# Patient Record
Sex: Male | Born: 1950 | Race: Black or African American | Hispanic: No | Marital: Married | Smoking: Former smoker
Health system: Southern US, Community
[De-identification: ages and names within clinical notes are randomized; demographics above are authoritative.]

## PROBLEM LIST (undated history)

## (undated) DIAGNOSIS — G3184 Mild cognitive impairment, so stated: Secondary | ICD-10-CM

## (undated) DIAGNOSIS — M545 Low back pain, unspecified: Secondary | ICD-10-CM

## (undated) DIAGNOSIS — I517 Cardiomegaly: Secondary | ICD-10-CM

## (undated) DIAGNOSIS — M5020 Other cervical disc displacement, unspecified cervical region: Secondary | ICD-10-CM

## (undated) DIAGNOSIS — K635 Polyp of colon: Secondary | ICD-10-CM

## (undated) DIAGNOSIS — I10 Essential (primary) hypertension: Secondary | ICD-10-CM

## (undated) DIAGNOSIS — R7303 Prediabetes: Secondary | ICD-10-CM

## (undated) DIAGNOSIS — I499 Cardiac arrhythmia, unspecified: Secondary | ICD-10-CM

## (undated) DIAGNOSIS — M48061 Spinal stenosis, lumbar region without neurogenic claudication: Secondary | ICD-10-CM

## (undated) DIAGNOSIS — N200 Calculus of kidney: Secondary | ICD-10-CM

## (undated) DIAGNOSIS — M47812 Spondylosis without myelopathy or radiculopathy, cervical region: Secondary | ICD-10-CM

## (undated) DIAGNOSIS — Z972 Presence of dental prosthetic device (complete) (partial): Secondary | ICD-10-CM

## (undated) DIAGNOSIS — G4730 Sleep apnea, unspecified: Secondary | ICD-10-CM

## (undated) DIAGNOSIS — W57XXXA Bitten or stung by nonvenomous insect and other nonvenomous arthropods, initial encounter: Secondary | ICD-10-CM

## (undated) DIAGNOSIS — E119 Type 2 diabetes mellitus without complications: Secondary | ICD-10-CM

## (undated) DIAGNOSIS — G4733 Obstructive sleep apnea (adult) (pediatric): Secondary | ICD-10-CM

## (undated) DIAGNOSIS — K259 Gastric ulcer, unspecified as acute or chronic, without hemorrhage or perforation: Secondary | ICD-10-CM

## (undated) DIAGNOSIS — C61 Malignant neoplasm of prostate: Secondary | ICD-10-CM

## (undated) DIAGNOSIS — N400 Enlarged prostate without lower urinary tract symptoms: Secondary | ICD-10-CM

## (undated) DIAGNOSIS — I639 Cerebral infarction, unspecified: Secondary | ICD-10-CM

## (undated) DIAGNOSIS — G8929 Other chronic pain: Secondary | ICD-10-CM

## (undated) DIAGNOSIS — I2510 Atherosclerotic heart disease of native coronary artery without angina pectoris: Secondary | ICD-10-CM

## (undated) DIAGNOSIS — B029 Zoster without complications: Secondary | ICD-10-CM

## (undated) DIAGNOSIS — J302 Other seasonal allergic rhinitis: Secondary | ICD-10-CM

## (undated) DIAGNOSIS — E7800 Pure hypercholesterolemia, unspecified: Secondary | ICD-10-CM

## (undated) DIAGNOSIS — E559 Vitamin D deficiency, unspecified: Secondary | ICD-10-CM

## (undated) HISTORY — DX: Pure hypercholesterolemia, unspecified: E78.00

## (undated) HISTORY — DX: Other seasonal allergic rhinitis: J30.2

## (undated) HISTORY — DX: Low back pain, unspecified: M54.50

## (undated) HISTORY — DX: Spinal stenosis, lumbar region without neurogenic claudication: M48.061

## (undated) HISTORY — DX: Other cervical disc displacement, unspecified cervical region: M50.20

## (undated) HISTORY — DX: Calculus of kidney: N20.0

## (undated) HISTORY — DX: Vitamin D deficiency, unspecified: E55.9

## (undated) HISTORY — PX: HERNIA REPAIR: SHX51

## (undated) HISTORY — DX: Cardiac arrhythmia, unspecified: I49.9

## (undated) HISTORY — DX: Mild cognitive impairment of uncertain or unknown etiology: G31.84

## (undated) HISTORY — PX: ANKLE SURGERY: SHX546

## (undated) HISTORY — DX: Gastric ulcer, unspecified as acute or chronic, without hemorrhage or perforation: K25.9

## (undated) HISTORY — DX: Obstructive sleep apnea (adult) (pediatric): G47.33

## (undated) HISTORY — DX: Cerebral infarction, unspecified: I63.9

## (undated) HISTORY — DX: Type 2 diabetes mellitus without complications: E11.9

## (undated) HISTORY — DX: Spondylosis without myelopathy or radiculopathy, cervical region: M47.812

## (undated) HISTORY — DX: Zoster without complications: B02.9

## (undated) HISTORY — DX: Polyp of colon: K63.5

## (undated) HISTORY — DX: Other chronic pain: G89.29

---

## 1998-05-05 ENCOUNTER — Ambulatory Visit (HOSPITAL_COMMUNITY): Admission: RE | Admit: 1998-05-05 | Discharge: 1998-05-05 | Payer: Self-pay | Admitting: Neurosurgery

## 1998-05-19 ENCOUNTER — Encounter: Admission: RE | Admit: 1998-05-19 | Discharge: 1998-08-17 | Payer: Self-pay | Admitting: Neurosurgery

## 2003-05-24 ENCOUNTER — Encounter (HOSPITAL_BASED_OUTPATIENT_CLINIC_OR_DEPARTMENT_OTHER): Payer: Self-pay | Admitting: General Surgery

## 2003-05-28 ENCOUNTER — Ambulatory Visit (HOSPITAL_COMMUNITY): Admission: RE | Admit: 2003-05-28 | Discharge: 2003-05-28 | Payer: Self-pay | Admitting: General Surgery

## 2003-12-24 ENCOUNTER — Encounter: Admission: RE | Admit: 2003-12-24 | Discharge: 2003-12-24 | Payer: Self-pay | Admitting: Internal Medicine

## 2005-06-22 ENCOUNTER — Encounter: Admission: RE | Admit: 2005-06-22 | Discharge: 2005-06-22 | Payer: Self-pay | Admitting: Internal Medicine

## 2005-09-21 ENCOUNTER — Observation Stay (HOSPITAL_COMMUNITY): Admission: EM | Admit: 2005-09-21 | Discharge: 2005-09-22 | Payer: Self-pay | Admitting: Emergency Medicine

## 2005-09-21 ENCOUNTER — Encounter (INDEPENDENT_AMBULATORY_CARE_PROVIDER_SITE_OTHER): Payer: Self-pay | Admitting: Cardiology

## 2005-10-09 ENCOUNTER — Ambulatory Visit (HOSPITAL_COMMUNITY): Admission: RE | Admit: 2005-10-09 | Discharge: 2005-10-09 | Payer: Self-pay | Admitting: Cardiology

## 2006-01-04 ENCOUNTER — Encounter: Admission: RE | Admit: 2006-01-04 | Discharge: 2006-01-04 | Payer: Self-pay | Admitting: Cardiology

## 2006-10-18 ENCOUNTER — Ambulatory Visit (HOSPITAL_COMMUNITY): Admission: RE | Admit: 2006-10-18 | Discharge: 2006-10-18 | Payer: Self-pay | Admitting: Neurosurgery

## 2006-12-13 ENCOUNTER — Ambulatory Visit (HOSPITAL_COMMUNITY): Admission: RE | Admit: 2006-12-13 | Discharge: 2006-12-13 | Payer: Self-pay | Admitting: Internal Medicine

## 2008-03-26 ENCOUNTER — Encounter: Admission: RE | Admit: 2008-03-26 | Discharge: 2008-03-26 | Payer: Self-pay | Admitting: Internal Medicine

## 2011-01-07 ENCOUNTER — Encounter: Payer: Self-pay | Admitting: Internal Medicine

## 2011-05-04 NOTE — Cardiovascular Report (Signed)
John Finley, John Finley NO.:  [id number]   MEDICAL RECORD NO.:  [id number]          PATIENT TYPE:  INP   LOCATION:  2912                         FACILITY:  [facility]   PHYSICIAN:  Cristy Hilts. Jacinto Halim, MD       DATE OF BIRTH:  1951/03/11   DATE OF PROCEDURE:  09/21/2005  DATE OF DISCHARGE:                              CARDIAC CATHETERIZATION   PROCEDURE PERFORMED:  1.  Left ventriculography.  2.  Selective right and left coronary arteriography.  3.  Abdominal aortogram and selective renal arteriography.  4.  Right femoral angiography and closure of right femoral artery access      using StarClose.   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  His risk factors included his  age and hypertension.  Because of abnormal EKG and also ongoing chest  discomfort, he was brought to the cardiac catheterization lab for definitive  diagnosis of coronary disease.  He also has hyperlipidemia.   HEMODYNAMIC DATA:  The left ventricular pressure was 95/0 with end-diastolic  pressure of 7 mmHg.  The aortic pressure was 95/61 with a mean of 73 mmHg.  There was no pressure gradient across the aortic valve.   ANGIOGRAPHIC DATA:  Left ventricle.  Left ventricular systolic function was  super normal with ejection fraction of 70%.  There was no significant mitral  regurgitation.   Right coronary artery.  Right coronary artery was a large caliber vessel and  a dominant vessel.  It has gotten mild to moderate diffuse luminal  irregularity, especially in the proximal segment.  The proximal segment has  40% narrowing, proximal to mid segment has a 40-50% narrowing at the origin  of the conus and RV branches.  The distal RCA has mild luminal irregularity  and gives rise into a  large PDA and a large PLA.   Left main.  Left main is a large caliber  vessel.  It is normal.   Left anterior descending artery.  Left anterior descending artery is a large  caliber vessel.  It has got mild luminal irregularity.  It gives rise into a  moderate to large sized diagonal-1 and several small diagonals.  It ends at  the apex.   Ramus intermedius.  The ramus intermedius is a very large caliber vessel and  is normal.   Circumflex coronary artery.  Circumflex coronary artery is a small to  moderate caliber vessel.  There is a mid 50-60% stenosis.  The mid segment  has moderate luminal irregularity.   Abdominal aortogram.  Abdominal aortogram revealed the presence of 2 renal  arteries, one on either side, that were not well visualized because of  minimal amount of contrast used.  Selective renal angiography revealed  widely patent renal arteries, one on either side.   IMPRESSION:  1.  No significant coronary artery disease by cardiac catheterization.      Intermediate lesions in the circumflex and also in the right coronary  artery.  The mid circumflex has 50% stenosis, but is a small caliber      vessel 50 to at most 60%, but is a small caliber vessel.  The right      coronary artery in the proximal to mid segment has moderate diffuse      luminal irregularity and has a diffuse 50, at most 60%, stenosis.  There      is also mild luminal irregularity in its distal right coronary artery.  2.  Dynamic left ventricle with ejection fraction of 70%.  No significant      mitral regurgitation.  3.  Widely patent renal arteries.   RECOMMENDATION:  Continue primary prevention, as indicated, with aggressive  control of his lipids with an LDL goal of less than or equal to 70 and also  the HDL goal of equal to or greater than 40-45 is indicated.  He will be  observed over night and his myocardial infarction will be ruled out.  He  will need continued risk factor modification with aggressive control of his  hypertension, his weight, exercise  profile.   TECHNIQUE OF THE PROCEDURE:  Under the usual sterile precautions, using a 6  French right femoral artery access, a 6 [place] multipurpose B2 catheter was  advanced into the ascending aorta with a 0.025 inch J wire.  The catheter  was gently advanced into the left ventricle, left ventricular pressures were  monitored.  The catheter was flushed with saline, pulled back into the  ascending aorta and pressure gradient across the aortic valve was monitored.  The right coronary artery was selectively engaged and angiography was  performed.  Then the catheter was pulled back into the abdominal aorta and  abdominal aortogram and selective right and left renal arteriography was  performed.  Then the catheter was pulled out of the body in the usual  fashion.  A 6 [place] JL4 diagnostic catheter was utilized to engage the left  main coronary artery and angiography was performed.  Then the catheter was  pulled out of the body in the usual fashion.  A right femoral angiography  was performed through the arterial access sheath and the access was closed  with StarClose, with excellent hemostasis.  The patient tolerated the  procedure well.  No immediate complications were noted.      Cristy Hilts. Jacinto Halim, MD  Electronically Signed     JRG/MEDQ  D:  09/21/2005  T:  09/21/2005  Job:  [id number]

## 2011-05-04 NOTE — Discharge Summary (Signed)
John Finley, John Finley              ACCOUNT NO.:  [id number]   MEDICAL RECORD NO.:  [id number]          PATIENT TYPE:  INP   LOCATION:  2030                         FACILITY:  [facility]   PHYSICIAN:  Dani Gobble, MD       DATE OF BIRTH:  March 08, 1951   DATE OF ADMISSION:  09/21/2005  DATE OF DISCHARGE:  09/22/2005                                 DISCHARGE SUMMARY   DISCHARGE DIAGNOSES:  1.  Chest pain.  Negative myocardial infarction.  Non-obstructive coronary      artery disease.  2.  Hypertension, uncontrolled initially then controlled at discharge.  3.  Hyperlipidemia.  4.  Hyperglycemia with mildly elevated glycose hemoglobin.  5.  Obstructive sleep apnea.  6.  2:1 block, asymptomatic.  7.  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   CONDITION ON DISCHARGE:  Stable.   PROCEDURES:  On September 21, 2005, combined left heart cath by Dr. Yates Decamp,  see dictated report.   DISCHARGE MEDICATIONS:  1.  Vytorin 10/40 one daily.  2.  Norvasc 10 mg daily.  3.  Diovan 320 mg with hydrochlorothiazide 12.5 mg one daily.  4.  Protonix 40 mg daily.  5.  Enteric coated aspirin 81 mg daily.  6.  Plavix 75 mg daily.   DISCHARGE INSTRUCTIONS:  1.  Low fat, low salt, diabetic diet.  2.  Wash cath site with soap and water, call with any bleeding, swelling, or      drainage.  3.  See Dr. Elsie Lincoln within the next 7 to 10 days, the office should call you      for an appointment.  4.  Follow up with Dr. Chestine Spore as needed.   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  Duration was approximately one hour.  It radiated to his left arm.  He had nausea, but no emesis.  Pain described  as a 3 out of one to 10, and perfuse diaphoresis.  The pain resolved either  just before or after his nitroglycerin.   He had a hollow sinking feeling as  well.  He came to the emergency room.  He had difficulty responding or  talking loudly to answer questions, but his EKG revealed ST elevation in V1  through V3 with possible subtle inferior lateral changes.  The patient  states he had a normal EKG for many, many years, but comparing to a baseline  EKG, Dr. Domingo Sep who was the on-call physician, felt there were significant  changes.  They repeated the EKG in the ER and there was some improvement,  but not to baseline.  The chest pain in the emergency room had improved, but  his risk factors for cardiac disease include hypertension, hyperglycemia,  and elevated lipids.  The patient was taken emergently to the cath lab by  Dr. Jacinto Halim to evaluate his coronary status.   PAST MEDICAL HISTORY:  1.  Cervical and lumbar disk disease.  2.  History of gastric ulcer and a polypectomy in the past.  3.  Right inguinal hernia repair.   ALLERGIES:  1.  PENICILLIN.  2.  SULFA.   OUTPATIENT MEDICATIONS:  1.  Norvasc 10 mg.  2.  Diovan 320 mg.   SOCIAL HISTORY:  Married with two children.  He is a cardiologist.  Remote  tobacco use, no alcohol, no illicit drugs.   FAMILY HISTORY:  Positive for diabetes.   REVIEW OF SYSTEMS:  See H&P.   DISCHARGE PHYSICAL EXAMINATION:  Blood pressure 144/92, pulse 82,  respirations stable.   LABORATORY DATA:  Admitting labs:  Hemoglobin 14.5, hematocrit 43.2, WBC  12.4, MCV 89.9, platelets 253.  Followup hemoglobin 13.2, hematocrit 38.5,  WBC 13.6.   Prothrombin time 13.8, INR of 1, PTT 26.   Chemistries:  Sodium 140, potassium 3.4, potassium was replaced and followup  was 4.2, chloride 106, CO2 of 24, glucose 144, BUN 17, creatinine 1.3,  calcium 8.6, magnesium 2.4.   Cardiac enzymes ranged 325, 4.6 MB's, and 0.05 troponin.  Followup CK 269,  MB 4.3, troponin 0.07.  Negative MI.  Mild bump in troponin could be related  to his hypertension versus mild spasm, as well as  index was all negative.   Please note also prior to discharge, his white count was 9.4, hemoglobin  13.9, hematocrit 41.5, and platelets 248.  Glucose remained elevated at 147,  144, and 145 for a.m. labs.  Creatinine was stable at 1.2.  On initial  admission it was 1.6 on ISTAT, but it improved the next day.   Magnesium level was 2.4.  Total cholesterol 146, triglycerides 119, HDL 30,  LDL 92.  TSH was 2.439.   The patient underwent carotid Doppler's, no hemodynamically significant ICA  stenosis, vertebral artery flow was antegrade, left 40 to 60% ICA stenosis.   A 2-D echocardiogram was done.  Overall LV systolic function was vigorous,  left ventricular EF was 65 to 75%, no left ventricular regional wall motion  abnormalities, aortic valve thickness was mildly increased, the mean  transaortic valve gradient was 7 mmHg.  Left atrial size was upper limits of  normal.   CT scan of the chest:  No acute chest findings were demonstrated, no  evidence of aortic dissection, prominent thymus with apparent small thymic  mass, this may reflect a thymoma, lymphoproliferative process would be  another possibility, and there is a prominent subcarinal node.  There is  evidence of prior granulomas disease with calcified lymph nodes in the pre-  vascular space and left hilum.  Chest x-ray left lower lung atelectasis.   Cardiac catheterization revealed non-obstructive coronary disease, mid  circumflex with a 50% stenosis, but it is a small caliber vessel, 50 to 60  the right coronary artery in the proximal to mid segment has moderate  diffuse luminal irregularity, and a diffuse 50 to maybe 60% stenosis.  There  is also mild luminal irregularity in its distal RCA.  Dynamic LV with an EF  of 70%, no significant MR, widely patent renal arteries.   HOSPITAL COURSE:  The patient was seen in the emergency room by Dr. Domingo Sep who was on-call physician.  Due to abnormal EKG and chest pain, the patient   was taken to the cath lab by Dr. Yates Decamp where he was found to have non-  obstructive coronary disease.  Blood pressure was significantly elevated on  admission.  Given IV medications with control  of his pressure.  By the next  day, he was improving, but due to mildly elevated troponin, he was kept  overnight with plans to discharge on September 22, 2005.   At 11 p.m. on September 21, 2005, the patient had some 2:1 block which he  believes he has had before.  Therefore, his beta blocker was discontinued  that he had been placed on.   Prior to discharge, Dr. Allyson Sabal discussed his 2:1 block with the patient, the  need for monitoring his glucose level and his lipids, and his blood pressure  with the stopping of his beta blocker.  We have added hydrochlorothiazide to  his medical regimen.  He will follow up in the office with Dr. Elsie Lincoln in 7  to 10 days for further evaluation of his pressure and to address the thymus  mass.      Darcella Gasman. Annie Paras, N.P.    ______________________________  Dani Gobble, MD    LRI/MEDQ  D:  09/23/2005  T:  09/23/2005  Job:  [id number]   cc:   Madaline Savage, M.D.  Fax: [phone]   Margaretmary Bayley, M.D.  Fax: [phone]

## 2011-05-04 NOTE — Op Note (Signed)
NAME:  John Finley, John Finley                        ACCOUNT NO.:  [id number]   MEDICAL RECORD NO.:  [id number]                   PATIENT TYPE:  AMB   LOCATION:  DAY                                  FACILITY:  [facility]   PHYSICIAN:  Leonie Man, M.D.                DATE OF BIRTH:  February 15, 1951   DATE OF PROCEDURE:  05/28/2003  DATE OF DISCHARGE:                                 OPERATIVE REPORT   PREOPERATIVE DIAGNOSIS:  Right inguinal hernia.   POSTOPERATIVE DIAGNOSIS:  Right direct inguinal hernia.   PROCEDURE:  Right inguinal herniorrhaphy with mesh and repair.   SURGEON:  Leonie Man, M.D.   ASSISTANT:  Nurse.   ANESTHESIA:  General.   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  He comes to the operating room after discovering a  right groin bulge, which on evaluation is a right inguinal hernia.  The  risks and potential benefits of surgery have been fully discussed.  All  questions are answered and consent obtained.   DESCRIPTION OF PROCEDURE:  Following the induction of satisfactory general  anesthesia with the patient was positioned supinely, the lower abdomen is  prepped and draped to be included in a sterile operative field.  The region  of the lower abdominal crease is infiltrated with 1% lidocaine with  epinephrine.  A transverse incision is made in the lower abdominal crease,  with dissection carried down to the external oblique aponeurosis.  This was  opened up through the external inguinal ring, with protection of the  ilioinguinal nerve, which is retracted medially and cephalad.  The spermatic  cord is elevated from the floor and held with a Penrose drain.  The  spermatic cord is dissected on its anteromedial aspect, up to the internal  ring without discovery of an indirect hernial sac.  The direct space showed  a moderate-sized direct  inguinal hernia, extending through the entire  portion of Hesselbach's triangle.  This was repaired and with a mesh patch  of polypropylene, sewn in at the pubic tubercle with a 2-0 Novofil and  carried up along the conjoined tendon to the internal ring.  Again, from the  pubic tubercle up along the shelving edge of Poupart's ligament up to the  internal ring.  The mesh is fit so as to allow normal admission of the cord,  the tabs of the mesh were trimmed and the tabs sutured down behind the cord  and the internal thick muscles.  The resulting renewed internal ring was  snug, but not tight.  The floor of the inguinal canal appeared to be well  repaired.  Sponge, instrument and sharp counts were verified, and the wounds  closed in layers as follows.   The spermatic cord returned to its normal anatomic position, and the  external oblique aponeurosis closed over the cord with a running 2-0 Vicryl  suture; thus, reapproximating the external ring.  Subcutaneous tissues  showed no evidence of bleeding, and Scarpa's fascia was closed with a  running suture of 3-0 Vicryl.  The skin was then closed  with running 4-0 Monocryl suture, and then reinforced with Steri-Strips.  A  sterile compressive dressing was applied, the anesthetic reversed and the  patient removed from the operating room to the recovery room in stable  condition.  He tolerated the procedure well.                                               Leonie Man, M.D.    PB/MEDQ  D:  05/28/2003  T:  05/28/2003  Job:  [id number]

## 2011-06-26 ENCOUNTER — Other Ambulatory Visit: Payer: Self-pay | Admitting: Orthopedic Surgery

## 2011-06-26 ENCOUNTER — Other Ambulatory Visit: Payer: Self-pay

## 2011-06-26 ENCOUNTER — Ambulatory Visit
Admission: RE | Admit: 2011-06-26 | Discharge: 2011-06-26 | Disposition: A | Discharge status: Home / Self Care | Payer: BC Managed Care – PPO | Source: Ambulatory Visit | Attending: Orthopedic Surgery | Admitting: Orthopedic Surgery

## 2011-06-26 DIAGNOSIS — M79673 Pain in unspecified foot: Secondary | ICD-10-CM

## 2011-06-26 DIAGNOSIS — T1490XA Injury, unspecified, initial encounter: Secondary | ICD-10-CM

## 2011-07-02 ENCOUNTER — Other Ambulatory Visit (HOSPITAL_COMMUNITY): Payer: BC Managed Care – PPO

## 2011-07-05 ENCOUNTER — Encounter (HOSPITAL_COMMUNITY)
Admission: RE | Admit: 2011-07-05 | Discharge: 2011-07-05 | Disposition: A | Discharge status: Home / Self Care | Payer: BC Managed Care – PPO | Source: Ambulatory Visit | Attending: Orthopedic Surgery | Admitting: Orthopedic Surgery

## 2011-07-05 ENCOUNTER — Ambulatory Visit (HOSPITAL_COMMUNITY)
Admission: RE | Admit: 2011-07-05 | Discharge: 2011-07-05 | Disposition: A | Discharge status: Home / Self Care | Payer: BC Managed Care – PPO | Source: Ambulatory Visit | Attending: Orthopedic Surgery | Admitting: Orthopedic Surgery

## 2011-07-05 ENCOUNTER — Other Ambulatory Visit (HOSPITAL_COMMUNITY): Payer: Self-pay | Admitting: Orthopedic Surgery

## 2011-07-05 DIAGNOSIS — S92002A Unspecified fracture of left calcaneus, initial encounter for closed fracture: Secondary | ICD-10-CM

## 2011-07-05 DIAGNOSIS — Z01818 Encounter for other preprocedural examination: Secondary | ICD-10-CM | POA: Insufficient documentation

## 2011-07-05 DIAGNOSIS — S92009A Unspecified fracture of unspecified calcaneus, initial encounter for closed fracture: Secondary | ICD-10-CM | POA: Insufficient documentation

## 2011-07-05 DIAGNOSIS — Z01812 Encounter for preprocedural laboratory examination: Secondary | ICD-10-CM | POA: Insufficient documentation

## 2011-07-05 DIAGNOSIS — X58XXXA Exposure to other specified factors, initial encounter: Secondary | ICD-10-CM | POA: Insufficient documentation

## 2011-07-05 DIAGNOSIS — Z01810 Encounter for preprocedural cardiovascular examination: Secondary | ICD-10-CM | POA: Insufficient documentation

## 2011-07-05 LAB — URINALYSIS, ROUTINE W REFLEX MICROSCOPIC
Bilirubin Urine: NEGATIVE
Glucose, UA: NEGATIVE mg/dL
Hgb urine dipstick: NEGATIVE
Ketones, ur: 15 mg/dL — AB
Leukocytes, UA: NEGATIVE
Nitrite: NEGATIVE
Protein, ur: NEGATIVE mg/dL
Specific Gravity, Urine: 1.033 — ABNORMAL HIGH (ref 1.005–1.030)
Urobilinogen, UA: 1 mg/dL (ref 0.0–1.0)
pH: 5.5 (ref 5.0–8.0)

## 2011-07-05 LAB — PROTIME-INR
INR: 1.03 (ref 0.00–1.49)
Prothrombin Time: 13.7 seconds (ref 11.6–15.2)

## 2011-07-05 LAB — COMPREHENSIVE METABOLIC PANEL
ALT: 32 U/L (ref 0–53)
AST: 24 U/L (ref 0–37)
Albumin: 4.1 g/dL (ref 3.5–5.2)
Alkaline Phosphatase: 90 U/L (ref 39–117)
BUN: 17 mg/dL (ref 6–23)
CO2: 28 mEq/L (ref 19–32)
Calcium: 9.8 mg/dL (ref 8.4–10.5)
Chloride: 102 mEq/L (ref 96–112)
Creatinine, Ser: 1.33 mg/dL (ref 0.50–1.35)
GFR calc Af Amer: >60 mL/min (ref >60)
GFR calc non Af Amer: 55 mL/min — ABNORMAL LOW (ref >60)
Glucose, Bld: 87 mg/dL (ref 70–99)
Potassium: 4.2 mEq/L (ref 3.5–5.1)
Sodium: 140 mEq/L (ref 135–145)
Total Bilirubin: 0.5 mg/dL (ref 0.3–1.2)
Total Protein: 7.7 g/dL (ref 6.0–8.3)

## 2011-07-05 LAB — DIFFERENTIAL
Basophils Absolute: 0 10*3/uL (ref 0.0–0.1)
Basophils Relative: 0 % (ref 0–1)
Eosinophils Absolute: 0.2 10*3/uL (ref 0.0–0.7)
Eosinophils Relative: 2 % (ref 0–5)
Lymphocytes Relative: 28 % (ref 12–46)
Lymphs Abs: 2.3 10*3/uL (ref 0.7–4.0)
Monocytes Absolute: 0.6 10*3/uL (ref 0.1–1.0)
Monocytes Relative: 7 % (ref 3–12)
Neutro Abs: 5.2 10*3/uL (ref 1.7–7.7)
Neutrophils Relative %: 62 % (ref 43–77)

## 2011-07-05 LAB — TYPE AND SCREEN
ABO/RH(D): O POS
Antibody Screen: NEGATIVE

## 2011-07-05 LAB — CBC
HCT: 41.5 % (ref 39.0–52.0)
Hemoglobin: 14.1 g/dL (ref 13.0–17.0)
MCH: 29.3 pg (ref 26.0–34.0)
MCHC: 34 g/dL (ref 30.0–36.0)
MCV: 86.1 fL (ref 78.0–100.0)
Platelets: 344 10*3/uL (ref 150–400)
RBC: 4.82 MIL/uL (ref 4.22–5.81)
RDW: 14.3 % (ref 11.5–15.5)
WBC: 8.3 10*3/uL (ref 4.0–10.5)

## 2011-07-05 LAB — SURGICAL PCR SCREEN
MRSA, PCR: NEGATIVE
Staphylococcus aureus: NEGATIVE

## 2011-07-05 LAB — ABO/RH: ABO/RH(D): O POS

## 2011-07-06 LAB — URINE CULTURE

## 2011-07-10 ENCOUNTER — Inpatient Hospital Stay (HOSPITAL_COMMUNITY): Payer: BC Managed Care – PPO

## 2011-07-10 ENCOUNTER — Inpatient Hospital Stay (HOSPITAL_COMMUNITY)
Admission: RE | Admit: 2011-07-10 | Discharge: 2011-07-12 | DRG: 225 | Disposition: A | Discharge status: Home / Self Care | Payer: BC Managed Care – PPO | Source: Ambulatory Visit | Attending: Orthopedic Surgery | Admitting: Orthopedic Surgery

## 2011-07-10 DIAGNOSIS — S92009A Unspecified fracture of unspecified calcaneus, initial encounter for closed fracture: Principal | ICD-10-CM | POA: Diagnosis present

## 2011-07-10 DIAGNOSIS — I2510 Atherosclerotic heart disease of native coronary artery without angina pectoris: Secondary | ICD-10-CM | POA: Diagnosis present

## 2011-07-10 DIAGNOSIS — F410 Panic disorder [episodic paroxysmal anxiety] without agoraphobia: Secondary | ICD-10-CM | POA: Diagnosis present

## 2011-07-10 DIAGNOSIS — E785 Hyperlipidemia, unspecified: Secondary | ICD-10-CM | POA: Diagnosis present

## 2011-07-10 DIAGNOSIS — Y998 Other external cause status: Secondary | ICD-10-CM

## 2011-07-10 DIAGNOSIS — G4733 Obstructive sleep apnea (adult) (pediatric): Secondary | ICD-10-CM | POA: Diagnosis present

## 2011-07-10 DIAGNOSIS — E669 Obesity, unspecified: Secondary | ICD-10-CM | POA: Diagnosis present

## 2011-07-10 DIAGNOSIS — I119 Hypertensive heart disease without heart failure: Secondary | ICD-10-CM | POA: Diagnosis present

## 2011-07-11 ENCOUNTER — Inpatient Hospital Stay (HOSPITAL_COMMUNITY): Payer: BC Managed Care – PPO

## 2011-07-11 LAB — CBC
HCT: 38.2 % — ABNORMAL LOW (ref 39.0–52.0)
Hemoglobin: 12.8 g/dL — ABNORMAL LOW (ref 13.0–17.0)
MCH: 28.8 pg (ref 26.0–34.0)
MCV: 86 fL (ref 78.0–100.0)
Platelets: 315 10*3/uL (ref 150–400)
RBC: 4.44 MIL/uL (ref 4.22–5.81)
WBC: 11.6 10*3/uL — ABNORMAL HIGH (ref 4.0–10.5)

## 2011-07-11 LAB — BASIC METABOLIC PANEL
BUN: 10 mg/dL (ref 6–23)
CO2: 28 mEq/L (ref 19–32)
Calcium: 9.3 mg/dL (ref 8.4–10.5)
Chloride: 99 mEq/L (ref 96–112)
Creatinine, Ser: 1 mg/dL (ref 0.50–1.35)
Glucose, Bld: 101 mg/dL — ABNORMAL HIGH (ref 70–99)

## 2011-07-11 LAB — CARDIAC PANEL(CRET KIN+CKTOT+MB+TROPI)
Relative Index: 1.1 (ref 0.0–2.5)
Total CK: 437 U/L — ABNORMAL HIGH (ref 7–232)

## 2011-07-11 MED ORDER — IOHEXOL 350 MG/ML SOLN
180.0000 mL | Freq: Once | INTRAVENOUS | Status: AC | PRN
  Administered 2011-07-11: 180 mL via INTRAVENOUS

## 2011-08-01 NOTE — Consult Note (Signed)
John Finley, John Finley NO.:  [id number]  MEDICAL RECORD NO.:  [id number]  LOCATION:  5005                         FACILITY:  [facility]  PHYSICIAN:  Pamella Pert, MD DATE OF BIRTH:  03/31/1951  DATE OF CONSULTATION:  07/11/2011 DATE OF DISCHARGE:                                CONSULTATION   REASON FOR CONSULTATION:  Shortness of breath.  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  He has longstanding history of hypertension, hyperlipidemia, obesity and obstructive sleep apnea.  He had injured his left foot while jumping off of the boat on June 20, 2011.  Since then he has been having significant difficulty in his left leg and eventually underwent open reduction and internal fixation of his left foot yesterday by Dr. Myrene Galas.  He had been doing well since his fall, has noticed worsening shortness of breath which he described as very mild.  In fact he has been propping up himself trying to sleep at night at home.  He denied any chest pain, denied any palpitations, denied any syncope or hemoptysis.  Last night he noticed that he was mildly more short of breath than usual and also was found to be tachycardic with a heart rate around 120s to 130s per minute.  Given this he was concerned and because of worsening shortness of breath this morning, he had called me to evaluate him further.  REVIEW OF SYSTEMS:  He denies any bowel or bladder disturbances, although he does have gastric polyps.  No history of GI bleed.  He does have GERD.  He is hypertensive.  He is not a diabetic.  There is no significant bladder disturbances.  There is no neurological weaknesses. There is no recent weight changes.  He does have sleep apnea and uses CPAP at home but not on a regular basis.  Other systems are negative.  His past medical history is significant for hypertension, hyperlipidemia, obesity, sleep apnea and known coronary artery  disease by cardiac catheterization in 2006 showing 50-60% stenosis in the proximal and mid RCA and mid circumflex coronary artery.  FAMILY HISTORY:  There is no history of premature coronary artery disease in family.  SOCIAL HISTORY:  He is married, lives with his wife.  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  He does not have any exercise program, has relatively sedentary lifestyle.  His home medications include amlodipine 10 mg p.o. daily, Benicar 40 mg p.o. daily, Protonix 40 mg p.o. daily.  ALLERGIES:  No known drug allergies.  On physical exam, he is well-built, obese.  He appears to be in no acute distress.  His vital signs includes a temperature of 98.3, pulse is 90 beats per minute, regular respiration 16, blood pressure 159/82 mmHg. Cardiac exam, S1-S2 is normal.  There is S4 gallop heard.  There is a 2/6 systolic ejection murmur heard in the apex and right sternal border. Chest examination revealed bilateral equal breath sounds without any rhonchi.  Abdomen is obese.  No organomegaly.  Bowel sounds heard in all 4 quadrants.  There is no tenderness.  Extremities full range of movements without any edema.  Left foot  is in the cast.  Peripheral arterial exam, there is faint bilateral carotid artery bruit.  There is faint right femoral bruit with 3+ pulses.  Popliteals were 2+ bilaterally.  Dorsalis pedis and posterior tibial was 2+ on the right, unable to feel on the left due to cast.  EKG demonstrates sinus rhythm, left ventricular hypertrophy with repolarization abnormality with nonspecific T-wave changes.  His CBC and BMP are within normal limits.  Hemoglobin included a hemoglobin of 12.8, hematocrit of 38.2, white count was 11.6, platelet count was 315,000.  His serum creatinine is normal.  IMPRESSION: 1. Shortness of breath, probably secondary to uncontrolled     hypertension with hypertensive heart disease.  He has S4 gallop on     physical  examination. 2. Pulmonary embolism should be ruled out in a patient who has had     injury to his left foot on June 20, 2011.  A DVT with pulmonary     embolism hence cannot be excluded.  He has been scheduled for CT     angiography of the chest this afternoon. 3. Hypertension. 4. Hyperlipidemia, not on therapy. 5. Coronary artery disease with 50-60% stenosis in the mid circumflex     coronary artery and also proximal and mid right coronary artery in     2006. 6. Obesity with obstructive sleep apnea, on CPAP.  RECOMMENDATIONS:  I agreed with obtaining CT angiography of the chest to exclude pulmonary embolism.  I will also start the patient on aspirin for coronary artery disease prophylaxis.  We will also add a low-dose of a beta-blocker given his elevated heart rate and uncontrolled blood pressure to his present medical regimen for cardiovascular protection. He has been intolerant to statins but is willing to try Livalo which I will start at 2 mg once a day.  I will obtain an echocardiogram to evaluate his LV systolic function and also to evaluate his murmur.  I will continue to make an update.     Pamella Pert, MD     JRG/MEDQ  D:  07/11/2011  T:  07/12/2011  Job:  [id number]  Electronically Signed by Yates Decamp MD on 08/01/2011 06:47:11 PM
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
## 2011-09-04 ENCOUNTER — Ambulatory Visit
Payer: BC Managed Care – PPO | Attending: Orthopedic Surgery | Admitting: Rehabilitative and Restorative Service Providers"

## 2011-09-04 DIAGNOSIS — R262 Difficulty in walking, not elsewhere classified: Secondary | ICD-10-CM | POA: Insufficient documentation

## 2011-09-04 DIAGNOSIS — M6281 Muscle weakness (generalized): Secondary | ICD-10-CM | POA: Insufficient documentation

## 2011-09-04 DIAGNOSIS — IMO0001 Reserved for inherently not codable concepts without codable children: Secondary | ICD-10-CM | POA: Insufficient documentation

## 2011-09-04 DIAGNOSIS — M25579 Pain in unspecified ankle and joints of unspecified foot: Secondary | ICD-10-CM | POA: Insufficient documentation

## 2011-09-11 ENCOUNTER — Ambulatory Visit: Payer: BC Managed Care – PPO | Admitting: Physical Therapy

## 2011-09-13 ENCOUNTER — Ambulatory Visit: Payer: BC Managed Care – PPO | Admitting: Rehabilitation

## 2011-09-17 ENCOUNTER — Ambulatory Visit: Payer: BC Managed Care – PPO | Admitting: Rehabilitation

## 2011-09-19 ENCOUNTER — Encounter: Payer: Self-pay | Admitting: Rehabilitative and Restorative Service Providers"

## 2011-09-25 ENCOUNTER — Ambulatory Visit: Payer: BC Managed Care – PPO | Attending: Orthopedic Surgery | Admitting: Rehabilitation

## 2011-09-25 DIAGNOSIS — R262 Difficulty in walking, not elsewhere classified: Secondary | ICD-10-CM | POA: Insufficient documentation

## 2011-09-25 DIAGNOSIS — M25579 Pain in unspecified ankle and joints of unspecified foot: Secondary | ICD-10-CM | POA: Insufficient documentation

## 2011-09-25 DIAGNOSIS — IMO0001 Reserved for inherently not codable concepts without codable children: Secondary | ICD-10-CM | POA: Insufficient documentation

## 2011-09-25 DIAGNOSIS — M6281 Muscle weakness (generalized): Secondary | ICD-10-CM | POA: Insufficient documentation

## 2011-10-03 ENCOUNTER — Ambulatory Visit: Payer: BC Managed Care – PPO | Admitting: Rehabilitation

## 2011-10-09 ENCOUNTER — Ambulatory Visit: Payer: BC Managed Care – PPO | Admitting: Rehabilitative and Restorative Service Providers"

## 2013-10-12 ENCOUNTER — Other Ambulatory Visit (HOSPITAL_COMMUNITY): Payer: Self-pay | Admitting: Internal Medicine

## 2013-10-12 ENCOUNTER — Other Ambulatory Visit: Payer: Self-pay | Admitting: Internal Medicine

## 2013-10-12 DIAGNOSIS — R109 Unspecified abdominal pain: Secondary | ICD-10-CM

## 2013-10-12 DIAGNOSIS — I10 Essential (primary) hypertension: Secondary | ICD-10-CM

## 2013-10-12 DIAGNOSIS — I739 Peripheral vascular disease, unspecified: Secondary | ICD-10-CM

## 2013-10-15 ENCOUNTER — Ambulatory Visit (HOSPITAL_COMMUNITY)
Admission: RE | Admit: 2013-10-15 | Discharge: 2013-10-15 | Disposition: A | Discharge status: Home / Self Care | Payer: BC Managed Care – PPO | Source: Ambulatory Visit | Attending: Internal Medicine | Admitting: Internal Medicine

## 2013-10-15 DIAGNOSIS — I739 Peripheral vascular disease, unspecified: Secondary | ICD-10-CM

## 2013-10-15 DIAGNOSIS — R0989 Other specified symptoms and signs involving the circulatory and respiratory systems: Secondary | ICD-10-CM | POA: Insufficient documentation

## 2013-10-15 DIAGNOSIS — I10 Essential (primary) hypertension: Secondary | ICD-10-CM

## 2013-10-15 DIAGNOSIS — M79609 Pain in unspecified limb: Secondary | ICD-10-CM | POA: Insufficient documentation

## 2013-10-16 ENCOUNTER — Ambulatory Visit (HOSPITAL_COMMUNITY)
Admission: RE | Admit: 2013-10-16 | Discharge: 2013-10-16 | Disposition: A | Discharge status: Home / Self Care | Payer: BC Managed Care – PPO | Source: Ambulatory Visit | Attending: Internal Medicine | Admitting: Internal Medicine

## 2013-10-16 ENCOUNTER — Ambulatory Visit (HOSPITAL_COMMUNITY): Payer: BC Managed Care – PPO

## 2013-10-16 DIAGNOSIS — R109 Unspecified abdominal pain: Secondary | ICD-10-CM

## 2014-06-13 ENCOUNTER — Other Ambulatory Visit: Payer: Self-pay | Admitting: Cardiology

## 2014-06-24 ENCOUNTER — Ambulatory Visit
Admission: RE | Admit: 2014-06-24 | Discharge: 2014-06-24 | Disposition: A | Discharge status: Home / Self Care | Payer: BC Managed Care – PPO | Source: Ambulatory Visit | Attending: Gastroenterology | Admitting: Gastroenterology

## 2014-06-24 ENCOUNTER — Other Ambulatory Visit: Payer: Self-pay | Admitting: Gastroenterology

## 2014-06-24 DIAGNOSIS — R1011 Right upper quadrant pain: Secondary | ICD-10-CM

## 2014-06-24 DIAGNOSIS — R1013 Epigastric pain: Secondary | ICD-10-CM

## 2016-03-09 ENCOUNTER — Ambulatory Visit
Admission: RE | Admit: 2016-03-09 | Discharge: 2016-03-09 | Disposition: A | Discharge status: Home / Self Care | Payer: Self-pay | Source: Ambulatory Visit | Attending: Gastroenterology | Admitting: Gastroenterology

## 2016-03-09 ENCOUNTER — Other Ambulatory Visit: Payer: Self-pay | Admitting: Gastroenterology

## 2016-03-09 DIAGNOSIS — K5641 Fecal impaction: Secondary | ICD-10-CM

## 2016-10-05 ENCOUNTER — Encounter (HOSPITAL_BASED_OUTPATIENT_CLINIC_OR_DEPARTMENT_OTHER): Payer: Self-pay | Admitting: *Deleted

## 2016-10-05 ENCOUNTER — Emergency Department (HOSPITAL_BASED_OUTPATIENT_CLINIC_OR_DEPARTMENT_OTHER)
Admission: EM | Admit: 2016-10-05 | Discharge: 2016-10-06 | Disposition: A | Discharge status: Home / Self Care | Payer: 59 | Attending: Emergency Medicine | Admitting: Emergency Medicine

## 2016-10-05 DIAGNOSIS — H9203 Otalgia, bilateral: Secondary | ICD-10-CM | POA: Diagnosis present

## 2016-10-05 DIAGNOSIS — I10 Essential (primary) hypertension: Secondary | ICD-10-CM | POA: Diagnosis not present

## 2016-10-05 DIAGNOSIS — Z79899 Other long term (current) drug therapy: Secondary | ICD-10-CM | POA: Insufficient documentation

## 2016-10-05 DIAGNOSIS — H6123 Impacted cerumen, bilateral: Secondary | ICD-10-CM | POA: Diagnosis not present

## 2016-10-05 HISTORY — DX: Essential (primary) hypertension: I10

## 2016-10-05 NOTE — ED Notes (Signed)
Diff hearing out of left ear after using bulb syringe to clean ear

## 2016-10-05 NOTE — ED Triage Notes (Signed)
Left ear fullness. He can't hear out of it since using a bulb syringe to remove wax earlier tonight.

## 2016-10-05 NOTE — ED Provider Notes (Addendum)
[place] [facility] Provider Note   CSN: [id number] Arrival date & time: 10/05/16  2215   By signing my name below, I, Macon Large, attest that this documentation has been prepared under the direction and in the presence of Dorie Rank, MD. Electronically Signed: Macon Large, ED Scribe. 10/05/16. 12:00 AM.   History   Chief Complaint Chief Complaint  Patient presents with  . Otalgia   HPI  HPI Comments: John Finley is a 65 y.o. male who presents to the Emergency Department complaining of sudden onset left ear fullness earlier tonight. He reports Pt states he is unable to hear out of left ear due to earwax. He notes using a bulb syringe to clean left ear out. No additional complaints at this time.   Past Medical History:  Diagnosis Date  . Hypertension     There are no active problems to display for this patient.   Past Surgical History:  Procedure Laterality Date  . ANKLE SURGERY         Home Medications    Prior to Admission medications   Medication Sig Start Date End Date Taking? Authorizing Provider  amLODipine-valsartan (EXFORGE) 10-320 MG tablet Take 1 tablet by mouth daily.   Yes Historical Provider, MD    Family History No family history on file.  Social History Social History  Substance Use Topics  . Smoking status: Never Smoker  . Smokeless tobacco: Never Used  . Alcohol use No     Allergies   Penicillins and Sulfa antibiotics   Review of Systems Review of Systems  Constitutional: Negative for fever.  HENT: Positive for hearing loss. Negative for ear pain.   Respiratory: Negative for shortness of breath.      Physical Exam Updated Vital Signs BP 173/98   Pulse 76   Temp 97.9 F (36.6 C) (Oral)   Resp 20   Ht 5\' 6"  (1.676 m)   Wt 99.8 kg   SpO2 98%   BMI 35.51 kg/m   Physical Exam  Constitutional: He appears well-developed and well-nourished. No distress.  HENT:  Head: Normocephalic and atraumatic.  Right  Ear: External ear normal.  Left Ear: External ear normal.  Bilateral cerumen impaction  Eyes: Conjunctivae are normal. Right eye exhibits no discharge. Left eye exhibits no discharge. No scleral icterus.  Neck: Neck supple. No tracheal deviation present.  Cardiovascular: Normal rate.   Pulmonary/Chest: Effort normal. No stridor. No respiratory distress.  Abdominal: He exhibits no distension.  Musculoskeletal: He exhibits no edema.  Neurological: He is alert. Cranial nerve deficit: no gross deficits.  Skin: Skin is warm and dry. No rash noted.  Psychiatric: He has a normal mood and affect.  Nursing note and vitals reviewed.    ED Treatments / Results    Procedures Procedures (including critical care time)    Initial Impression / Assessment and Plan / ED Course  I have reviewed the triage vital signs and the nursing notes.  Pertinent labs & imaging results that were available during my care of the patient were reviewed by me and considered in my medical decision making (see chart for details).  Clinical Course  Ear irrigated by RN Coble.  Sx improved after treatment.  Pt still has wax in bilateral canals but he is hearing better and would prefer to go home.  Final Clinical Impressions(s) / ED Diagnoses   Final diagnoses:  Bilateral impacted cerumen    New Prescriptions New Prescriptions   No medications on file  I personally performed the services described in this documentation, which was scribed in my presence.  The recorded information has been reviewed and is accurate.    Dorie Rank, MD 10/06/16 0001    Dorie Rank, MD 10/06/16 [phone]

## 2016-10-06 NOTE — ED Notes (Signed)
Pt states can hear and feels a lot better

## 2016-12-17 DIAGNOSIS — I639 Cerebral infarction, unspecified: Secondary | ICD-10-CM

## 2016-12-17 HISTORY — DX: Cerebral infarction, unspecified: I63.9

## 2017-02-16 ENCOUNTER — Emergency Department (HOSPITAL_COMMUNITY): Payer: 59

## 2017-02-16 ENCOUNTER — Other Ambulatory Visit (HOSPITAL_COMMUNITY): Payer: Self-pay | Admitting: Radiology

## 2017-02-16 ENCOUNTER — Observation Stay (HOSPITAL_COMMUNITY): Payer: 59

## 2017-02-16 ENCOUNTER — Encounter (HOSPITAL_COMMUNITY): Payer: Self-pay | Admitting: *Deleted

## 2017-02-16 ENCOUNTER — Inpatient Hospital Stay (HOSPITAL_COMMUNITY)
Admission: EM | Admit: 2017-02-16 | Discharge: 2017-02-19 | DRG: 066 | Disposition: A | Discharge status: Home / Self Care | Payer: 59 | Attending: Internal Medicine | Admitting: Internal Medicine

## 2017-02-16 DIAGNOSIS — R748 Abnormal levels of other serum enzymes: Secondary | ICD-10-CM | POA: Diagnosis present

## 2017-02-16 DIAGNOSIS — Z9889 Other specified postprocedural states: Secondary | ICD-10-CM

## 2017-02-16 DIAGNOSIS — Z882 Allergy status to sulfonamides status: Secondary | ICD-10-CM

## 2017-02-16 DIAGNOSIS — Z87891 Personal history of nicotine dependence: Secondary | ICD-10-CM

## 2017-02-16 DIAGNOSIS — M5030 Other cervical disc degeneration, unspecified cervical region: Secondary | ICD-10-CM | POA: Diagnosis present

## 2017-02-16 DIAGNOSIS — Z79899 Other long term (current) drug therapy: Secondary | ICD-10-CM

## 2017-02-16 DIAGNOSIS — R7989 Other specified abnormal findings of blood chemistry: Secondary | ICD-10-CM

## 2017-02-16 DIAGNOSIS — I160 Hypertensive urgency: Secondary | ICD-10-CM | POA: Diagnosis not present

## 2017-02-16 DIAGNOSIS — I639 Cerebral infarction, unspecified: Secondary | ICD-10-CM | POA: Diagnosis not present

## 2017-02-16 DIAGNOSIS — I63412 Cerebral infarction due to embolism of left middle cerebral artery: Principal | ICD-10-CM | POA: Diagnosis present

## 2017-02-16 DIAGNOSIS — R7303 Prediabetes: Secondary | ICD-10-CM | POA: Diagnosis present

## 2017-02-16 DIAGNOSIS — Z9989 Dependence on other enabling machines and devices: Secondary | ICD-10-CM | POA: Diagnosis not present

## 2017-02-16 DIAGNOSIS — R4702 Dysphasia: Secondary | ICD-10-CM | POA: Diagnosis present

## 2017-02-16 DIAGNOSIS — G4733 Obstructive sleep apnea (adult) (pediatric): Secondary | ICD-10-CM

## 2017-02-16 DIAGNOSIS — Z8249 Family history of ischemic heart disease and other diseases of the circulatory system: Secondary | ICD-10-CM

## 2017-02-16 DIAGNOSIS — M5136 Other intervertebral disc degeneration, lumbar region: Secondary | ICD-10-CM | POA: Diagnosis present

## 2017-02-16 DIAGNOSIS — R0989 Other specified symptoms and signs involving the circulatory and respiratory systems: Secondary | ICD-10-CM | POA: Diagnosis present

## 2017-02-16 DIAGNOSIS — I10 Essential (primary) hypertension: Secondary | ICD-10-CM | POA: Diagnosis not present

## 2017-02-16 DIAGNOSIS — Z880 Allergy status to penicillin: Secondary | ICD-10-CM

## 2017-02-16 DIAGNOSIS — R4701 Aphasia: Secondary | ICD-10-CM | POA: Diagnosis present

## 2017-02-16 DIAGNOSIS — Z833 Family history of diabetes mellitus: Secondary | ICD-10-CM

## 2017-02-16 DIAGNOSIS — R778 Other specified abnormalities of plasma proteins: Secondary | ICD-10-CM

## 2017-02-16 DIAGNOSIS — Z823 Family history of stroke: Secondary | ICD-10-CM

## 2017-02-16 DIAGNOSIS — Z7982 Long term (current) use of aspirin: Secondary | ICD-10-CM

## 2017-02-16 DIAGNOSIS — E785 Hyperlipidemia, unspecified: Secondary | ICD-10-CM | POA: Diagnosis present

## 2017-02-16 DIAGNOSIS — Z7902 Long term (current) use of antithrombotics/antiplatelets: Secondary | ICD-10-CM

## 2017-02-16 HISTORY — DX: Sleep apnea, unspecified: G47.30

## 2017-02-16 HISTORY — DX: Prediabetes: R73.03

## 2017-02-16 LAB — PROTIME-INR
INR: 1.02
Prothrombin Time: 13.5 seconds (ref 11.4–15.2)

## 2017-02-16 LAB — COMPREHENSIVE METABOLIC PANEL
ALT: 29 U/L (ref 17–63)
AST: 41 U/L (ref 15–41)
Albumin: 4.6 g/dL (ref 3.5–5.0)
Alkaline Phosphatase: 63 U/L (ref 38–126)
Anion gap: 10 (ref 5–15)
BILIRUBIN TOTAL: 1.2 mg/dL (ref 0.3–1.2)
BUN: 11 mg/dL (ref 6–20)
CHLORIDE: 104 mmol/L (ref 101–111)
CO2: 25 mmol/L (ref 22–32)
Calcium: 9.7 mg/dL (ref 8.9–10.3)
Creatinine, Ser: 1.2 mg/dL (ref 0.61–1.24)
GFR calc Af Amer: >60 mL/min (ref >60)
Glucose, Bld: 96 mg/dL (ref 65–99)
Potassium: 3.7 mmol/L (ref 3.5–5.1)
Sodium: 139 mmol/L (ref 135–145)
TOTAL PROTEIN: 8 g/dL (ref 6.5–8.1)

## 2017-02-16 LAB — DIFFERENTIAL
BASOS ABS: 0 10*3/uL (ref 0.0–0.1)
Basophils Relative: 0 %
Eosinophils Absolute: 0.2 10*3/uL (ref 0.0–0.7)
Eosinophils Relative: 2 %
Lymphocytes Relative: 36 %
Lymphs Abs: 3.4 10*3/uL (ref 0.7–4.0)
MONOS PCT: 6 %
Monocytes Absolute: 0.5 10*3/uL (ref 0.1–1.0)
NEUTROS ABS: 5.3 10*3/uL (ref 1.7–7.7)
Neutrophils Relative %: 56 %

## 2017-02-16 LAB — CBC
HEMATOCRIT: 42.2 % (ref 39.0–52.0)
HEMOGLOBIN: 14.3 g/dL (ref 13.0–17.0)
MCH: 29.1 pg (ref 26.0–34.0)
MCHC: 33.9 g/dL (ref 30.0–36.0)
MCV: 85.9 fL (ref 78.0–100.0)
Platelets: 257 10*3/uL (ref 150–400)
RBC: 4.91 MIL/uL (ref 4.22–5.81)
RDW: 14 % (ref 11.5–15.5)
WBC: 9.4 10*3/uL (ref 4.0–10.5)

## 2017-02-16 LAB — I-STAT CHEM 8, ED
BUN: 13 mg/dL (ref 6–20)
CREATININE: 1.2 mg/dL (ref 0.61–1.24)
Calcium, Ion: 1.12 mmol/L — ABNORMAL LOW (ref 1.15–1.40)
Chloride: 104 mmol/L (ref 101–111)
GLUCOSE: 93 mg/dL (ref 65–99)
HCT: 44 % (ref 39.0–52.0)
HEMOGLOBIN: 15 g/dL (ref 13.0–17.0)
POTASSIUM: 3.7 mmol/L (ref 3.5–5.1)
Sodium: 141 mmol/L (ref 135–145)
TCO2: 26 mmol/L (ref 0–100)

## 2017-02-16 LAB — I-STAT TROPONIN, ED: TROPONIN I, POC: 0.04 ng/mL (ref 0.00–0.08)

## 2017-02-16 LAB — APTT: APTT: 32 s (ref 24–36)

## 2017-02-16 MED ORDER — LORAZEPAM 2 MG/ML IJ SOLN
1.0000 mg | Freq: Once | INTRAMUSCULAR | Status: AC
  Administered 2017-02-16: 1 mg via INTRAVENOUS
  Filled 2017-02-16: qty 1

## 2017-02-16 MED ORDER — ACETAMINOPHEN 650 MG RE SUPP: 650.0000 mg | RECTAL | Status: DC | PRN

## 2017-02-16 MED ORDER — ACETAMINOPHEN 160 MG/5ML PO SOLN: 650.0000 mg | ORAL | Status: DC | PRN

## 2017-02-16 MED ORDER — SODIUM CHLORIDE 0.9 % IV SOLN: INTRAVENOUS | Status: AC

## 2017-02-16 MED ORDER — GADOBENATE DIMEGLUMINE 529 MG/ML IV SOLN
20.0000 mL | Freq: Once | INTRAVENOUS | Status: AC | PRN
  Administered 2017-02-16: 20 mL via INTRAVENOUS

## 2017-02-16 MED ORDER — LABETALOL HCL 5 MG/ML IV SOLN: 5.0000 mg | INTRAVENOUS | Status: DC | PRN

## 2017-02-16 MED ORDER — ASPIRIN 325 MG PO TABS
325.0000 mg | ORAL_TABLET | Freq: Every day | ORAL | Status: DC
  Administered 2017-02-18 – 2017-02-19 (×3): 325 mg via ORAL
  Filled 2017-02-16 (×3): qty 1

## 2017-02-16 MED ORDER — LORAZEPAM 1 MG PO TABS: 1.0000 mg | ORAL_TABLET | Freq: Four times a day (QID) | ORAL | Status: DC | PRN

## 2017-02-16 MED ORDER — ACETAMINOPHEN 325 MG PO TABS: 650.0000 mg | ORAL_TABLET | ORAL | Status: DC | PRN

## 2017-02-16 MED ORDER — STROKE: EARLY STAGES OF RECOVERY BOOK
Freq: Once | Status: DC
  Filled 2017-02-16: qty 1

## 2017-02-16 MED ORDER — ENOXAPARIN SODIUM 40 MG/0.4ML ~~LOC~~ SOLN
40.0000 mg | Freq: Every day | SUBCUTANEOUS | Status: DC
  Filled 2017-02-16: qty 0.4

## 2017-02-16 MED ORDER — HYDROCODONE-ACETAMINOPHEN 5-325 MG PO TABS: 1.0000 | ORAL_TABLET | ORAL | Status: DC | PRN

## 2017-02-16 MED ORDER — ASPIRIN 300 MG RE SUPP
300.0000 mg | Freq: Every day | RECTAL | Status: DC
  Administered 2017-02-17: 300 mg via RECTAL

## 2017-02-16 MED ORDER — SENNOSIDES-DOCUSATE SODIUM 8.6-50 MG PO TABS: 1.0000 | ORAL_TABLET | Freq: Every evening | ORAL | Status: DC | PRN

## 2017-02-16 NOTE — Consult Note (Signed)
Referring Physician: Dr. Alvino Chapel    Chief Complaint: New onset dysphasia, dysarthria and right facial droop  HPI: John Finley is an 66 y.o. male who presented to the ED for assessment of new onset facial droop with drooling from the right side of his mouth, in addition to unsteady gait, mild dysarthria and halting speech. His symptoms were first noticed yesterday after awakening. He was speaking with his grandson who is of a young age - the child asked something to the effect of "why are you spitting on me when you talk?". The patient then noted the drooling from the right side of his mouth. His wife also noted mild dysarthria, right facial droop and halting speech with difficulty coming up with words. He took a baby ASA before coming to the ED today to be evaluated. He does not regularly take an antiplatelet medication. He is on Exforge for his HTN.   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 He has a history of HTN treated with antihypertensives, OSA on CPAP, cardiac dysrhythmia first noted in his teens, CAD and prediabetes. He has no prior history of stroke or MI.   He denies focal weakness, incoordination, sensory numbness, vision loss, headache or confusion. He has had prior episode of numbness along the lateral aspect of his right thigh that is felt to be secondary to chronic lumbar disc bulge. He is a little unsteady on his feet at baseline due to a prior left foot injury.   Past Medical History:  Diagnosis Date  . Hypertension   . Prediabetes     Past Surgical History:  Procedure Laterality Date  . ANKLE SURGERY      History reviewed. No pertinent family history. Social History:  reports that he has never smoked. He has never used smokeless tobacco. He reports that he does not drink alcohol or use drugs.  Allergies:  Allergies  Allergen Reactions  . Penicillins Itching and Rash  . Sulfa Antibiotics Hives, Itching and Rash    Medications:   amLODipine-valsartan (EXFORGE) 10-320 MG tablet Take 1 tablet by mouth daily. Vianne Bulls, MD Not Ordered  aspirin EC 81 MG tablet Take 81 mg by mouth once. Vianne Bulls, MD Not Ordered    ROS: No chest pain. Other ROS as per HPI.   Physical Examination: Blood pressure (!) 158/45, pulse 79, temperature 98.8 F (37.1 C), resp. rate 13, height 5' 6"  (1.676 m), weight 106.6 kg (235 lb), SpO2 99 %.  HEENT: [place]/AT Lungs: Respirations unlabored Ext: No edema  Neurologic Examination: Ment: Awake, alert and fully oriented. Pleasant and cooperative. Normal insight. Evaluation of speech reveals mild/subtle dysarthria, one phonemic paraphasia, intact repetition and naming. Subtle deficit with complex, directional 3-step commands. Halting quality to speech also noted.  CN: No visual field cut. PERRL. EOMI without nystagmus. Facial temp sensation intact. Mild right facial droop, lower quadrant. Hearing intact to conversation. No hypophonia or hoarseness. Palate elevates normally. Shoulder shrug with subtle lag on right. Tongue protrudes midline.  Motor: 5/5 in all 4 extremities proximally and distally. No pronator drift. Pincer grasp full bilaterally.  Sensory: Intact to FT and temperature in all 4 extremities. No extinction.  Reflexes: 2+ brachioradialis and biceps bilaterally. 1+ right patella. 0 left patella. 1+ achilles bilaterally. Toes downgoing.  Cerebellar: No ataxia with FNF bilaterally.  Gait: Deferred.   Results for orders placed or performed during the hospital encounter of 02/16/17 (from the past 48 hour(s))  Protime-INR  Status: None   Collection Time: 02/16/17  5:43 PM  Result Value Ref Range   Prothrombin Time 13.5 11.4 - 15.2 seconds   INR 1.02   APTT     Status: None   Collection Time: 02/16/17  5:43 PM  Result Value Ref Range   aPTT 32 24 - 36 seconds  CBC     Status: None   Collection Time: 02/16/17  5:43 PM  Result Value Ref Range   WBC 9.4 4.0 - 10.5 K/uL   RBC  4.91 4.22 - 5.81 MIL/uL   Hemoglobin 14.3 13.0 - 17.0 g/dL   HCT 42.2 39.0 - 52.0 %   MCV 85.9 78.0 - 100.0 fL   MCH 29.1 26.0 - 34.0 pg   MCHC 33.9 30.0 - 36.0 g/dL   RDW 14.0 11.5 - 15.5 %   Platelets 257 150 - 400 K/uL  Differential     Status: None   Collection Time: 02/16/17  5:43 PM  Result Value Ref Range   Neutrophils Relative % 56 %   Neutro Abs 5.3 1.7 - 7.7 K/uL   Lymphocytes Relative 36 %   Lymphs Abs 3.4 0.7 - 4.0 K/uL   Monocytes Relative 6 %   Monocytes Absolute 0.5 0.1 - 1.0 K/uL   Eosinophils Relative 2 %   Eosinophils Absolute 0.2 0.0 - 0.7 K/uL   Basophils Relative 0 %   Basophils Absolute 0.0 0.0 - 0.1 K/uL  Comprehensive metabolic panel     Status: None   Collection Time: 02/16/17  5:43 PM  Result Value Ref Range   Sodium 139 135 - 145 mmol/L   Potassium 3.7 3.5 - 5.1 mmol/L   Chloride 104 101 - 111 mmol/L   CO2 25 22 - 32 mmol/L   Glucose, Bld 96 65 - 99 mg/dL   BUN 11 6 - 20 mg/dL   Creatinine, Ser 1.20 0.61 - 1.24 mg/dL   Calcium 9.7 8.9 - 10.3 mg/dL   Total Protein 8.0 6.5 - 8.1 g/dL   Albumin 4.6 3.5 - 5.0 g/dL   AST 41 15 - 41 U/L   ALT 29 17 - 63 U/L   Alkaline Phosphatase 63 38 - 126 U/L   Total Bilirubin 1.2 0.3 - 1.2 mg/dL   GFR calc non Af Amer >60 >60 mL/min   GFR calc Af Amer >60 >60 mL/min    Comment: (NOTE) The eGFR has been calculated using the CKD EPI equation. This calculation has not been validated in all clinical situations. eGFR's persistently <60 mL/min signify possible Chronic Kidney Disease.    Anion gap 10 5 - 15  I-stat troponin, ED     Status: None   Collection Time: 02/16/17  6:00 PM  Result Value Ref Range   Troponin i, poc 0.04 0.00 - 0.08 ng/mL   Comment 3            Comment: Due to the release kinetics of cTnI, a negative result within the first hours of the onset of symptoms does not rule out myocardial infarction with certainty. If myocardial infarction is still suspected, repeat the test at appropriate  intervals.   I-Stat Chem 8, ED     Status: Abnormal   Collection Time: 02/16/17  6:02 PM  Result Value Ref Range   Sodium 141 135 - 145 mmol/L   Potassium 3.7 3.5 - 5.1 mmol/L   Chloride 104 101 - 111 mmol/L   BUN 13 6 - 20 mg/dL   Creatinine,  Ser 1.20 0.61 - 1.24 mg/dL   Glucose, Bld 93 65 - 99 mg/dL   Calcium, Ion 1.12 (L) 1.15 - 1.40 mmol/L   TCO2 26 0 - 100 mmol/L   Hemoglobin 15.0 13.0 - 17.0 g/dL   HCT 44.0 39.0 - 52.0 %   Ct Head Wo Contrast  Result Date: 02/16/2017 CLINICAL DATA:  Right facial droop.  Difficulty speaking. EXAM: CT HEAD WITHOUT CONTRAST TECHNIQUE: Contiguous axial images were obtained from the base of the skull through the vertex without intravenous contrast. COMPARISON:  06/22/2005 head CT. FINDINGS: Brain: No evidence of parenchymal hemorrhage or extra-axial fluid collection. No mass lesion, mass effect, or midline shift. No CT evidence of acute infarction. Nonspecific mild subcortical and periventricular white matter hypodensity, most in keeping with chronic small vessel ischemic change. Cerebral volume is age appropriate. No ventriculomegaly. Vascular: No hyperdense vessel or unexpected calcification. Intracranial atherosclerosis. Skull: No evidence of calvarial fracture. Sinuses/Orbits: Mild mucoperiosteal thickening in the bilateral ethmoidal air cells and left maxillary sinus. No fluid levels in the visualized paranasal sinuses. Other:  The mastoid air cells are unopacified. IMPRESSION: 1.  No evidence of acute intracranial abnormality. 2. Mild chronic small vessel ischemia. 3. Mild paranasal sinusitis, probably chronic. Electronically Signed   By: Ilona Sorrel M.D.   On: 02/16/2017 18:49    Assessment: 66 y.o. male presenting with symptoms and exam findings most consistent with a small left MCA stroke.  1. CT without hemorrhage or other acute intracranial abnormality. Mild chronic small vessel ischemic changes are noted.  2. Stroke Risk Factors - CAD, OSA,  prediabetes, HTN, age 56. EKG without atrial fibrillation.   Plan: 1. HgbA1c, fasting lipid panel 2. MRI, MRA  of the brain without contrast 3. PT consult, OT consult, Speech consult 4. Echocardiogram 5. MRA of neck 6. ASA 81 mg po qd 7. Risk factor modification 8. Telemetry monitoring 9. Frequent neuro checks 10. Permissive HTN x 24 hours 11. Atorvastatin 80 mg po qd. Obtain baseline CK level. 12. Light aerobic exercise program as an outpatient if cleared from a cardiac standpoint.  @Electronically  signed: Dr. Kerney Elbe  02/16/2017, 10:50 PM

## 2017-02-16 NOTE — ED Triage Notes (Signed)
Pt reports waking up yesterday with facial droop and drooling from right side of mouth. Today is having unsteady gait and difficulty speaking. Hypertensive at triage.

## 2017-02-16 NOTE — ED Provider Notes (Signed)
[facility] DEPT Provider Note   CSN: [id number] Arrival date & time: 02/16/17  1728     History   Chief Complaint Chief Complaint  Patient presents with  . Stroke Symptoms    HPI John Finley is a 66 y.o. male.  HPI Patient resents with what he suspects is a stroke. States that yesterday he began to have some facial droop and drooling. Is on the right side of his face. He is a Field seismologist. Also may have had some difficulty speaking with it. Reportedly had slurred speech and some difficulty getting the words out. No headache. Denies numbness or weakness. States he's a little unsteady at baseline from previous left foot injury. Family member with him states that he is more unsteady than baseline and was having difficulty walking. No history of stroke. No chest pain. He has had chronic bulging disks in his back and states a few months ago he did have some numbness on the right side of his right leg that resolved on its own.   Past Medical History:  Diagnosis Date  . Hypertension   . Prediabetes     There are no active problems to display for this patient.   Past Surgical History:  Procedure Laterality Date  . ANKLE SURGERY         Home Medications    Prior to Admission medications   Medication Sig Start Date End Date Taking? Authorizing Provider  amLODipine-valsartan (EXFORGE) 10-320 MG tablet Take 1 tablet by mouth daily.   Yes Historical Provider, MD  aspirin EC 81 MG tablet Take 81 mg by mouth once.   Yes Historical Provider, MD    Family History History reviewed. No pertinent family history.  Social History Social History  Substance Use Topics  . Smoking status: Never Smoker  . Smokeless tobacco: Never Used  . Alcohol use No     Allergies   Penicillins and Sulfa antibiotics   Review of Systems Review of Systems  Constitutional: Negative for appetite change.  HENT: Positive for drooling. Negative for congestion.   Eyes: Negative for  photophobia.  Respiratory: Negative for shortness of breath.   Cardiovascular: Negative for chest pain.  Gastrointestinal: Negative for abdominal pain.  Genitourinary: Negative for difficulty urinating.  Musculoskeletal: Positive for back pain.  Neurological: Positive for facial asymmetry and speech difficulty. Negative for light-headedness and headaches.  Hematological: Negative for adenopathy.  Psychiatric/Behavioral: Negative for confusion.     Physical Exam Updated Vital Signs BP 162/94   Pulse 78   Temp 99 F (37.2 C) (Oral)   Resp 19   Ht 5\' 6"  (1.676 m)   Wt 235 lb (106.6 kg)   SpO2 97%   BMI 37.93 kg/m   Physical Exam  Constitutional: He appears well-developed.  HENT:  Head: Atraumatic.  Right-sided lower facial droop. Forehead appears to be spared.  Eyes: EOM are normal. Pupils are equal, round, and reactive to light.  Neck: Neck supple.  Cardiovascular: Normal rate.   Abdominal: Soft.  Musculoskeletal: Normal range of motion. He exhibits no edema.  Neurological: He is alert.  Right lower facial droop. Forehead appears spared. Eye movements intact. Visual fields intact grossly by confrontation. Tongue midline. Good dressing bilaterally. Finger-nose intact bilaterally. Heel shin normal. Good straight leg raise bilaterally. No Romberg. Normal gait with limited examination.  Skin: Skin is warm. Capillary refill takes less than 2 seconds.     ED Treatments / Results  Labs (all labs ordered are listed, but only abnormal  results are displayed) Labs Reviewed  I-STAT CHEM 8, ED - Abnormal; Notable for the following:       Result Value   Calcium, Ion 1.12 (*)    All other components within normal limits  PROTIME-INR  APTT  CBC  DIFFERENTIAL  COMPREHENSIVE METABOLIC PANEL  I-STAT TROPOININ, ED    EKG  EKG Interpretation  Date/Time:  Saturday February 16 2017 17:36:49 EST Ventricular Rate:  90 PR Interval:  204 QRS Duration: 94 QT Interval:  366 QTC  Calculation: 447 R Axis:   65 Text Interpretation:  Sinus rhythm with Premature atrial complexes Possible Left atrial enlargement Nonspecific T wave abnormality Abnormal ECG Confirmed by Alvino Chapel  MD, Jazlin Tapscott [phone]) on 02/16/2017 6:45:24 PM       Radiology Ct Head Wo Contrast  Result Date: 02/16/2017 CLINICAL DATA:  Right facial droop.  Difficulty speaking. EXAM: CT HEAD WITHOUT CONTRAST TECHNIQUE: Contiguous axial images were obtained from the base of the skull through the vertex without intravenous contrast. COMPARISON:  06/22/2005 head CT. FINDINGS: Brain: No evidence of parenchymal hemorrhage or extra-axial fluid collection. No mass lesion, mass effect, or midline shift. No CT evidence of acute infarction. Nonspecific mild subcortical and periventricular white matter hypodensity, most in keeping with chronic small vessel ischemic change. Cerebral volume is age appropriate. No ventriculomegaly. Vascular: No hyperdense vessel or unexpected calcification. Intracranial atherosclerosis. Skull: No evidence of calvarial fracture. Sinuses/Orbits: Mild mucoperiosteal thickening in the bilateral ethmoidal air cells and left maxillary sinus. No fluid levels in the visualized paranasal sinuses. Other:  The mastoid air cells are unopacified. IMPRESSION: 1.  No evidence of acute intracranial abnormality. 2. Mild chronic small vessel ischemia. 3. Mild paranasal sinusitis, probably chronic. Electronically Signed   By: Ilona Sorrel M.D.   On: 02/16/2017 18:49    Procedures Procedures (including critical care time)  Medications Ordered in ED Medications - No data to display   Initial Impression / Assessment and Plan / ED Course  I have reviewed the triage vital signs and the nursing notes.  Pertinent labs & imaging results that were available during my care of the patient were reviewed by me and considered in my medical decision making (see chart for details).     Patient with likely stroke. Last normal  was sometime yesterday. Seen in the ER by neurology. Recommended MRI MRA. Will admit. Head CT reassuring. Not TPA candidate due to time of onset.  Final Clinical Impressions(s) / ED Diagnoses   Final diagnoses:  Cerebrovascular accident (CVA), unspecified mechanism ([place])    New Prescriptions New Prescriptions   No medications on file     Davonna Belling, MD 02/16/17 2012

## 2017-02-16 NOTE — H&P (Signed)
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 Patient reports that he had been in his usual state of health until yesterday when he was playing with his grandson and his grandson noted him to be drooling from the right side of his mouth. Patient and his family then noted a slight right facial droop. He was also noted to have difficulty speaking with slurring. Patient also reports difficulty "getting the words out," but denies trouble with word-finding. These symptoms were first noted shortly after he woke yesterday. He reports occasional palpitations which she has attributed to PACs, but denies any headache or change in vision or hearing. There has not been any significant focal numbness or weakness other than in the right face. There's been no recent fall or trauma and he has never experienced similar symptoms previously. He reports taking an aspirin this afternoon before coming in for evaluation. There's been no recent fevers or chills, no chest pain, no cough, no dyspnea, no abdominal pain, nausea, vomiting, or diarrhea. Patient denies any significant use of alcohol or illicit substances. He is a former smoker.  ED Course: Upon arrival to the ED, patient is found to be afebrile, saturating well on room air, hypertensive to 190/100, and with normal heart rate and respirations. EKG features a sinus rhythm with PVCs, LAE, and nonspecific T-wave abnormality anteriorly. Noncontrast head CT is negative for acute intracranial abnormality, but notable for mild chronic small vessel  ischemic disease and mild paranasal sinusitis which is likely chronic. Chemistry panel was unremarkable and so a CBC. INR is within the normal limits, as is troponin. Neurology was consulted by the ED physician and advised a medical admission with MRI and further evaluation of strokelike symptoms. Patient has remained mildly hypertensive, but otherwise stable in the emergency department and will be observed on the telemetry unit for ongoing evaluation and management of suspected CVA.  Review of Systems:  All other systems reviewed and apart from HPI, are negative.  Past Medical History:  Diagnosis Date  . Hypertension   . Prediabetes     Past Surgical History:  Procedure Laterality Date  . ANKLE SURGERY       reports that he has never smoked. He has never used smokeless tobacco. He reports that he does not drink alcohol or use drugs.  Allergies  Allergen Reactions  . Penicillins Itching and Rash  . Sulfa Antibiotics Hives, Itching and Rash    History reviewed. No pertinent family history.   Prior to Admission medications   Medication Sig Start Date End Date Taking? Authorizing Provider  amLODipine-valsartan (EXFORGE) 10-320 MG tablet Take 1 tablet by mouth daily.   Yes Historical Provider, MD  aspirin EC 81 MG tablet Take 81 mg by mouth once.   Yes Historical Provider, MD    Physical Exam: Vitals:   02/16/17 1945 02/16/17 2015 02/16/17 2030 02/16/17 2045  BP: 180/100 160/92 156/82 157/100  Pulse: 82 68 69 84  Resp: 14 20 24 17   Temp:      TempSrc:      SpO2: 98% 97% 99% 100%  Weight:  Height:          Constitutional: NAD, calm, comfortable Eyes: PERTLA, lids and conjunctivae normal ENMT: Mucous membranes are moist. Posterior pharynx clear of any exudate or lesions.   Neck: normal, supple, no masses, no thyromegaly Respiratory: clear to auscultation bilaterally, no wheezing, no crackles. Normal respiratory effort.  Cardiovascular: S1 & S2 heard, regular rate  and rhythm. No extremity edema.   Abdomen: No distension, no tenderness, no masses palpated. Bowel sounds normal.  Musculoskeletal: no clubbing / cyanosis. No joint deformity upper and lower extremities.    Skin: no significant rashes, lesions, ulcers. Warm, dry, well-perfused. Neurologic: Slightly asymmetric smile with right side droop. Slight dysarthria. Sensation intact throughout. Grip strength 5/5 b/l  Psychiatric: Normal judgment and insight. Alert and oriented x 3. Normal mood and affect.     Labs on Admission: I have personally reviewed following labs and imaging studies  CBC:  Recent Labs Lab 02/16/17 1743 02/16/17 1802  WBC 9.4  --   NEUTROABS 5.3  --   HGB 14.3 15.0  HCT 42.2 44.0  MCV 85.9  --   PLT 257  --    Basic Metabolic Panel:  Recent Labs Lab 02/16/17 1743 02/16/17 1802  NA 139 141  K 3.7 3.7  CL 104 104  CO2 25  --   GLUCOSE 96 93  BUN 11 13  CREATININE 1.20 1.20  CALCIUM 9.7  --    GFR: Estimated Creatinine Clearance: 71.2 mL/min (by C-G formula based on SCr of 1.2 mg/dL). Liver Function Tests:  Recent Labs Lab 02/16/17 1743  AST 41  ALT 29  ALKPHOS 63  BILITOT 1.2  PROT 8.0  ALBUMIN 4.6   No results for input(s): LIPASE, AMYLASE in the last 168 hours. No results for input(s): AMMONIA in the last 168 hours. Coagulation Profile:  Recent Labs Lab 02/16/17 1743  INR 1.02   Cardiac Enzymes: No results for input(s): CKTOTAL, CKMB, CKMBINDEX, TROPONINI in the last 168 hours. BNP (last 3 results) No results for input(s): PROBNP in the last 8760 hours. HbA1C: No results for input(s): HGBA1C in the last 72 hours. CBG: No results for input(s): GLUCAP in the last 168 hours. Lipid Profile: No results for input(s): CHOL, HDL, LDLCALC, TRIG, CHOLHDL, LDLDIRECT in the last 72 hours. Thyroid Function Tests: No results for input(s): TSH, T4TOTAL, FREET4, T3FREE, THYROIDAB in the last 72 hours. Anemia Panel: No results for input(s):  VITAMINB12, FOLATE, FERRITIN, TIBC, IRON, RETICCTPCT in the last 72 hours. Urine analysis:    Component Value Date/Time   COLORURINE YELLOW 07/05/2011 1329   APPEARANCEUR CLEAR 07/05/2011 1329   LABSPEC 1.033 (H) 07/05/2011 1329   PHURINE 5.5 07/05/2011 1329   GLUCOSEU NEGATIVE 07/05/2011 1329   HGBUR NEGATIVE 07/05/2011 1329   BILIRUBINUR NEGATIVE 07/05/2011 1329   KETONESUR 15 (A) 07/05/2011 1329   PROTEINUR NEGATIVE 07/05/2011 1329   UROBILINOGEN 1.0 07/05/2011 1329   NITRITE NEGATIVE 07/05/2011 1329   LEUKOCYTESUR NEGATIVE 07/05/2011 1329   Sepsis Labs: @LABRCNTIP (procalcitonin:4,lacticidven:4) )No results found for this or any previous visit (from the past 240 hour(s)).   Radiological Exams on Admission: Ct Head Wo Contrast  Result Date: 02/16/2017 CLINICAL DATA:  Right facial droop.  Difficulty speaking. EXAM: CT HEAD WITHOUT CONTRAST TECHNIQUE: Contiguous axial images were obtained from the base of the skull through the vertex without intravenous contrast. COMPARISON:  06/22/2005 head CT. FINDINGS: Brain: No evidence of parenchymal hemorrhage or extra-axial fluid collection. No mass lesion, mass effect, or midline shift. No  CT evidence of acute infarction. Nonspecific mild subcortical and periventricular white matter hypodensity, most in keeping with chronic small vessel ischemic change. Cerebral volume is age appropriate. No ventriculomegaly. Vascular: No hyperdense vessel or unexpected calcification. Intracranial atherosclerosis. Skull: No evidence of calvarial fracture. Sinuses/Orbits: Mild mucoperiosteal thickening in the bilateral ethmoidal air cells and left maxillary sinus. No fluid levels in the visualized paranasal sinuses. Other:  The mastoid air cells are unopacified. IMPRESSION: 1.  No evidence of acute intracranial abnormality. 2. Mild chronic small vessel ischemia. 3. Mild paranasal sinusitis, probably chronic. Electronically Signed   By: Ilona Sorrel M.D.   On: 02/16/2017  18:49    EKG: Independently reviewed. Sinus rhythm, PVC's, LAE, non-specific T-wave abnormality  Assessment/Plan  1. Right facial droop, expressive aphasia  - Pt noted to be drooling on morning of presentation, then noted to have dysarthria and perhaps mild expressive aphasia  - Head CT negative for acute pathology, notable for mild chronic small-vessel ischemic disease  - MRI/MRA reveals acute CVA within left corona radiata, no acute hemorrhage or mass effect, no high-grade stenosis, multifocal narrowing of non-dominant right vertebral artery  - Monitor on telemetry with frequent neuro checks, PT/OT/SLP evals, permissive HTN   - Check carotid dopplers and TTE, fasting lipids and A1c  - Continue prophylactic ASA, start Lipitor 40 mg   2. Hypertension with hypertensive urgency - BP 190/100 range in ED - Managed at home with Norvasc and valsartan  - Permissive HTN for now to 210/110   3. OSA on CPAP  - Tolerating CPAP at home, will continue qHS     DVT prophylaxis: sq Lovenox  Code Status: Full  Family Communication: Wife and daughters updated at bedside Disposition Plan: Observe on telemetry Consults called: Neurology Admission status: Observation    Vianne Bulls, MD [facility] Pager [phone]  If 7PM-7AM, please contact night-coverage [email or web address] Password [facility]  02/16/2017, 9:44 PM

## 2017-02-17 ENCOUNTER — Encounter (HOSPITAL_COMMUNITY): Payer: Self-pay

## 2017-02-17 ENCOUNTER — Inpatient Hospital Stay (HOSPITAL_COMMUNITY): Payer: 59

## 2017-02-17 ENCOUNTER — Observation Stay (HOSPITAL_COMMUNITY): Payer: 59

## 2017-02-17 DIAGNOSIS — R4702 Dysphasia: Secondary | ICD-10-CM | POA: Diagnosis present

## 2017-02-17 DIAGNOSIS — Z9989 Dependence on other enabling machines and devices: Secondary | ICD-10-CM | POA: Diagnosis not present

## 2017-02-17 DIAGNOSIS — I63412 Cerebral infarction due to embolism of left middle cerebral artery: Secondary | ICD-10-CM | POA: Diagnosis present

## 2017-02-17 DIAGNOSIS — R7303 Prediabetes: Secondary | ICD-10-CM | POA: Diagnosis present

## 2017-02-17 DIAGNOSIS — Z823 Family history of stroke: Secondary | ICD-10-CM | POA: Diagnosis not present

## 2017-02-17 DIAGNOSIS — Z79899 Other long term (current) drug therapy: Secondary | ICD-10-CM | POA: Diagnosis not present

## 2017-02-17 DIAGNOSIS — Z882 Allergy status to sulfonamides status: Secondary | ICD-10-CM | POA: Diagnosis not present

## 2017-02-17 DIAGNOSIS — G4733 Obstructive sleep apnea (adult) (pediatric): Secondary | ICD-10-CM

## 2017-02-17 DIAGNOSIS — I639 Cerebral infarction, unspecified: Secondary | ICD-10-CM

## 2017-02-17 DIAGNOSIS — R4701 Aphasia: Secondary | ICD-10-CM | POA: Diagnosis present

## 2017-02-17 DIAGNOSIS — Z87891 Personal history of nicotine dependence: Secondary | ICD-10-CM | POA: Diagnosis not present

## 2017-02-17 DIAGNOSIS — M5030 Other cervical disc degeneration, unspecified cervical region: Secondary | ICD-10-CM | POA: Diagnosis present

## 2017-02-17 DIAGNOSIS — Z8249 Family history of ischemic heart disease and other diseases of the circulatory system: Secondary | ICD-10-CM | POA: Diagnosis not present

## 2017-02-17 DIAGNOSIS — Z7982 Long term (current) use of aspirin: Secondary | ICD-10-CM | POA: Diagnosis not present

## 2017-02-17 DIAGNOSIS — Z7902 Long term (current) use of antithrombotics/antiplatelets: Secondary | ICD-10-CM | POA: Diagnosis not present

## 2017-02-17 DIAGNOSIS — R748 Abnormal levels of other serum enzymes: Secondary | ICD-10-CM | POA: Diagnosis not present

## 2017-02-17 DIAGNOSIS — I340 Nonrheumatic mitral (valve) insufficiency: Secondary | ICD-10-CM | POA: Diagnosis not present

## 2017-02-17 DIAGNOSIS — Z880 Allergy status to penicillin: Secondary | ICD-10-CM | POA: Diagnosis not present

## 2017-02-17 DIAGNOSIS — Z833 Family history of diabetes mellitus: Secondary | ICD-10-CM | POA: Diagnosis not present

## 2017-02-17 DIAGNOSIS — I160 Hypertensive urgency: Secondary | ICD-10-CM | POA: Diagnosis not present

## 2017-02-17 DIAGNOSIS — Z9889 Other specified postprocedural states: Secondary | ICD-10-CM | POA: Diagnosis not present

## 2017-02-17 DIAGNOSIS — E785 Hyperlipidemia, unspecified: Secondary | ICD-10-CM | POA: Diagnosis present

## 2017-02-17 DIAGNOSIS — M5136 Other intervertebral disc degeneration, lumbar region: Secondary | ICD-10-CM | POA: Diagnosis present

## 2017-02-17 DIAGNOSIS — I10 Essential (primary) hypertension: Secondary | ICD-10-CM | POA: Diagnosis not present

## 2017-02-17 LAB — ECHOCARDIOGRAM COMPLETE
HEIGHTINCHES: 67 in
WEIGHTICAEL: 3572.8 [oz_av]

## 2017-02-17 LAB — TROPONIN I
TROPONIN I: 0.07 ng/mL — AB (ref <0.03)
Troponin I: 0.07 ng/mL (ref <0.03)
Troponin I: 0.06 ng/mL (ref <0.03)

## 2017-02-17 LAB — TSH: TSH: 2.789 u[IU]/mL (ref 0.350–4.500)

## 2017-02-17 LAB — LIPID PANEL
Cholesterol: 178 mg/dL (ref 0–200)
HDL: 32 mg/dL — ABNORMAL LOW (ref >40)
LDL Cholesterol: 125 mg/dL — ABNORMAL HIGH (ref 0–99)
Total CHOL/HDL Ratio: 5.6 RATIO
Triglycerides: 104 mg/dL (ref <150)
VLDL: 21 mg/dL (ref 0–40)

## 2017-02-17 LAB — HIV ANTIBODY (ROUTINE TESTING W REFLEX): HIV SCREEN 4TH GENERATION: NONREACTIVE

## 2017-02-17 MED ORDER — HYDRALAZINE HCL 20 MG/ML IJ SOLN: 10.0000 mg | Freq: Four times a day (QID) | INTRAMUSCULAR | Status: DC | PRN

## 2017-02-17 MED ORDER — ATORVASTATIN CALCIUM 80 MG PO TABS
80.0000 mg | ORAL_TABLET | Freq: Every day | ORAL | Status: DC
  Administered 2017-02-17 – 2017-02-18 (×2): 80 mg via ORAL
  Filled 2017-02-17 (×2): qty 1

## 2017-02-17 MED ORDER — ATORVASTATIN CALCIUM 40 MG PO TABS: 40.0000 mg | ORAL_TABLET | Freq: Every day | ORAL | Status: DC

## 2017-02-17 NOTE — Progress Notes (Signed)
VASCULAR LAB    Patient had normal MRA of neck. Please advise if you would still like carotid duplex.   Thank you,    Rohnan Bartleson, RVT 02/17/2017, 6:53 AM

## 2017-02-17 NOTE — Evaluation (Signed)
Occupational Therapy Evaluation/Discharge Patient Details Name: John Finley MRN: [id number] DOB: 10/04/1951 Today's Date: 02/17/2017    History of Present Illness 66 y.o. male who presented to the ED for assessment of new onset facial droop with drooling from the right side of his mouth, in addition to unsteady gait, mild dysarthria and halting speech.  PMH consists of HTN, OSA, cardiac dysrhythmia, CAD, prediabetes, and s/p L ankle ORIF 2012.    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 He currently demonstrates the ability to complete all ADL independently in hospital setting. He reports that his singular concern is his slurred speech and difficulty "getting his words out." He also reports difficulty typing words correctly for text messaging but is able to notice and self-correct. Feel this is a communication deficit rather than cognitive as pt able to complete problem solving and memory tasks as related to ADL independently. No further acute OT needs identified at this time and pt/family report no further questions or concerns. Will sign off.     Follow Up Recommendations  No OT follow up    Equipment Recommendations  None recommended by OT    Recommendations for Other Services       Precautions / Restrictions Precautions Precautions: None Restrictions Weight Bearing Restrictions: No      Mobility Bed Mobility               General bed mobility comments: OOB in chair on OT arrival  Transfers Overall transfer level: Independent Equipment used: None                  Balance Overall balance assessment: Independent                                          ADL Overall ADL's : Independent;At baseline                                       General ADL Comments: Able to complete all ADL including fine motor tasks involved in  grooming independently in hospital setting.     Vision Patient Visual Report: No change from baseline Vision Assessment?: No apparent visual deficits Additional Comments: Able to use vision functionally. Able to read without difficulty.     Perception     Praxis Praxis Praxis tested?: Within functional limits    Pertinent Vitals/Pain Pain Assessment: No/denies pain     Hand Dominance Left   Extremity/Trunk Assessment Upper Extremity Assessment Upper Extremity Assessment: Overall WFL for tasks assessed   Lower Extremity Assessment Lower Extremity Assessment: Overall WFL for tasks assessed   Cervical / Trunk Assessment Cervical / Trunk Assessment: Normal   Communication Communication Communication: No difficulties   Cognition Arousal/Alertness: Awake/alert Behavior During Therapy: WFL for tasks assessed/performed Overall Cognitive Status: Within Functional Limits for tasks assessed                 General Comments: Reports difficulty with texting and has been spelling words incorrectly but able to self-correct with increased time. Feel this is likely due to a communication deficit rather than cognitive. Memory skills in tact and able to problem solve during OT evaluation.   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.        OT Problem  List: Decreased safety awareness      OT Treatment/Interventions:      OT Goals(Current goals can be found in the care plan section) Acute Rehab OT Goals Patient Stated Goal: address his slurred speech OT Goal Formulation: With patient/family Time For Goal Achievement: 03/03/17 Potential to Achieve Goals: Good  OT Frequency:     Barriers to D/C:            Co-evaluation              End of Session Nurse Communication: Mobility status  Activity Tolerance: Patient tolerated treatment well Patient left: in chair;with family/visitor present;with call bell/phone within reach  OT Visit Diagnosis: Cognitive communication deficit (R41.841) Symptoms and signs involving cognitive functions: Cerebral infarction                ADL either performed or assessed with clinical judgement  Time: [id number] OT Time Calculation (min): 13 min Charges:  OT General Charges $OT Visit: 1 Procedure OT Evaluation $OT Eval Low Complexity: 1 Procedure G-Codes:     Norman Herrlich, MS OTR/L  Pager: [place] A Mark Hassey 02/17/2017, 4:53 PM

## 2017-02-17 NOTE — Progress Notes (Addendum)
STROKE TEAM PROGRESS NOTE   HISTORY OF PRESENT ILLNESS (per record) John Finley is an 66 y.o. male who presented to the ED for assessment of new onset facial droop with drooling from the right side of his mouth, in addition to unsteady gait, mild dysarthria and halting speech. His symptoms were first noticed yesterday after awakening. He was speaking with his grandson who is of a young age - the child asked something to the effect of "why are you spitting on me when you talk?". The patient then noted the drooling from the right side of his mouth. His wife also noted mild dysarthria, right facial droop and halting speech with difficulty coming up with words. He took a baby ASA before coming to the ED today to be evaluated. He does not regularly take an antiplatelet medication. He is on Exforge for his HTN.   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 He has a history of HTN treated with antihypertensives, OSA on CPAP, cardiac dysrhythmia first noted in his teens, CAD and prediabetes. He has no prior history of stroke or MI.   He denies focal weakness, incoordination, sensory numbness, vision loss, headache or confusion. He has had prior episode of numbness along the lateral aspect of his right thigh that is felt to be secondary to chronic lumbar disc bulge. He is a little unsteady on his feet at baseline due to a prior left foot injury.    SUBJECTIVE (INTERVAL HISTORY) Daughters at bedside, pt and family were updated.   OBJECTIVE Temp:  [98.1 F (36.7 C)-99 F (37.2 C)] 98.1 F (36.7 C) (03/03 2258) Pulse Rate:  [45-110] 45 (03/04 0418) Cardiac Rhythm: Heart block (03/04 0900) Resp:  [11-24] 18 (03/04 0418) BP: (115-191)/(45-105) 124/76 (03/04 0418) SpO2:  [96 %-100 %] 99 % (03/04 0418) Weight:  [101.3 kg (223 lb 4.8 oz)-106.6 kg (235 lb)] 101.3 kg (223 lb 4.8 oz) (03/03 2258)  CBC:   Recent Labs Lab 02/16/17 1743 02/16/17 1802  WBC 9.4  --   NEUTROABS 5.3  --    HGB 14.3 15.0  HCT 42.2 44.0  MCV 85.9  --   PLT 257  --     Basic Metabolic Panel:   Recent Labs Lab 02/16/17 1743 02/16/17 1802  NA 139 141  K 3.7 3.7  CL 104 104  CO2 25  --   GLUCOSE 96 93  BUN 11 13  CREATININE 1.20 1.20  CALCIUM 9.7  --     Lipid Panel:     Component Value Date/Time   CHOL 178 02/17/2017 0444   TRIG 104 02/17/2017 0444   HDL 32 (L) 02/17/2017 0444   CHOLHDL 5.6 02/17/2017 0444   VLDL 21 02/17/2017 0444   LDLCALC 125 (H) 02/17/2017 0444   HgbA1c: No results found for: HGBA1C Urine Drug Screen: No results found for: LABOPIA, COCAINSCRNUR, LABBENZ, AMPHETMU, THCU, LABBARB    IMAGING  Ct Head Wo Contrast 02/16/2017 1. No evidence of acute intracranial abnormality.  2. Mild chronic small vessel ischemia.  3. Mild paranasal sinusitis, probably chronic.    Mr Angiogram Head and Neck Wo Contrast 02/16/2017 1. Acute ischemia within the left corona radiata extending to the left precentral gyrus. This is in keeping with the reported right-sided facial symptoms.  2. No acute hemorrhage or mass effect.  3. No intracranial or cervical arterial occlusion or high-grade stenosis.  4. Apparent mild atherosclerotic narrowing of both middle cerebral arteries is favored to  be an artifact due to motion.  5. Multifocal narrowing of the non-dominant right vertebral artery V4 segment, prior to its termination in the right PICA.     PHYSICAL EXAM HEENT: [place]/AT Lungs: Respirations unlabored Ext: No edema  Neurologic Examination: Ment: Awake, alert and fully oriented. Pleasant and cooperative. Normal insight. Evaluation of speech slurred, no aphasia , one phonemic paraphasia, intact repetition and naming. Subtle deficit with complex, directional 3-step commands. CN: No visual field cut. PERRL. EOMI without nystagmus. Facial temp sensation intact. Mild right facial droop, lower quadrant. Hearing intact to conversation. No hypophonia or hoarseness. Palate elevates  normally. Shoulder shrug with subtle lag on right. Tongue protrudes midline.  Motor: 5/5 in all 4 extremities proximally and distally. No pronator drift. Pincer grasp full bilaterally.  Sensory: Intact to FT and temperature in all 4 extremities. No extinction.  Reflexes: 2+ brachioradialis and biceps bilaterally. 1+ right patella. 0 left patella. 1+ achilles bilaterally. Toes downgoing.  Cerebellar: No ataxia with FNF bilaterally.  Gait: Deferred.     ASSESSMENT/PLAN Mr. John Finley is a 66 y.o. male with history of prediabetes, hypertension, coronary artery disease, obstructive sleep apnea on C Pap, and a history of dysrhythmias presenting with speech difficulties, facial droop, and unsteady gait. He did not receive IV t-PA due to late presentation.  Stroke:  acute ischemia within the left corona radiata extending to the left precentral gyrus.   MRI -  acute ischemia within the left corona radiata extending to the left precentral gyrus.  MRA - no intracranial or cervical arterial occlusion or high-grade stenosis.   Carotid Doppler - MRA neck  2D Echo - pending  LDL - 125  HgbA1c - pending  VTE prophylaxis - Lovenox -> discontinued Diet Heart Room service appropriate? Yes; Fluid consistency: Thin  aspirin 81 mg daily prior to admission, now on aspirin 325 mg daily  Patient counseled to be compliant with his antithrombotic medications  Ongoing aggressive stroke risk factor management  Therapy recommendations: No f/u PT recommended. Independent in mobility, speech therapy evaluation is pending.   Disposition:  Home after stroke work up is complete   Hypertension  Stable  Permissive hypertension (OK if < 220/120) but gradually normalize in 5-7 days  Long-term BP goal normotensive  Hyperlipidemia  Home meds:  No lipid lowering medications prior to admission.   LDL 125, goal < 70  Now on Lipitor 80 mg daily  Continue statin at discharge  Diabetes  HgbA1c  pending, goal < 7.0  Unc / Controlled  Other Stroke Risk Factors  Advanced age  Obesity, Body mass index is 34.97 kg/m., recommend weight loss, diet and exercise as appropriate   Coronary artery disease  Obstructive sleep apnea, on CPAP at home  Other Active Problems  History of dysrhythmias  Hospital day # 0  Total length of visit 35 minutes.  To contact Stroke Continuity provider, please refer to [email or web address] After hours, contact General Neurology

## 2017-02-17 NOTE — Progress Notes (Addendum)
Patient ID: John Finley, male   DOB: 12-Sep-1951, 66 y.o.   MRN: [id number]                                                                PROGRESS NOTE                                                                                                                                                                                                             Patient Demographics:    John Finley, is a 66 y.o. male, DOB - Apr 17, 1951, [id number]  Admit date - 02/16/2017   Admitting Physician Vianne Bulls, MD  Outpatient Primary MD for the patient is Foye Spurling, MD  LOS - 0  Outpatient Specialists:    Chief Complaint  Patient presents with  . 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 Patient reports that he had been in his usual state of health until yesterday when he was playing with his grandson and his grandson noted him to be drooling from the right side of his mouth. Patient and his family then noted a slight right facial droop. He was also noted to have difficulty speaking with slurring. Patient also reports difficulty "getting the words out," but denies trouble with word-finding. These symptoms were first noted shortly after he woke yesterday. He reports occasional palpitations which she has attributed to PACs, but denies any headache or change in vision or hearing. There has not been any significant focal numbness or weakness other than in the right face. There's been no recent fall or trauma and he has never experienced similar symptoms previously. He reports taking an aspirin this afternoon before coming in for evaluation. There's been no recent fevers or chills, no chest pain, no cough, no dyspnea, no abdominal pain, nausea, vomiting, or  diarrhea. Patient denies any significant use of alcohol or illicit substances. He is a former smoker.  ED Course: Upon arrival to the ED, patient is found to be afebrile, saturating well on room air, hypertensive to 190/100, and with normal heart rate and respirations. EKG  features a sinus rhythm with PVCs, LAE, and nonspecific T-wave abnormality anteriorly. Noncontrast head CT is negative for acute intracranial abnormality, but notable for mild chronic small vessel ischemic disease and mild paranasal sinusitis which is likely chronic. Chemistry panel was unremarkable and so a CBC. INR is within the normal limits, as is troponin. Neurology was consulted by the ED physician and advised a medical admission with MRI and further evaluation of strokelike symptoms. Patient has remained mildly hypertensive, but otherwise stable in the emergency department and will be observed on the telemetry unit for ongoing evaluation and management of suspected CVA.    Subjective:    John Finley today has still some difficulty with his speech, MRI showed CVA.  Pt is being followed by neurology.  Pt denies any weakness, numbness, tingling.       Assessment  & Plan :    Principal Problem:   Suspected cerebrovascular accident (CVA) ([place]) Active Problems:   Hypertension   Hypertensive urgency   Cerebrovascular accident (CVA) ([place])   OSA on CPAP   1.CVA left corona radiata to the left precentral gyrus Cardiac echo, carotid ultrasound pending Appreciate neurology input Speech therapy Cont aspirin, cont lipitor,   CXR Check lipid in am  2. Bradycardia ? Secondary to labetalol Check tsh Trop I q6h x3 D/c labetalol Hydralazine 10mg  iv q6h prn sbp >160 Awaiting echo.   3.   Hypertension Continue to monitor.    4. Hx of tobacco use in remote past CXR per pt request and part of stroke order set   Due to Bradycardia change to inpatient status  Code Status :  FULL CODE  Family Communication  : w  patient  Disposition Plan  :  home  Barriers For Discharge :   Consults  :  neurology  Procedures  :   DVT Prophylaxis  :    SCDs ,  D/c lovenox per pt request  Lab Results  Component Value Date   PLT 257 02/16/2017    Antibiotics  :    Anti-infectives    None        Objective:   Vitals:   02/16/17 2258 02/17/17 0008 02/17/17 0211 02/17/17 0418  BP: (!) 160/93 115/63 (!) 138/91 124/76  Pulse: 77 70 68 (!) 45  Resp: 18 16 16 18   Temp: 98.1 F (36.7 C)     TempSrc: Oral     SpO2: 99% 96% 98% 99%  Weight: 101.3 kg (223 lb 4.8 oz)     Height: 5\' 7"  (1.702 m)       Wt Readings from Last 3 Encounters:  02/16/17 101.3 kg (223 lb 4.8 oz)  10/05/16 99.8 kg (220 lb)     Intake/Output Summary (Last 24 hours) at 02/17/17 0952 Last data filed at 02/17/17 0947  Gross per 24 hour  Intake              240 ml  Output                0 ml  Net              240 ml     Physical Exam  Awake Alert, Oriented X 3, No new F.N deficits, Normal affect [place].AT,PERRAL Supple Neck,No JVD, No cervical lymphadenopathy appriciated.  Symmetrical Chest wall movement, Good air movement bilaterally, CTAB RRR,No Gallops,Rubs or new Murmurs, No Parasternal Heave +ve B.Sounds, Abd Soft, No tenderness, No organomegaly appriciated, No rebound - guarding or rigidity. No Cyanosis, Clubbing  or edema, No new Rash or bruise  Slight difficulty with word finding    Data Review:    CBC  Recent Labs Lab 02/16/17 1743 02/16/17 1802  WBC 9.4  --   HGB 14.3 15.0  HCT 42.2 44.0  PLT 257  --   MCV 85.9  --   MCH 29.1  --   MCHC 33.9  --   RDW 14.0  --   LYMPHSABS 3.4  --   MONOABS 0.5  --   EOSABS 0.2  --   BASOSABS 0.0  --     Chemistries   Recent Labs Lab 02/16/17 1743 02/16/17 1802  NA 139 141  K 3.7 3.7  CL 104 104  CO2 25  --   GLUCOSE 96 93  BUN 11 13  CREATININE 1.20 1.20  CALCIUM 9.7  --   AST 41  --   ALT 29  --   ALKPHOS 63  --   BILITOT 1.2  --     ------------------------------------------------------------------------------------------------------------------  Recent Labs  02/17/17 0444  CHOL 178  HDL 32*  LDLCALC 125*  TRIG 104  CHOLHDL 5.6    No results found for: HGBA1C ------------------------------------------------------------------------------------------------------------------ No results for input(s): TSH, T4TOTAL, T3FREE, THYROIDAB in the last 72 hours.  Invalid input(s): FREET3 ------------------------------------------------------------------------------------------------------------------ No results for input(s): VITAMINB12, FOLATE, FERRITIN, TIBC, IRON, RETICCTPCT in the last 72 hours.  Coagulation profile  Recent Labs Lab 02/16/17 1743  INR 1.02    No results for input(s): DDIMER in the last 72 hours.  Cardiac Enzymes No results for input(s): CKMB, TROPONINI, MYOGLOBIN in the last 168 hours.  Invalid input(s): CK ------------------------------------------------------------------------------------------------------------------ No results found for: BNP  Inpatient Medications  Scheduled Meds: .  stroke: mapping our early stages of recovery book   Does not apply Once  . aspirin  300 mg Rectal Daily   Or  . aspirin  325 mg Oral Daily  . atorvastatin  40 mg Oral q1800  . enoxaparin (LOVENOX) injection  40 mg Subcutaneous Daily   Continuous Infusions: PRN Meds:.acetaminophen **OR** acetaminophen (TYLENOL) oral liquid 160 mg/5 mL **OR** acetaminophen, HYDROcodone-acetaminophen, labetalol, LORazepam, senna-docusate  Micro Results No results found for this or any previous visit (from the past 240 hour(s)).  Radiology Reports Ct Head Wo Contrast  Result Date: 02/16/2017 CLINICAL DATA:  Right facial droop.  Difficulty speaking. EXAM: CT HEAD WITHOUT CONTRAST TECHNIQUE: Contiguous axial images were obtained from the base of the skull through the vertex without intravenous contrast. COMPARISON:   06/22/2005 head CT. FINDINGS: Brain: No evidence of parenchymal hemorrhage or extra-axial fluid collection. No mass lesion, mass effect, or midline shift. No CT evidence of acute infarction. Nonspecific mild subcortical and periventricular white matter hypodensity, most in keeping with chronic small vessel ischemic change. Cerebral volume is age appropriate. No ventriculomegaly. Vascular: No hyperdense vessel or unexpected calcification. Intracranial atherosclerosis. Skull: No evidence of calvarial fracture. Sinuses/Orbits: Mild mucoperiosteal thickening in the bilateral ethmoidal air cells and left maxillary sinus. No fluid levels in the visualized paranasal sinuses. Other:  The mastoid air cells are unopacified. IMPRESSION: 1.  No evidence of acute intracranial abnormality. 2. Mild chronic small vessel ischemia. 3. Mild paranasal sinusitis, probably chronic. Electronically Signed   By: Ilona Sorrel M.D.   On: 02/16/2017 18:49   Mr Angiogram Head Wo Contrast  Result Date: 02/16/2017 CLINICAL DATA:  Facial droop, unsteady gait and difficulty speaking. Hypertension. EXAM: MR HEAD WITHOUT CONTRAST MR CIRCLE OF WILLIS WITHOUT CONTRAST MRA OF THE NECK WITHOUT  AND WITH CONTRAST TECHNIQUE: Multiplanar, multiecho pulse sequences of the brain, circle of willis and surrounding structures were obtained without intravenous contrast. Angiographic images of the neck were obtained using MRA technique without and with intravenous contrast. CONTRAST:  74mL MULTIHANCE GADOBENATE DIMEGLUMINE 529 MG/ML IV SOLN COMPARISON:  Head CT 02/16/2017 FINDINGS: MR HEAD FINDINGS Brain: There is a small area of diffusion restriction within the left corona radiata extending superiorly to the precentral gyrus. There is multifocal hyperintense T2-weighted signal within the periventricular white matter, most often seen in the setting of chronic microvascular ischemia. No mass lesion or midline shift. No hydrocephalus or extra-axial fluid  collection. The midline structures are normal. No age advanced or lobar predominant atrophy. Vascular: Major intracranial arterial and venous sinus flow voids are preserved. No evidence of chronic microhemorrhage or amyloid angiopathy. Skull and upper cervical spine: The visualized skull base, calvarium, upper cervical spine and extracranial soft tissues are normal. Sinuses/Orbits: No fluid levels or advanced mucosal thickening. No mastoid effusion. Normal orbits. MR CIRCLE OF WILLIS FINDINGS Intracranial internal carotid arteries: Normal. Anterior cerebral arteries: Normal. Middle cerebral arteries: There is mild multifocal narrowing of both middle cerebral arteries. However, this is favored to be an artifact caused by patient motion. Otherwise, the MCAs are normal. Posterior communicating arteries: Present on the left. Posterior cerebral arteries: Normal. Basilar artery: Normal. Vertebral arteries: Left dominant. The right vertebral artery terminates in the right posterior-inferior cerebellar artery. Superior cerebellar arteries: Normal. Anterior inferior cerebellar arteries: Normal. Posterior inferior cerebellar arteries: Normal. MRA NECK FINDINGS The visualized aortic arch is normal. Proximal subclavian arteries are normal. Poor visualization of the vertebral artery origins. The vertebral system is left dominant without focal stenosis along either cervical segment. There is multifocal narrowing of the diminutive right vertebral artery V4 segment. There is no common or internal carotid artery stenosis. IMPRESSION: 1. Acute ischemia within the left corona radiata extending to the left precentral gyrus. This is in keeping with the reported right-sided facial symptoms. 2. No acute hemorrhage or mass effect. 3. No intracranial or cervical arterial occlusion or high-grade stenosis. 4. Apparent mild atherosclerotic narrowing of both middle cerebral arteries is favored to be an artifact due to motion. 5. Multifocal  narrowing of the non-dominant right vertebral artery V4 segment, prior to its termination in the right PICA. These results will be called to the ordering clinician or representative by the Radiologist Assistant, and communication documented in the PACS or zVision Dashboard. Electronically Signed   By: Ulyses Jarred M.D.   On: 02/16/2017 23:38   Mr Angiogram Neck W Or Wo Contrast  Result Date: 02/16/2017 CLINICAL DATA:  Facial droop, unsteady gait and difficulty speaking. Hypertension. EXAM: MR HEAD WITHOUT CONTRAST MR CIRCLE OF WILLIS WITHOUT CONTRAST MRA OF THE NECK WITHOUT AND WITH CONTRAST TECHNIQUE: Multiplanar, multiecho pulse sequences of the brain, circle of willis and surrounding structures were obtained without intravenous contrast. Angiographic images of the neck were obtained using MRA technique without and with intravenous contrast. CONTRAST:  62mL MULTIHANCE GADOBENATE DIMEGLUMINE 529 MG/ML IV SOLN COMPARISON:  Head CT 02/16/2017 FINDINGS: MR HEAD FINDINGS Brain: There is a small area of diffusion restriction within the left corona radiata extending superiorly to the precentral gyrus. There is multifocal hyperintense T2-weighted signal within the periventricular white matter, most often seen in the setting of chronic microvascular ischemia. No mass lesion or midline shift. No hydrocephalus or extra-axial fluid collection. The midline structures are normal. No age advanced or lobar predominant atrophy. Vascular: Major intracranial arterial and  venous sinus flow voids are preserved. No evidence of chronic microhemorrhage or amyloid angiopathy. Skull and upper cervical spine: The visualized skull base, calvarium, upper cervical spine and extracranial soft tissues are normal. Sinuses/Orbits: No fluid levels or advanced mucosal thickening. No mastoid effusion. Normal orbits. MR CIRCLE OF WILLIS FINDINGS Intracranial internal carotid arteries: Normal. Anterior cerebral arteries: Normal. Middle cerebral  arteries: There is mild multifocal narrowing of both middle cerebral arteries. However, this is favored to be an artifact caused by patient motion. Otherwise, the MCAs are normal. Posterior communicating arteries: Present on the left. Posterior cerebral arteries: Normal. Basilar artery: Normal. Vertebral arteries: Left dominant. The right vertebral artery terminates in the right posterior-inferior cerebellar artery. Superior cerebellar arteries: Normal. Anterior inferior cerebellar arteries: Normal. Posterior inferior cerebellar arteries: Normal. MRA NECK FINDINGS The visualized aortic arch is normal. Proximal subclavian arteries are normal. Poor visualization of the vertebral artery origins. The vertebral system is left dominant without focal stenosis along either cervical segment. There is multifocal narrowing of the diminutive right vertebral artery V4 segment. There is no common or internal carotid artery stenosis. IMPRESSION: 1. Acute ischemia within the left corona radiata extending to the left precentral gyrus. This is in keeping with the reported right-sided facial symptoms. 2. No acute hemorrhage or mass effect. 3. No intracranial or cervical arterial occlusion or high-grade stenosis. 4. Apparent mild atherosclerotic narrowing of both middle cerebral arteries is favored to be an artifact due to motion. 5. Multifocal narrowing of the non-dominant right vertebral artery V4 segment, prior to its termination in the right PICA. These results will be called to the ordering clinician or representative by the Radiologist Assistant, and communication documented in the PACS or zVision Dashboard. Electronically Signed   By: Ulyses Jarred M.D.   On: 02/16/2017 23:38   Mr Brain Wo Contrast  Result Date: 02/16/2017 CLINICAL DATA:  Facial droop, unsteady gait and difficulty speaking. Hypertension. EXAM: MR HEAD WITHOUT CONTRAST MR CIRCLE OF WILLIS WITHOUT CONTRAST MRA OF THE NECK WITHOUT AND WITH CONTRAST TECHNIQUE:  Multiplanar, multiecho pulse sequences of the brain, circle of willis and surrounding structures were obtained without intravenous contrast. Angiographic images of the neck were obtained using MRA technique without and with intravenous contrast. CONTRAST:  81mL MULTIHANCE GADOBENATE DIMEGLUMINE 529 MG/ML IV SOLN COMPARISON:  Head CT 02/16/2017 FINDINGS: MR HEAD FINDINGS Brain: There is a small area of diffusion restriction within the left corona radiata extending superiorly to the precentral gyrus. There is multifocal hyperintense T2-weighted signal within the periventricular white matter, most often seen in the setting of chronic microvascular ischemia. No mass lesion or midline shift. No hydrocephalus or extra-axial fluid collection. The midline structures are normal. No age advanced or lobar predominant atrophy. Vascular: Major intracranial arterial and venous sinus flow voids are preserved. No evidence of chronic microhemorrhage or amyloid angiopathy. Skull and upper cervical spine: The visualized skull base, calvarium, upper cervical spine and extracranial soft tissues are normal. Sinuses/Orbits: No fluid levels or advanced mucosal thickening. No mastoid effusion. Normal orbits. MR CIRCLE OF WILLIS FINDINGS Intracranial internal carotid arteries: Normal. Anterior cerebral arteries: Normal. Middle cerebral arteries: There is mild multifocal narrowing of both middle cerebral arteries. However, this is favored to be an artifact caused by patient motion. Otherwise, the MCAs are normal. Posterior communicating arteries: Present on the left. Posterior cerebral arteries: Normal. Basilar artery: Normal. Vertebral arteries: Left dominant. The right vertebral artery terminates in the right posterior-inferior cerebellar artery. Superior cerebellar arteries: Normal. Anterior inferior cerebellar arteries: Normal. Posterior  inferior cerebellar arteries: Normal. MRA NECK FINDINGS The visualized aortic arch is normal. Proximal  subclavian arteries are normal. Poor visualization of the vertebral artery origins. The vertebral system is left dominant without focal stenosis along either cervical segment. There is multifocal narrowing of the diminutive right vertebral artery V4 segment. There is no common or internal carotid artery stenosis. IMPRESSION: 1. Acute ischemia within the left corona radiata extending to the left precentral gyrus. This is in keeping with the reported right-sided facial symptoms. 2. No acute hemorrhage or mass effect. 3. No intracranial or cervical arterial occlusion or high-grade stenosis. 4. Apparent mild atherosclerotic narrowing of both middle cerebral arteries is favored to be an artifact due to motion. 5. Multifocal narrowing of the non-dominant right vertebral artery V4 segment, prior to its termination in the right PICA. These results will be called to the ordering clinician or representative by the Radiologist Assistant, and communication documented in the PACS or zVision Dashboard. Electronically Signed   By: Ulyses Jarred M.D.   On: 02/16/2017 23:38    Time Spent in minutes  30   Jani Gravel M.D on 02/17/2017 at 9:52 AM  Between 7am to 7pm - Pager - [phone]  After 7pm go to [email or web address] - password [facility]  [facility] -  Office  [phone]

## 2017-02-17 NOTE — Evaluation (Signed)
Physical Therapy Evaluation Patient Details Name: John Finley MRN: [id number] DOB: August 29, 1951 Today's Date: 02/17/2017   History of Present Illness  66 y.o. male who presented to the ED for assessment of new onset facial droop with drooling from the right side of his mouth, in addition to unsteady gait, mild dysarthria and halting speech.  PMH consists of HTN, OSA, cardiac dysrhythmia, CAD, prediabetes, and s/p L ankle ORIF 2012.   Clinical Impression  PT eval complete. Pt is independent with all functional mobility. No skilled PT intervention indicated. PT signing off.    Follow Up Recommendations No PT follow up    Equipment Recommendations  None recommended by PT    Recommendations for Other Services       Precautions / Restrictions Precautions Precautions: None      Mobility  Bed Mobility Overal bed mobility: Independent                Transfers Overall transfer level: Independent Equipment used: None                Ambulation/Gait Ambulation/Gait assistance: Independent   Assistive device: None Gait Pattern/deviations: WFL(Within Functional Limits)   Gait velocity interpretation: >2.62 ft/sec, indicative of independent community ambulator General Gait Details: steady gait  Stairs Stairs: Yes Stairs assistance: Independent Stair Management: No rails;Alternating pattern;Forwards Number of Stairs: 12    Wheelchair Mobility    Modified Rankin (Stroke Patients Only) Modified Rankin (Stroke Patients Only) Pre-Morbid Rankin Score: No symptoms Modified Rankin: No significant disability     Balance Overall balance assessment: Independent                               Standardized Balance Assessment Standardized Balance Assessment : Dynamic Gait Index   Dynamic Gait Index Level Surface: Normal Change in Gait Speed: Normal Gait with Horizontal Head Turns: Normal Gait with Vertical Head Turns: Normal Gait and Pivot Turn:  Normal Step Over Obstacle: Normal Step Around Obstacles: Normal Steps: Normal Total Score: 24       Pertinent Vitals/Pain Pain Assessment: No/denies pain    Home Living Family/patient expects to be discharged to:: Private residence Living Arrangements: Spouse/significant other Available Help at Discharge: Family;Available 24 hours/day Type of Home: House Home Access: Stairs to enter   [employer] of Steps: 3 Home Layout: One level;Laundry or work area in [employer]: None      Prior Function Level of Independence: Independent         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     Hand Dominance        Extremity/Trunk Assessment   Upper Extremity Assessment Upper Extremity Assessment: Overall WFL for tasks assessed    Lower Extremity Assessment Lower Extremity Assessment: Overall WFL for tasks assessed    Cervical / Trunk Assessment Cervical / Trunk Assessment: Normal  Communication   Communication: No difficulties  Cognition Arousal/Alertness: Awake/alert Behavior During Therapy: WFL for tasks assessed/performed Overall Cognitive Status: Within Functional Limits for tasks assessed                      General Comments      Exercises     Assessment/Plan    PT Assessment Patent does not need any further PT services  PT Problem List         PT Treatment Interventions      PT Goals (Current goals can be found in the  Care Plan section)  Acute Rehab PT Goals Patient Stated Goal: address his slurred speech PT Goal Formulation: All assessment and education complete, DC therapy    Frequency     Barriers to discharge        Co-evaluation               End of Session   Activity Tolerance: Patient tolerated treatment well Patient left: in chair;with call bell/phone within reach Nurse Communication: Mobility status PT Visit Diagnosis: Difficulty in walking, not elsewhere classified (R26.2)    Functional  Assessment Tool Used: AM-PAC 6 Clicks Basic Mobility Functional Limitation: Mobility: Walking and moving around Mobility: Walking and Moving Around Current Status [id number]): 0 percent impaired, limited or restricted Mobility: Walking and Moving Around Goal Status [phone]): 0 percent impaired, limited or restricted Mobility: Walking and Moving Around Discharge Status [phone]): 0 percent impaired, limited or restricted    Time: 1002-1012 PT Time Calculation (min) (ACUTE ONLY): 10 min   Charges:   PT Evaluation $PT Eval Low Complexity: 1 Procedure     PT G Codes:   PT G-Codes **NOT FOR INPATIENT CLASS** Functional Assessment Tool Used: AM-PAC 6 Clicks Basic Mobility Functional Limitation: Mobility: Walking and moving around Mobility: Walking and Moving Around Current Status [id number]): 0 percent impaired, limited or restricted Mobility: Walking and Moving Around Goal Status [id number]): 0 percent impaired, limited or restricted Mobility: Walking and Moving Around Discharge Status [id number]): 0 percent impaired, limited or restricted     Lorriane Shire 02/17/2017, 10:33 AM

## 2017-02-17 NOTE — Progress Notes (Signed)
MRI resulted, revealing the following:  1. Acute ischemia within the left corona radiata extending to the left precentral gyrus. This is in keeping with the reported right-sided facial symptoms. 2. No acute hemorrhage or mass effect. 3. No intracranial or cervical arterial occlusion or high-grade stenosis. 4. Apparent mild atherosclerotic narrowing of both middle cerebral arteries is favored to be an artifact due to motion. 5. Multifocal narrowing of the non-dominant right vertebral artery V4 segment, prior to its termination in the right PICA.  I have reviewed the images. I agree with the above and also note significant intracranial and extracranial atherosclerotic disease.   Will need to be managed with high dose statin and scheduled ASA indefinitely. Also will need to optimize his blood pressures with antihypertensives as an outpatient. Permissive HTN for now. Await echocardiogram.   Electronically signed: Dr. Kerney Elbe

## 2017-02-17 NOTE — Progress Notes (Signed)
  Echocardiogram 2D Echocardiogram has been performed.  Joelene Millin 02/17/2017, 9:43 AM

## 2017-02-18 ENCOUNTER — Encounter (HOSPITAL_COMMUNITY): Payer: Self-pay

## 2017-02-18 DIAGNOSIS — R7989 Other specified abnormal findings of blood chemistry: Secondary | ICD-10-CM

## 2017-02-18 DIAGNOSIS — R778 Other specified abnormalities of plasma proteins: Secondary | ICD-10-CM

## 2017-02-18 LAB — CBC
HCT: 40.6 % (ref 39.0–52.0)
Hemoglobin: 13.8 g/dL (ref 13.0–17.0)
MCH: 29.4 pg (ref 26.0–34.0)
MCHC: 34 g/dL (ref 30.0–36.0)
MCV: 86.4 fL (ref 78.0–100.0)
Platelets: 248 10*3/uL (ref 150–400)
RBC: 4.7 MIL/uL (ref 4.22–5.81)
RDW: 14 % (ref 11.5–15.5)
WBC: 8.4 10*3/uL (ref 4.0–10.5)

## 2017-02-18 LAB — COMPREHENSIVE METABOLIC PANEL
ALT: 24 U/L (ref 17–63)
ANION GAP: 6 (ref 5–15)
AST: 25 U/L (ref 15–41)
Albumin: 3.8 g/dL (ref 3.5–5.0)
Alkaline Phosphatase: 59 U/L (ref 38–126)
BUN: 13 mg/dL (ref 6–20)
CO2: 25 mmol/L (ref 22–32)
Calcium: 9.3 mg/dL (ref 8.9–10.3)
Chloride: 108 mmol/L (ref 101–111)
Creatinine, Ser: 1.21 mg/dL (ref 0.61–1.24)
GFR calc Af Amer: >60 mL/min (ref >60)
GFR calc non Af Amer: >60 mL/min (ref >60)
GLUCOSE: 121 mg/dL — AB (ref 65–99)
POTASSIUM: 4.1 mmol/L (ref 3.5–5.1)
SODIUM: 139 mmol/L (ref 135–145)
TOTAL PROTEIN: 6.7 g/dL (ref 6.5–8.1)
Total Bilirubin: 1.2 mg/dL (ref 0.3–1.2)

## 2017-02-18 LAB — VITAMIN B12: VITAMIN B 12: 449 pg/mL (ref 180–914)

## 2017-02-18 LAB — SEDIMENTATION RATE: SED RATE: 10 mm/h (ref 0–16)

## 2017-02-18 LAB — HEMOGLOBIN A1C
HEMOGLOBIN A1C: 6.8 % — AB (ref 4.8–5.6)
MEAN PLASMA GLUCOSE: 148 mg/dL

## 2017-02-18 MED ORDER — LOSARTAN POTASSIUM 50 MG PO TABS
25.0000 mg | ORAL_TABLET | Freq: Two times a day (BID) | ORAL | Status: DC
  Administered 2017-02-18 – 2017-02-19 (×2): 25 mg via ORAL
  Filled 2017-02-18 (×2): qty 1

## 2017-02-18 MED ORDER — LOSARTAN POTASSIUM 50 MG PO TABS
25.0000 mg | ORAL_TABLET | Freq: Every day | ORAL | Status: DC
  Administered 2017-02-18: 25 mg via ORAL
  Filled 2017-02-18: qty 1

## 2017-02-18 NOTE — Care Management Note (Signed)
Case Management Note  Patient Details  Name: John Finley MRN: [id number] Date of Birth: 1951/11/29  Subjective/Objective:                  Patient was admitted with CVA. Lives at home with spouse. CM will follow for discharge needs pending PT/OT evals and physician orders.   Action/Plan:   Expected Discharge Date:  02/17/17               Expected Discharge Plan:     In-House Referral:     Discharge planning Services     Post Acute Care Choice:    Choice offered to:     DME Arranged:    DME Agency:     HH Arranged:    HH Agency:     Status of Service:     If discussed at [employer], dates discussed:    Additional Comments:  Rolm Baptise, RN 02/18/2017, 11:12 AM

## 2017-02-18 NOTE — Evaluation (Signed)
Speech Language Pathology Evaluation Patient Details Name: John Finley MRN: [id number] DOB: January 24, 1951 Today's Date: 02/18/2017 Time: 1030-1105 SLP Time Calculation (min) (ACUTE ONLY): 35 min  Problem List:  Patient Active Problem List   Diagnosis Date Noted  . Stroke (cerebrum) ([place]) 02/17/2017  . Cerebrovascular accident (CVA) ([place])   . OSA on CPAP   . Suspected cerebrovascular accident (CVA) ([place]) 02/16/2017  . Hypertension 02/16/2017  . Hypertensive urgency 02/16/2017   Past Medical History:  Past Medical History:  Diagnosis Date  . Hypertension   . Prediabetes    Past Surgical History:  Past Surgical History:  Procedure Laterality Date  . ANKLE SURGERY     HPI:  66 year old male admitted 02/16/17 due to right facial weakness and dysarthria. PMH significant for HTN, OSA, prediabetes, degenerative disc disease. MRI reveals Left corona radiata CVA. SLE requested due to dysarthria and expressive aphasia   Assessment / Plan / Recommendation Clinical Impression  Pt presents with minimal dysarthria, but fully intelligible speech. Pt reports occasional difficulty with word finding, however, he indicates this is improving, and does not exhibit difficulty during this evaluation. Pt was given oral motor strengthening exercises to improve labial seal, as he reports right anterior leakage at times. No difficulty swallowing was observed or reported. No further ST intervention is recommended at this time, however, pt was encouraged to notify MD for possible outpatient speech if deficits become worse or interfere with functional and effective communication.     SLP Assessment  SLP Recommendation/Assessment: Patient does not need any further Speech Lanaguage Pathology Services SLP Visit Diagnosis: Aphasia (R47.01);Dysarthria and anarthria (R47.1)       SLP Evaluation Cognition  Overall Cognitive Status: Within Functional Limits for tasks assessed Orientation Level: Oriented X4        Comprehension  Auditory Comprehension Overall Auditory Comprehension: Appears within functional limits for tasks assessed    Expression Expression Primary Mode of Expression: Verbal Verbal Expression Overall Verbal Expression: Appears within functional limits for tasks assessed Other Verbal Expression Comments: Pt reports he must focus for words to come out right. No word-finding deficits noted during this assessment.   Oral / Motor  Oral Motor/Sensory Function Overall Oral Motor/Sensory Function: Within functional limits Motor Speech Overall Motor Speech: Impaired Respiration: Within functional limits Phonation: Normal Resonance: Within functional limits Articulation: Within functional limitis Intelligibility: Intelligible Motor Planning: Witnin functional limits Motor Speech Errors: Not applicable Effective Techniques: Slow rate;Over-articulate   Latron Ribas B. Quentin Ore [facility], CCC-SLP [id number]         Shonna Chock 02/18/2017, 11:13 AM

## 2017-02-18 NOTE — Progress Notes (Addendum)
Patient ID: John Finley, male   DOB: 02-06-1951, 66 y.o.   MRN: [id number]                                                                PROGRESS NOTE                                                                                                                                                                                                             Patient Demographics:    John Finley, is a 66 y.o. male, DOB - 08-04-1951, [id number]  Admit date - 02/16/2017   Admitting Physician Vianne Bulls, MD  Outpatient Primary MD for the patient is Foye Spurling, MD  LOS - 1  Outpatient Specialists:  Chief Complaint  Patient presents with  . Stroke Symptoms       Brief Narrative  66 y.o.maleand local cardiologistwith medical history significant for hypertension, OSA on CPAP, prediabetes, and cervical and lumbar degenerative disc disease who presents to the emergency department with right facial droop and slurred speech. Patient reports that he had been in his usual state of health until yesterday when he was playing with his grandson and his grandson noted him to be drooling from the right side of his mouth. Patient and his family then noted a slight right facial droop. He was also noted to have difficulty speaking with slurring. Patient also reports difficulty "getting the words out," but denies trouble with word-finding. These symptoms were first noted shortly after he woke yesterday. He reports occasional palpitations which she has attributed to PACs, but denies any headache or change in vision or hearing. There has not been any significant focal numbness or weakness other than in the right face. There's been no recent fall or trauma and he has never experienced similar symptoms previously. He reports taking an aspirin this afternoon before coming in for evaluation. There's been no recent fevers or chills, no chest pain, no cough, no dyspnea, no abdominal pain, nausea, vomiting, or  diarrhea. Patient denies any significant use of alcohol or illicit substances. He is a former smoker.  ED Course:Upon arrival to the ED, patient is found to be afebrile, saturating well on room air, hypertensive to 190/100, and with normal heart rate and respirations. EKG features a sinus rhythm with PVCs,  LAE, and nonspecific T-wave abnormality anteriorly. Noncontrast head CT is negative for acute intracranial abnormality, but notable for mild chronic small vessel ischemic disease and mild paranasal sinusitis which is likely chronic. Chemistry panel was unremarkable and so a CBC. INR is within the normal limits, as is troponin. Neurology was consulted by the ED physician and advised a medical admission with MRI and further evaluation of strokelike symptoms. Patient has remained mildly hypertensive, but otherwise stable in the emergency department and will be observed on the telemetry unit for ongoing evaluation and management of suspected CVA.    Subjective:    John Finley today has been feeling better. Facial droop right side still present.  Speaking slightly more fluent,  Pt had bradycardia the other day. ?  Trop I low positive.  Pt is asymptomatic.  , No headache, No chest pain, No abdominal pain - No Nausea, No new weakness tingling or numbness.    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 Bradycardia ? Secondary to labetalol Resolved.   3. Troponin elevation (no chest pain) Mild, Ekg this am NSR at  70, nl axis,  Early R progression T inversion in V4-6 Cardiology has been consulted Cont aspirin, lipitor  4.    Hypertension Continue to monitor.   May need upward titration of cozaar  5. Hx of tobacco use in remote past CXR negative   Addendum,  TEE tomorrow,  By cardiology, appreciate input They will also arrange for loop recorder  Code Status :  FULL CODE  Family Communication  : w patient  Disposition Plan  :  home  Barriers For Discharge :   Consults  :  neurology  Procedures  :   DVT Prophylaxis  :    SCDs ,  D/c lovenox per pt request .     Lab Results  Component Value Date   PLT 248 02/18/2017    Antibiotics  :    Anti-infectives    None        Objective:   Vitals:   02/17/17 2300 02/18/17 0101 02/18/17 0512 02/18/17 0943  BP:  (!) 145/94 (!) 145/89 (!) 156/85  Pulse:  61 79 72  Resp:  20 20 19   Temp:  98 F (36.7 C) 97.9 F (36.6 C) 98.1 F (36.7 C)  TempSrc:  Oral Oral Oral  SpO2: 100% 100% 100% 95%  Weight:      Height:        Wt Readings from Last 3 Encounters:  02/16/17 101.3 kg (223 lb 4.8 oz)  10/05/16 99.8 kg (220 lb)    No intake or output data in the 24 hours ending 02/18/17 1242   Physical Exam  Awake Alert, Oriented X 3, No new F.N deficits, Normal affect [place].AT,PERRAL  Right facial droop Supple Neck,No JVD, No cervical lymphadenopathy appriciated.  Symmetrical Chest wall movement, Good air movement bilaterally, CTAB RRR,No Gallops,Rubs or new Murmurs, No Parasternal Heave +ve B.Sounds, Abd Soft, No tenderness, No organomegaly appriciated, No rebound - guarding or rigidity. No Cyanosis, Clubbing or edema, No new Rash or bruise     Data Review:  CBC  Recent Labs Lab 02/16/17 1743 02/16/17 1802 02/18/17 0344  WBC 9.4  --  8.4  HGB 14.3 15.0 13.8  HCT 42.2 44.0 40.6  PLT 257  --  248  MCV 85.9  --  86.4  MCH 29.1  --  29.4  MCHC 33.9  --  34.0  RDW 14.0  --  14.0  LYMPHSABS 3.4  --   --   MONOABS 0.5  --   --   EOSABS 0.2  --   --   BASOSABS 0.0  --   --     Chemistries   Recent Labs Lab  02/16/17 1743 02/16/17 1802 02/18/17 0344  NA 139 141 139  K 3.7 3.7 4.1  CL 104 104 108  CO2 25  --  25  GLUCOSE 96 93 121*  BUN 11 13 13   CREATININE 1.20 1.20 1.21  CALCIUM 9.7  --  9.3  AST 41  --  25  ALT 29  --  24  ALKPHOS 63  --  59  BILITOT 1.2  --  1.2   ------------------------------------------------------------------------------------------------------------------  Recent Labs  02/17/17 0444  CHOL 178  HDL 32*  LDLCALC 125*  TRIG 104  CHOLHDL 5.6    Lab Results  Component Value Date   HGBA1C 6.8 (H) 02/17/2017   ------------------------------------------------------------------------------------------------------------------  Recent Labs  02/17/17 1033  TSH 2.789   ------------------------------------------------------------------------------------------------------------------  Recent Labs  02/18/17 0344  VITAMINB12 449    Coagulation profile  Recent Labs Lab 02/16/17 1743  INR 1.02    No results for input(s): DDIMER in the last 72 hours.  Cardiac Enzymes  Recent Labs Lab 02/17/17 1033 02/17/17 1551 02/17/17 2138  TROPONINI 0.07* 0.07* 0.06*   ------------------------------------------------------------------------------------------------------------------ No results found for: BNP  Inpatient Medications  Scheduled Meds: .  stroke: mapping our early stages of recovery book   Does not apply Once  . aspirin  300 mg Rectal Daily   Or  . aspirin  325 mg Oral Daily  . atorvastatin  80 mg Oral q1800  . losartan  25 mg Oral Daily   Continuous Infusions: PRN Meds:.acetaminophen **OR** acetaminophen (TYLENOL) oral liquid 160 mg/5 mL **OR** acetaminophen, hydrALAZINE, HYDROcodone-acetaminophen, LORazepam, senna-docusate  Micro Results No results found for this or any previous visit (from the past 240 hour(s)).  Radiology Reports Dg Chest 2 View  Result Date: 02/17/2017 CLINICAL DATA:  Recent stroke.  COPD. EXAM: CHEST  2 VIEW  COMPARISON:  03/09/2016 FINDINGS: Cardiac silhouette is normal in size. Thoracic tortuous crash that thoracic aortic tortuosity is unchanged. The lungs are hyperinflated without evidence of airspace consolidation, edema, pleural effusion, or pneumothorax. No acute osseous abnormality is identified. IMPRESSION: No active cardiopulmonary disease. Electronically Signed   By: Logan Bores M.D.   On: 02/17/2017 14:19   Ct Head Wo Contrast  Result Date: 02/16/2017 CLINICAL DATA:  Right facial droop.  Difficulty speaking. EXAM: CT HEAD WITHOUT CONTRAST TECHNIQUE: Contiguous axial images were obtained from the base of the skull through the vertex without intravenous contrast. COMPARISON:  06/22/2005 head CT. FINDINGS: Brain: No evidence of parenchymal hemorrhage or extra-axial fluid collection. No mass lesion, mass effect, or midline shift. No CT evidence of acute infarction. Nonspecific mild subcortical and periventricular white matter hypodensity, most in keeping with chronic small vessel ischemic change. Cerebral volume is age appropriate. No ventriculomegaly. Vascular: No hyperdense vessel or unexpected calcification. Intracranial atherosclerosis. Skull: No evidence of calvarial fracture. Sinuses/Orbits: Mild mucoperiosteal thickening in the bilateral ethmoidal air cells  and left maxillary sinus. No fluid levels in the visualized paranasal sinuses. Other:  The mastoid air cells are unopacified. IMPRESSION: 1.  No evidence of acute intracranial abnormality. 2. Mild chronic small vessel ischemia. 3. Mild paranasal sinusitis, probably chronic. Electronically Signed   By: Ilona Sorrel M.D.   On: 02/16/2017 18:49   Mr Angiogram Head Wo Contrast  Result Date: 02/16/2017 CLINICAL DATA:  Facial droop, unsteady gait and difficulty speaking. Hypertension. EXAM: MR HEAD WITHOUT CONTRAST MR CIRCLE OF WILLIS WITHOUT CONTRAST MRA OF THE NECK WITHOUT AND WITH CONTRAST TECHNIQUE: Multiplanar, multiecho pulse sequences of the  brain, circle of willis and surrounding structures were obtained without intravenous contrast. Angiographic images of the neck were obtained using MRA technique without and with intravenous contrast. CONTRAST:  45mL MULTIHANCE GADOBENATE DIMEGLUMINE 529 MG/ML IV SOLN COMPARISON:  Head CT 02/16/2017 FINDINGS: MR HEAD FINDINGS Brain: There is a small area of diffusion restriction within the left corona radiata extending superiorly to the precentral gyrus. There is multifocal hyperintense T2-weighted signal within the periventricular white matter, most often seen in the setting of chronic microvascular ischemia. No mass lesion or midline shift. No hydrocephalus or extra-axial fluid collection. The midline structures are normal. No age advanced or lobar predominant atrophy. Vascular: Major intracranial arterial and venous sinus flow voids are preserved. No evidence of chronic microhemorrhage or amyloid angiopathy. Skull and upper cervical spine: The visualized skull base, calvarium, upper cervical spine and extracranial soft tissues are normal. Sinuses/Orbits: No fluid levels or advanced mucosal thickening. No mastoid effusion. Normal orbits. MR CIRCLE OF WILLIS FINDINGS Intracranial internal carotid arteries: Normal. Anterior cerebral arteries: Normal. Middle cerebral arteries: There is mild multifocal narrowing of both middle cerebral arteries. However, this is favored to be an artifact caused by patient motion. Otherwise, the MCAs are normal. Posterior communicating arteries: Present on the left. Posterior cerebral arteries: Normal. Basilar artery: Normal. Vertebral arteries: Left dominant. The right vertebral artery terminates in the right posterior-inferior cerebellar artery. Superior cerebellar arteries: Normal. Anterior inferior cerebellar arteries: Normal. Posterior inferior cerebellar arteries: Normal. MRA NECK FINDINGS The visualized aortic arch is normal. Proximal subclavian arteries are normal. Poor  visualization of the vertebral artery origins. The vertebral system is left dominant without focal stenosis along either cervical segment. There is multifocal narrowing of the diminutive right vertebral artery V4 segment. There is no common or internal carotid artery stenosis. IMPRESSION: 1. Acute ischemia within the left corona radiata extending to the left precentral gyrus. This is in keeping with the reported right-sided facial symptoms. 2. No acute hemorrhage or mass effect. 3. No intracranial or cervical arterial occlusion or high-grade stenosis. 4. Apparent mild atherosclerotic narrowing of both middle cerebral arteries is favored to be an artifact due to motion. 5. Multifocal narrowing of the non-dominant right vertebral artery V4 segment, prior to its termination in the right PICA. These results will be called to the ordering clinician or representative by the Radiologist Assistant, and communication documented in the PACS or zVision Dashboard. Electronically Signed   By: Ulyses Jarred M.D.   On: 02/16/2017 23:38   Mr Angiogram Neck W Or Wo Contrast  Result Date: 02/16/2017 CLINICAL DATA:  Facial droop, unsteady gait and difficulty speaking. Hypertension. EXAM: MR HEAD WITHOUT CONTRAST MR CIRCLE OF WILLIS WITHOUT CONTRAST MRA OF THE NECK WITHOUT AND WITH CONTRAST TECHNIQUE: Multiplanar, multiecho pulse sequences of the brain, circle of willis and surrounding structures were obtained without intravenous contrast. Angiographic images of the neck were obtained using MRA technique without  and with intravenous contrast. CONTRAST:  69mL MULTIHANCE GADOBENATE DIMEGLUMINE 529 MG/ML IV SOLN COMPARISON:  Head CT 02/16/2017 FINDINGS: MR HEAD FINDINGS Brain: There is a small area of diffusion restriction within the left corona radiata extending superiorly to the precentral gyrus. There is multifocal hyperintense T2-weighted signal within the periventricular white matter, most often seen in the setting of chronic  microvascular ischemia. No mass lesion or midline shift. No hydrocephalus or extra-axial fluid collection. The midline structures are normal. No age advanced or lobar predominant atrophy. Vascular: Major intracranial arterial and venous sinus flow voids are preserved. No evidence of chronic microhemorrhage or amyloid angiopathy. Skull and upper cervical spine: The visualized skull base, calvarium, upper cervical spine and extracranial soft tissues are normal. Sinuses/Orbits: No fluid levels or advanced mucosal thickening. No mastoid effusion. Normal orbits. MR CIRCLE OF WILLIS FINDINGS Intracranial internal carotid arteries: Normal. Anterior cerebral arteries: Normal. Middle cerebral arteries: There is mild multifocal narrowing of both middle cerebral arteries. However, this is favored to be an artifact caused by patient motion. Otherwise, the MCAs are normal. Posterior communicating arteries: Present on the left. Posterior cerebral arteries: Normal. Basilar artery: Normal. Vertebral arteries: Left dominant. The right vertebral artery terminates in the right posterior-inferior cerebellar artery. Superior cerebellar arteries: Normal. Anterior inferior cerebellar arteries: Normal. Posterior inferior cerebellar arteries: Normal. MRA NECK FINDINGS The visualized aortic arch is normal. Proximal subclavian arteries are normal. Poor visualization of the vertebral artery origins. The vertebral system is left dominant without focal stenosis along either cervical segment. There is multifocal narrowing of the diminutive right vertebral artery V4 segment. There is no common or internal carotid artery stenosis. IMPRESSION: 1. Acute ischemia within the left corona radiata extending to the left precentral gyrus. This is in keeping with the reported right-sided facial symptoms. 2. No acute hemorrhage or mass effect. 3. No intracranial or cervical arterial occlusion or high-grade stenosis. 4. Apparent mild atherosclerotic narrowing  of both middle cerebral arteries is favored to be an artifact due to motion. 5. Multifocal narrowing of the non-dominant right vertebral artery V4 segment, prior to its termination in the right PICA. These results will be called to the ordering clinician or representative by the Radiologist Assistant, and communication documented in the PACS or zVision Dashboard. Electronically Signed   By: Ulyses Jarred M.D.   On: 02/16/2017 23:38   Mr Brain Wo Contrast  Result Date: 02/16/2017 CLINICAL DATA:  Facial droop, unsteady gait and difficulty speaking. Hypertension. EXAM: MR HEAD WITHOUT CONTRAST MR CIRCLE OF WILLIS WITHOUT CONTRAST MRA OF THE NECK WITHOUT AND WITH CONTRAST TECHNIQUE: Multiplanar, multiecho pulse sequences of the brain, circle of willis and surrounding structures were obtained without intravenous contrast. Angiographic images of the neck were obtained using MRA technique without and with intravenous contrast. CONTRAST:  42mL MULTIHANCE GADOBENATE DIMEGLUMINE 529 MG/ML IV SOLN COMPARISON:  Head CT 02/16/2017 FINDINGS: MR HEAD FINDINGS Brain: There is a small area of diffusion restriction within the left corona radiata extending superiorly to the precentral gyrus. There is multifocal hyperintense T2-weighted signal within the periventricular white matter, most often seen in the setting of chronic microvascular ischemia. No mass lesion or midline shift. No hydrocephalus or extra-axial fluid collection. The midline structures are normal. No age advanced or lobar predominant atrophy. Vascular: Major intracranial arterial and venous sinus flow voids are preserved. No evidence of chronic microhemorrhage or amyloid angiopathy. Skull and upper cervical spine: The visualized skull base, calvarium, upper cervical spine and extracranial soft tissues are normal. Sinuses/Orbits: No fluid  levels or advanced mucosal thickening. No mastoid effusion. Normal orbits. MR CIRCLE OF WILLIS FINDINGS Intracranial internal  carotid arteries: Normal. Anterior cerebral arteries: Normal. Middle cerebral arteries: There is mild multifocal narrowing of both middle cerebral arteries. However, this is favored to be an artifact caused by patient motion. Otherwise, the MCAs are normal. Posterior communicating arteries: Present on the left. Posterior cerebral arteries: Normal. Basilar artery: Normal. Vertebral arteries: Left dominant. The right vertebral artery terminates in the right posterior-inferior cerebellar artery. Superior cerebellar arteries: Normal. Anterior inferior cerebellar arteries: Normal. Posterior inferior cerebellar arteries: Normal. MRA NECK FINDINGS The visualized aortic arch is normal. Proximal subclavian arteries are normal. Poor visualization of the vertebral artery origins. The vertebral system is left dominant without focal stenosis along either cervical segment. There is multifocal narrowing of the diminutive right vertebral artery V4 segment. There is no common or internal carotid artery stenosis. IMPRESSION: 1. Acute ischemia within the left corona radiata extending to the left precentral gyrus. This is in keeping with the reported right-sided facial symptoms. 2. No acute hemorrhage or mass effect. 3. No intracranial or cervical arterial occlusion or high-grade stenosis. 4. Apparent mild atherosclerotic narrowing of both middle cerebral arteries is favored to be an artifact due to motion. 5. Multifocal narrowing of the non-dominant right vertebral artery V4 segment, prior to its termination in the right PICA. These results will be called to the ordering clinician or representative by the Radiologist Assistant, and communication documented in the PACS or zVision Dashboard. Electronically Signed   By: Ulyses Jarred M.D.   On: 02/16/2017 23:38    Time Spent in minutes  30   Jani Gravel M.D on 02/18/2017 at 12:42 PM  Between 7am to 7pm - Pager - [phone]  After 7pm go to [email or web address] - password [facility]  [facility] -  Office  [phone]

## 2017-02-18 NOTE — Consult Note (Signed)
CARDIOLOGY CONSULT NOTE   Patient ID: John Finley MRN: [id number] DOB/AGE: [birth year] 66 y.o.  Admit date: 02/16/2017  Primary Physician   Foye Spurling, MD Primary Cardiologist   New to [facility]  Reason for Consultation  Elevated troponin  Requesting Physician  Dr. Maudie Mercury  HPI: John Finley is a 66 y.o. local  Independent cardiologist with PMH of HTN, HLD, obesity, chronic back pain and OSA on CPAP presented for right facial droop and slurred speech 02/16/17. Admitted for acute ischemia within the left corona radiata extending to the left precentral gyrus. Followed by neurology. Echo showed normal LV function in the range of 55-605, no WM abnormality. No cardiac source of emboli. Troponin 0.07-->0. 07-->0.06. LDL 125. EKG showed sinus rhythm with PACs and TWI in lead V4-V6. Compare to prior EKG TWI in lead V4 and V5 is new seems LVH pattern. Cardiology is asked for further evaluation.  Patient was seen previously by Dr. Einar Gip for chest pain. He wants to followed by [facility] heart care. Cardiac catheterization in 2006 showed mild nonobstructive CAD (50-60% stenosis in the proximal and mid RCA and mid circumflex coronary artery).  Patient states that he has a chronic  T-wave inversion in inferior leads since childhood. Not noted in EKG review. Patient also has a history of intermittent palpitation "skipped beat'. Recently worsened. No syncope or dizziness.  Review of telemetry shows controlled rate with frequent PACs. No arrhythmia noted. Prior tobacco smoker quit in 2006.   Past Medical History:  Diagnosis Date  . Hypertension   . Prediabetes      Past Surgical History:  Procedure Laterality Date  . ANKLE SURGERY      Allergies  Allergen Reactions  . Penicillins Itching and Rash  . Sulfa Antibiotics Hives, Itching and Rash    I have reviewed the patient's current medications .  stroke: mapping our early stages of recovery book   Does not apply Once  . aspirin  300 mg Rectal  Daily   Or  . aspirin  325 mg Oral Daily  . atorvastatin  80 mg Oral q1800  . losartan  25 mg Oral BID    acetaminophen **OR** acetaminophen (TYLENOL) oral liquid 160 mg/5 mL **OR** acetaminophen, hydrALAZINE, HYDROcodone-acetaminophen, LORazepam, senna-docusate  Prior to Admission medications   Medication Sig Start Date End Date Taking? Authorizing Provider  amLODipine-valsartan (EXFORGE) 10-320 MG tablet Take 1 tablet by mouth daily.   Yes Historical Provider, MD  aspirin EC 81 MG tablet Take 81 mg by mouth once.   Yes Historical Provider, MD     Social History   Social History  . Marital status: Married    Spouse name: N/A  . Number of children: N/A  . Years of education: N/A   Occupational History  . Not on file.   Social History Main Topics  . Smoking status: Never Smoker  . Smokeless tobacco: Never Used  . Alcohol use No  . Drug use: No  . Sexual activity: Not on file   Other Topics Concern  . Not on file   Social History Narrative  . No narrative on file    No family status information on file.   No family hx of CAD.   ROS:  Full 14 point review of systems complete and found to be negative unless listed above.  Physical Exam: Blood pressure (!) 156/85, pulse 72, temperature 98.1 F (36.7 C), temperature source Oral, resp. rate 19, height 5\' 7"  (1.702 m), weight 223  lb 4.8 oz (101.3 kg), SpO2 95 %.  General: Well developed, well nourished, male in no acute distress Head: Eyes PERRLA, No xanthomas. Normocephalic and atraumatic, oropharynx without edema or exudate.  Lungs: Resp regular and unlabored, CTA. Heart: RRR no s3, s4, or murmurs..   Neck: No carotid bruits. No lymphadenopathy.  JVD. Abdomen: Bowel sounds present, abdomen soft and non-tender without masses or hernias noted. Msk:  No spine or cva tenderness. No weakness, no joint deformities or effusions. Extremities: No clubbing, cyanosis or edema. DP/PT/Radials 2+ and equal bilaterally. Neuro:  Alert and oriented X 3. No focal deficits noted. Psych:  Good affect, responds appropriately Skin: No rashes or lesions noted.  Labs:   Lab Results  Component Value Date   WBC 8.4 02/18/2017   HGB 13.8 02/18/2017   HCT 40.6 02/18/2017   MCV 86.4 02/18/2017   PLT 248 02/18/2017    Recent Labs  02/16/17 1743  INR 1.02    Recent Labs Lab 02/18/17 0344  NA 139  K 4.1  CL 108  CO2 25  BUN 13  CREATININE 1.21  CALCIUM 9.3  PROT 6.7  BILITOT 1.2  ALKPHOS 59  ALT 24  AST 25  GLUCOSE 121*  ALBUMIN 3.8   No results found for: MG  Recent Labs  02/17/17 1033 02/17/17 1551 02/17/17 2138  TROPONINI 0.07* 0.07* 0.06*    Recent Labs  02/16/17 1800  TROPIPOC 0.04   No results found for: PROBNP Lab Results  Component Value Date   CHOL 178 02/17/2017   HDL 32 (L) 02/17/2017   LDLCALC 125 (H) 02/17/2017   TRIG 104 02/17/2017   No results found for: DDIMER No results found for: LIPASE, AMYLASE TSH  Date/Time Value Ref Range Status  02/17/2017 10:33 AM 2.789 0.350 - 4.500 uIU/mL Final    Comment:    Performed by a 3rd Generation assay with a functional sensitivity of <=0.01 uIU/mL.   Vitamin B-12  Date/Time Value Ref Range Status  02/18/2017 03:44 AM 449 180 - 914 pg/mL Final    Comment:    (NOTE) This assay is not validated for testing neonatal or myeloproliferative syndrome specimens for Vitamin B12 levels.     Echo: 02/17/17 Study Conclusions  - Left ventricle: The cavity size was normal. Wall thickness was   increased increased in a pattern of mild to moderate LVH.   Systolic function was normal. The estimated ejection fraction was   in the range of 55% to 60%. Wall motion was normal; there were no   regional wall motion abnormalities. - Aortic valve: Trileaflet; normal thickness, mildly calcified   leaflets.  Impressions:  - No cardiac source of emboli was indentified.  Cath 2006 ANGIOGRAPHIC DATA:  Left ventricle.  Left ventricular  systolic function was  super normal with ejection fraction of 70%.  There was no significant mitral  regurgitation.   Right coronary artery.  Right coronary artery was a large caliber vessel and  a dominant vessel.  It has gotten mild to moderate diffuse luminal  irregularity, especially in the proximal segment.  The proximal segment has  40% narrowing, proximal to mid segment has a 40-50% narrowing at the origin  of the conus and RV branches.  The distal RCA has mild luminal irregularity  and gives rise into a  large PDA and a large PLA.   Left main.  Left main is a large caliber vessel.  It is normal.   Left anterior descending artery.  Left anterior  descending artery is a large  caliber vessel.  It has got mild luminal irregularity.  It gives rise into a  moderate to large sized diagonal-1 and several small diagonals.  It ends at  the apex.   Ramus intermedius.  The ramus intermedius is a very large caliber vessel and  is normal.   Circumflex coronary artery.  Circumflex coronary artery is a small to  moderate caliber vessel.  There is a mid 50-60% stenosis.  The mid segment  has moderate luminal irregularity.   Abdominal aortogram.  Abdominal aortogram revealed the presence of 2 renal  arteries, one on either side, that were not well visualized because of  minimal amount of contrast used.  Selective renal angiography revealed  widely patent renal arteries, one on either side.   IMPRESSION:  1.  No significant coronary artery disease by cardiac catheterization.      Intermediate lesions in the circumflex and also in the right coronary      artery.  The mid circumflex has 50% stenosis, but is a small caliber      vessel 50 to at most 60%, but is a small caliber vessel.  The right      coronary artery in the proximal to mid segment has moderate diffuse      luminal irregularity and has a diffuse 50, at most 60%, stenosis.  There      is also mild luminal irregularity in  its distal right coronary artery.  2.  Dynamic left ventricle with ejection fraction of 70%.  No significant      mitral regurgitation.  3.  Widely patent renal arteries.   RECOMMENDATION:  Continue primary prevention, as indicated, with aggressive  control of his lipids with an LDL goal of less than or equal to 70 and also  the HDL goal of equal to or greater than 40-45 is indicated.  He will be  observed over night and his myocardial infarction will be ruled out.  He  will need continued risk factor modification with aggressive control of his  hypertension, his weight, exercise profile.   Radiology:  Dg Chest 2 View  Result Date: 02/17/2017 CLINICAL DATA:  Recent stroke.  COPD. EXAM: CHEST  2 VIEW COMPARISON:  03/09/2016 FINDINGS: Cardiac silhouette is normal in size. Thoracic tortuous crash that thoracic aortic tortuosity is unchanged. The lungs are hyperinflated without evidence of airspace consolidation, edema, pleural effusion, or pneumothorax. No acute osseous abnormality is identified. IMPRESSION: No active cardiopulmonary disease. Electronically Signed   By: Logan Bores M.D.   On: 02/17/2017 14:19   Ct Head Wo Contrast  Result Date: 02/16/2017 CLINICAL DATA:  Right facial droop.  Difficulty speaking. EXAM: CT HEAD WITHOUT CONTRAST TECHNIQUE: Contiguous axial images were obtained from the base of the skull through the vertex without intravenous contrast. COMPARISON:  06/22/2005 head CT. FINDINGS: Brain: No evidence of parenchymal hemorrhage or extra-axial fluid collection. No mass lesion, mass effect, or midline shift. No CT evidence of acute infarction. Nonspecific mild subcortical and periventricular white matter hypodensity, most in keeping with chronic small vessel ischemic change. Cerebral volume is age appropriate. No ventriculomegaly. Vascular: No hyperdense vessel or unexpected calcification. Intracranial atherosclerosis. Skull: No evidence of calvarial fracture. Sinuses/Orbits:  Mild mucoperiosteal thickening in the bilateral ethmoidal air cells and left maxillary sinus. No fluid levels in the visualized paranasal sinuses. Other:  The mastoid air cells are unopacified. IMPRESSION: 1.  No evidence of acute intracranial abnormality. 2. Mild chronic small vessel ischemia. 3.  Mild paranasal sinusitis, probably chronic. Electronically Signed   By: Ilona Sorrel M.D.   On: 02/16/2017 18:49   Mr Angiogram Head Wo Contrast  Result Date: 02/16/2017 CLINICAL DATA:  Facial droop, unsteady gait and difficulty speaking. Hypertension. EXAM: MR HEAD WITHOUT CONTRAST MR CIRCLE OF WILLIS WITHOUT CONTRAST MRA OF THE NECK WITHOUT AND WITH CONTRAST TECHNIQUE: Multiplanar, multiecho pulse sequences of the brain, circle of willis and surrounding structures were obtained without intravenous contrast. Angiographic images of the neck were obtained using MRA technique without and with intravenous contrast. CONTRAST:  33mL MULTIHANCE GADOBENATE DIMEGLUMINE 529 MG/ML IV SOLN COMPARISON:  Head CT 02/16/2017 FINDINGS: MR HEAD FINDINGS Brain: There is a small area of diffusion restriction within the left corona radiata extending superiorly to the precentral gyrus. There is multifocal hyperintense T2-weighted signal within the periventricular white matter, most often seen in the setting of chronic microvascular ischemia. No mass lesion or midline shift. No hydrocephalus or extra-axial fluid collection. The midline structures are normal. No age advanced or lobar predominant atrophy. Vascular: Major intracranial arterial and venous sinus flow voids are preserved. No evidence of chronic microhemorrhage or amyloid angiopathy. Skull and upper cervical spine: The visualized skull base, calvarium, upper cervical spine and extracranial soft tissues are normal. Sinuses/Orbits: No fluid levels or advanced mucosal thickening. No mastoid effusion. Normal orbits. MR CIRCLE OF WILLIS FINDINGS Intracranial internal carotid arteries:  Normal. Anterior cerebral arteries: Normal. Middle cerebral arteries: There is mild multifocal narrowing of both middle cerebral arteries. However, this is favored to be an artifact caused by patient motion. Otherwise, the MCAs are normal. Posterior communicating arteries: Present on the left. Posterior cerebral arteries: Normal. Basilar artery: Normal. Vertebral arteries: Left dominant. The right vertebral artery terminates in the right posterior-inferior cerebellar artery. Superior cerebellar arteries: Normal. Anterior inferior cerebellar arteries: Normal. Posterior inferior cerebellar arteries: Normal. MRA NECK FINDINGS The visualized aortic arch is normal. Proximal subclavian arteries are normal. Poor visualization of the vertebral artery origins. The vertebral system is left dominant without focal stenosis along either cervical segment. There is multifocal narrowing of the diminutive right vertebral artery V4 segment. There is no common or internal carotid artery stenosis. IMPRESSION: 1. Acute ischemia within the left corona radiata extending to the left precentral gyrus. This is in keeping with the reported right-sided facial symptoms. 2. No acute hemorrhage or mass effect. 3. No intracranial or cervical arterial occlusion or high-grade stenosis. 4. Apparent mild atherosclerotic narrowing of both middle cerebral arteries is favored to be an artifact due to motion. 5. Multifocal narrowing of the non-dominant right vertebral artery V4 segment, prior to its termination in the right PICA. These results will be called to the ordering clinician or representative by the Radiologist Assistant, and communication documented in the PACS or zVision Dashboard. Electronically Signed   By: Ulyses Jarred M.D.   On: 02/16/2017 23:38   Mr Angiogram Neck W Or Wo Contrast  Result Date: 02/16/2017 CLINICAL DATA:  Facial droop, unsteady gait and difficulty speaking. Hypertension. EXAM: MR HEAD WITHOUT CONTRAST MR CIRCLE OF  WILLIS WITHOUT CONTRAST MRA OF THE NECK WITHOUT AND WITH CONTRAST TECHNIQUE: Multiplanar, multiecho pulse sequences of the brain, circle of willis and surrounding structures were obtained without intravenous contrast. Angiographic images of the neck were obtained using MRA technique without and with intravenous contrast. CONTRAST:  70mL MULTIHANCE GADOBENATE DIMEGLUMINE 529 MG/ML IV SOLN COMPARISON:  Head CT 02/16/2017 FINDINGS: MR HEAD FINDINGS Brain: There is a small area of diffusion restriction within the left corona  radiata extending superiorly to the precentral gyrus. There is multifocal hyperintense T2-weighted signal within the periventricular white matter, most often seen in the setting of chronic microvascular ischemia. No mass lesion or midline shift. No hydrocephalus or extra-axial fluid collection. The midline structures are normal. No age advanced or lobar predominant atrophy. Vascular: Major intracranial arterial and venous sinus flow voids are preserved. No evidence of chronic microhemorrhage or amyloid angiopathy. Skull and upper cervical spine: The visualized skull base, calvarium, upper cervical spine and extracranial soft tissues are normal. Sinuses/Orbits: No fluid levels or advanced mucosal thickening. No mastoid effusion. Normal orbits. MR CIRCLE OF WILLIS FINDINGS Intracranial internal carotid arteries: Normal. Anterior cerebral arteries: Normal. Middle cerebral arteries: There is mild multifocal narrowing of both middle cerebral arteries. However, this is favored to be an artifact caused by patient motion. Otherwise, the MCAs are normal. Posterior communicating arteries: Present on the left. Posterior cerebral arteries: Normal. Basilar artery: Normal. Vertebral arteries: Left dominant. The right vertebral artery terminates in the right posterior-inferior cerebellar artery. Superior cerebellar arteries: Normal. Anterior inferior cerebellar arteries: Normal. Posterior inferior cerebellar  arteries: Normal. MRA NECK FINDINGS The visualized aortic arch is normal. Proximal subclavian arteries are normal. Poor visualization of the vertebral artery origins. The vertebral system is left dominant without focal stenosis along either cervical segment. There is multifocal narrowing of the diminutive right vertebral artery V4 segment. There is no common or internal carotid artery stenosis. IMPRESSION: 1. Acute ischemia within the left corona radiata extending to the left precentral gyrus. This is in keeping with the reported right-sided facial symptoms. 2. No acute hemorrhage or mass effect. 3. No intracranial or cervical arterial occlusion or high-grade stenosis. 4. Apparent mild atherosclerotic narrowing of both middle cerebral arteries is favored to be an artifact due to motion. 5. Multifocal narrowing of the non-dominant right vertebral artery V4 segment, prior to its termination in the right PICA. These results will be called to the ordering clinician or representative by the Radiologist Assistant, and communication documented in the PACS or zVision Dashboard. Electronically Signed   By: Ulyses Jarred M.D.   On: 02/16/2017 23:38   Mr Brain Wo Contrast  Result Date: 02/16/2017 CLINICAL DATA:  Facial droop, unsteady gait and difficulty speaking. Hypertension. EXAM: MR HEAD WITHOUT CONTRAST MR CIRCLE OF WILLIS WITHOUT CONTRAST MRA OF THE NECK WITHOUT AND WITH CONTRAST TECHNIQUE: Multiplanar, multiecho pulse sequences of the brain, circle of willis and surrounding structures were obtained without intravenous contrast. Angiographic images of the neck were obtained using MRA technique without and with intravenous contrast. CONTRAST:  51mL MULTIHANCE GADOBENATE DIMEGLUMINE 529 MG/ML IV SOLN COMPARISON:  Head CT 02/16/2017 FINDINGS: MR HEAD FINDINGS Brain: There is a small area of diffusion restriction within the left corona radiata extending superiorly to the precentral gyrus. There is multifocal hyperintense  T2-weighted signal within the periventricular white matter, most often seen in the setting of chronic microvascular ischemia. No mass lesion or midline shift. No hydrocephalus or extra-axial fluid collection. The midline structures are normal. No age advanced or lobar predominant atrophy. Vascular: Major intracranial arterial and venous sinus flow voids are preserved. No evidence of chronic microhemorrhage or amyloid angiopathy. Skull and upper cervical spine: The visualized skull base, calvarium, upper cervical spine and extracranial soft tissues are normal. Sinuses/Orbits: No fluid levels or advanced mucosal thickening. No mastoid effusion. Normal orbits. MR CIRCLE OF WILLIS FINDINGS Intracranial internal carotid arteries: Normal. Anterior cerebral arteries: Normal. Middle cerebral arteries: There is mild multifocal narrowing of both middle cerebral  arteries. However, this is favored to be an artifact caused by patient motion. Otherwise, the MCAs are normal. Posterior communicating arteries: Present on the left. Posterior cerebral arteries: Normal. Basilar artery: Normal. Vertebral arteries: Left dominant. The right vertebral artery terminates in the right posterior-inferior cerebellar artery. Superior cerebellar arteries: Normal. Anterior inferior cerebellar arteries: Normal. Posterior inferior cerebellar arteries: Normal. MRA NECK FINDINGS The visualized aortic arch is normal. Proximal subclavian arteries are normal. Poor visualization of the vertebral artery origins. The vertebral system is left dominant without focal stenosis along either cervical segment. There is multifocal narrowing of the diminutive right vertebral artery V4 segment. There is no common or internal carotid artery stenosis. IMPRESSION: 1. Acute ischemia within the left corona radiata extending to the left precentral gyrus. This is in keeping with the reported right-sided facial symptoms. 2. No acute hemorrhage or mass effect. 3. No  intracranial or cervical arterial occlusion or high-grade stenosis. 4. Apparent mild atherosclerotic narrowing of both middle cerebral arteries is favored to be an artifact due to motion. 5. Multifocal narrowing of the non-dominant right vertebral artery V4 segment, prior to its termination in the right PICA. These results will be called to the ordering clinician or representative by the Radiologist Assistant, and communication documented in the PACS or zVision Dashboard. Electronically Signed   By: Ulyses Jarred M.D.   On: 02/16/2017 23:38    ASSESSMENT AND PLAN:    1. Elevated troponin - Flat trend. EKG with new changes but LVH pattern. No chest pain. Likely demand in setting of acute stroke.  Cath 2006 showed 50-60% stenosis in the proximal and mid RCA and mid circumflex coronary artery. - Continue ASA and statin. Will do stress test. Likely outpatient.   2. HTN - BP stable on losartan here. At home takes Exforge 10/320mg .    3. Stoke - Per neurology. Loop recorder - timing pending.   After careful review of history and examination, the risks and benefits of transesophageal echocardiogram have been explained including risks of esophageal damage, perforation (1:10,000 risk), bleeding, pharyngeal hematoma as well as other potential complications associated with conscious sedation including aspiration, arrhythmia, respiratory failure and death. Alternatives to treatment were discussed, questions were answered. Patient is willing to proceed.   TEE - tomorrow @ 8am  4. HLD - 02/17/2017: Cholesterol 178; HDL 32; LDL Cholesterol 125; Triglycerides 104; VLDL 21  - LDL goal less than 70. Continue stain.   5. PACs - Recently more noticeable palpitations. No dizziness or syncope.  As above.   SignedLeanor Kail, PA 02/18/2017, 1:40 PM Pager [phone]  Co-Sign MD

## 2017-02-18 NOTE — Progress Notes (Signed)
STROKE TEAM PROGRESS NOTE   SUBJECTIVE (INTERVAL HISTORY) No family s at the bedside.  He is up in the chair at the bedside. He described events first occurred when he was moving his office, picking up and moving heavy boxes. Concerned for embolic source of stroke. He denies prior history of cardiac arrhythmias but has noted some ectopic beats from time to time OBJECTIVE Temp:  [97.9 F (36.6 C)-98.1 F (36.7 C)] 98.1 F (36.7 C) (03/05 0943) Pulse Rate:  [61-79] 72 (03/05 0943) Cardiac Rhythm: Heart block (03/05 0800) Resp:  [18-20] 19 (03/05 0943) BP: (138-156)/(84-94) 156/85 (03/05 0943) SpO2:  [95 %-100 %] 95 % (03/05 0943)  CBC:   Recent Labs Lab 02/16/17 1743 02/16/17 1802 02/18/17 0344  WBC 9.4  --  8.4  NEUTROABS 5.3  --   --   HGB 14.3 15.0 13.8  HCT 42.2 44.0 40.6  MCV 85.9  --  86.4  PLT 257  --  [id number]    Basic Metabolic Panel:   Recent Labs Lab 02/16/17 1743 02/16/17 1802 02/18/17 0344  NA 139 141 139  K 3.7 3.7 4.1  CL 104 104 108  CO2 25  --  25  GLUCOSE 96 93 121*  BUN 11 13 13   CREATININE 1.20 1.20 1.21  CALCIUM 9.7  --  9.3   Urine Drug Screen: No results found for: LABOPIA, COCAINSCRNUR, LABBENZ, AMPHETMU, THCU, LABBARB      PHYSICAL EXAM Pleasant middle-aged African-American male currently not in distress. . Afebrile. Head is nontraumatic. Neck is supple without bruit.    Cardiac exam no murmur or gallop. Lungs are clear to auscultation. Distal pulses are well felt. Neurological Exam ;  Awake  Alert oriented x 3. Normal speech and language.able to name, repeat and comprehend well without any deficits. eye movements full without nystagmus.fundi were not visualized. Vision acuity and fields appear normal. Hearing is normal. Palatal movements are normal. Face symmetric. Tongue midline. Normal strength, tone, reflexes and coordination. Normal sensation. Gait deferred.  ASSESSMENT/PLAN Mr. John Finley is a 66 y.o. male with history of HTN,  pre-diabetes, cardiac arrhythmia since teen and OSA presenting with R facial droop, dysarthria and halting speech. He did not receive IV t-PA due to late arrival.   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 No source of embolus   LE venous dopplers to rule out VTE  TEE to look for embolic source. Arranged with [place] for tomorrow.   (I have made patient NPO after midnight tonight).   If TEE negative, a [facility] electrophysiologist will consult and consider placement of an implantable loop recorder to evaluate for atrial fibrillation as etiology of stroke. This has been explained to patient/family by Dr. Leonie Man and they are agreeable.   LDL 125  HgbA1c 6.8  Pt ambulatory. No VTE prophylaxis ordered Diet Heart Room service appropriate? Yes; Fluid consistency: Thin  No antithrombotic prior to admission, took aspirin 81 mg prior to arrival to ED, now on aspirin 325 mg daily  Therapy recommendations:  No PT, no OT  Disposition:  Return home (currently works 3 days a week, consider retirement after this hospitalization)  Hypertension  Stable  Permissive hypertension (OK if < 220/120) but gradually normalize in 5-7  days  Long-term BP goal normotensive  Hyperlipidemia  Home meds:  No statin  LDL above goal  Now on lipitor 80 mg daily  Continue statin at discharge  Pre-Diabetes  HgbA1c at goal < 7.0  Controlled  Other Stroke Risk Factors  Obesity, Body mass index is 34.97 kg/m.  Former smoker  Coronary artery disease  Obstructive sleep apnea, on CPAP at home  UDS not checked  Other Active Problems  Bradycardia  ? atrial fibrillation seen in ED per pt - cardiology will review strips  Hospital day # [place] for Pager information 02/18/2017 2:01 PM  I have personally examined this patient, reviewed notes, independently viewed imaging studies, participated in medical decision making and plan of care.ROS completed by me personally and pertinent positives fully documented  I have made any additions or clarifications directly to the above note. Agree with note above. He presented with transient expressive language difficulties and mild right-sided weakness after lifting heavy boxes while moving office. MRI patent is suggestive of a large subcortical infarct and embolic stroke is possible. Recommend continue cardiac monitoring for paroxysmal A. fib and check TEE and consider loop recorder if etiology is not discovered. Start aspirin for stroke prevention. Greater than 50% time during this 35 minute visit was spent on counseling and coordination of care about his stroke, discussion of evaluation and management plan and answering questions  Antony Contras, MD Medical Director [place] Pager: [phone] 02/18/2017 5:33 PM  To contact Stroke Continuity provider, please refer to [email or web address] After hours, contact General Neurology

## 2017-02-19 ENCOUNTER — Other Ambulatory Visit: Payer: Self-pay | Admitting: Physician Assistant

## 2017-02-19 ENCOUNTER — Inpatient Hospital Stay (HOSPITAL_COMMUNITY): Payer: 59

## 2017-02-19 ENCOUNTER — Inpatient Hospital Stay (HOSPITAL_COMMUNITY): Payer: 59 | Admitting: Anesthesiology

## 2017-02-19 ENCOUNTER — Encounter (HOSPITAL_COMMUNITY): Payer: Self-pay | Admitting: *Deleted

## 2017-02-19 ENCOUNTER — Encounter (HOSPITAL_COMMUNITY)
Admission: EM | Disposition: A | Discharge status: Home / Self Care | Payer: Self-pay | Source: Home / Self Care | Attending: Internal Medicine

## 2017-02-19 DIAGNOSIS — R748 Abnormal levels of other serum enzymes: Secondary | ICD-10-CM

## 2017-02-19 DIAGNOSIS — I639 Cerebral infarction, unspecified: Secondary | ICD-10-CM

## 2017-02-19 DIAGNOSIS — I340 Nonrheumatic mitral (valve) insufficiency: Secondary | ICD-10-CM

## 2017-02-19 HISTORY — PX: TEE WITHOUT CARDIOVERSION: SHX5443

## 2017-02-19 SURGERY — ECHOCARDIOGRAM, TRANSESOPHAGEAL
Anesthesia: Monitor Anesthesia Care

## 2017-02-19 MED ORDER — ONDANSETRON HCL 4 MG/2ML IJ SOLN: 4.0000 mg | Freq: Four times a day (QID) | INTRAMUSCULAR | Status: DC | PRN

## 2017-02-19 MED ORDER — ATORVASTATIN CALCIUM 40 MG PO TABS: 40.0000 mg | ORAL_TABLET | Freq: Every day | ORAL | 3 refills | Status: DC

## 2017-02-19 MED ORDER — OXYCODONE HCL 5 MG/5ML PO SOLN: 5.0000 mg | Freq: Once | ORAL | Status: DC | PRN

## 2017-02-19 MED ORDER — PROPOFOL 10 MG/ML IV BOLUS
INTRAVENOUS | Status: DC | PRN
  Administered 2017-02-19 (×2): 30 mg via INTRAVENOUS
  Administered 2017-02-19: 50 mg via INTRAVENOUS
  Administered 2017-02-19: 100 mg via INTRAVENOUS
  Administered 2017-02-19: 30 mg via INTRAVENOUS
  Administered 2017-02-19: 60 mg via INTRAVENOUS
  Administered 2017-02-19: 30 mg via INTRAVENOUS

## 2017-02-19 MED ORDER — SODIUM CHLORIDE 0.9 % IV SOLN
INTRAVENOUS | Status: DC
  Administered 2017-02-19 (×2): via INTRAVENOUS

## 2017-02-19 MED ORDER — FENTANYL CITRATE (PF) 100 MCG/2ML IJ SOLN: 25.0000 ug | INTRAMUSCULAR | Status: DC | PRN

## 2017-02-19 MED ORDER — ATORVASTATIN CALCIUM 40 MG PO TABS: 40.0000 mg | ORAL_TABLET | Freq: Every day | ORAL | Status: DC

## 2017-02-19 MED ORDER — CLOPIDOGREL BISULFATE 75 MG PO TABS: 75.0000 mg | ORAL_TABLET | Freq: Every day | ORAL | Status: DC

## 2017-02-19 MED ORDER — OXYCODONE HCL 5 MG PO TABS: 5.0000 mg | ORAL_TABLET | Freq: Once | ORAL | Status: DC | PRN

## 2017-02-19 MED ORDER — CLOPIDOGREL BISULFATE 75 MG PO TABS: 75.0000 mg | ORAL_TABLET | Freq: Every day | ORAL | 3 refills | Status: AC

## 2017-02-19 MED ORDER — LOSARTAN POTASSIUM 100 MG PO TABS: 100.0000 mg | ORAL_TABLET | Freq: Every day | ORAL | 3 refills | Status: DC

## 2017-02-19 NOTE — Interval H&P Note (Signed)
History and Physical Interval Note:  02/19/2017 8:13 AM  John Finley  has presented today for surgery, with the diagnosis of STROKE  The various methods of treatment have been discussed with the patient and family. After consideration of risks, benefits and other options for treatment, the patient has consented to  Procedure(s): TRANSESOPHAGEAL ECHOCARDIOGRAM (TEE) (N/A) as a surgical intervention .  The patient's history has been reviewed, patient examined, no change in status, stable for surgery.  I have reviewed the patient's chart and labs.  Questions were answered to the patient's satisfaction.     Ena Dawley

## 2017-02-19 NOTE — Consult Note (Addendum)
ELECTROPHYSIOLOGY CONSULT NOTE  Patient ID: John Finley MRN: [id number], DOB/AGE: 65-Nov-1952   Admit date: 02/16/2017 Date of Consult: 02/19/2017  Primary Physician: Foye Spurling, MD Primary Cardiologist: None Reason for Consultation: Cryptogenic stroke ; recommendations regarding Implantable Loop Recorder  History of Present Illness John Finley was admitted on 02/16/2017 with acute CVA.  PMHx includes HTN, HLD, OSA, NOD CAD by old notes.  There is mention of an unclear hx of arrhythmia, the patient though denies this, states that in the last year his Apple watch would note an irregular pulse and he would notice when checking his pulse, but denies any feeling of palpitations otherwise and has never been found with an arrhythmia.   He first developed symptoms while lifting, moving boxes.  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  he has undergone workup for stroke including echocardiogram and carotid angio.  The patient has been monitored on telemetry which has demonstrated sinus rhythm with frequent PAC's, occ PAT's, no arrhythmias.  Inpatient stroke work-up is to be completed with a TEE this morning.   Echocardiogram this admission demonstrated Study Conclusions - Left ventricle: The cavity size was normal. Wall thickness was   increased increased in a pattern of mild to moderate LVH.   Systolic function was normal. The estimated ejection fraction was   in the range of 55% to 60%. Wall motion was normal; there were no   regional wall motion abnormalities. - Aortic valve: Trileaflet; normal thickness, mildly calcified   leaflets. Impressions: - No cardiac source of emboli was indentified.   Lab work is reviewed.  Prior to admission, the patient denies chest pain, shortness of breath, dizziness, palpitations, or syncope.  They are recovering from their stroke with plans to home at discharge.   EP has  been asked to evaluate for placement of an implantable loop recorder to monitor for atrial fibrillation.     Past Medical History:  Diagnosis Date  . Hypertension   . Prediabetes   . Sleep apnea      Surgical History:  Past Surgical History:  Procedure Laterality Date  . ANKLE SURGERY       Prescriptions Prior to Admission  Medication Sig Dispense Refill Last Dose  . amLODipine-valsartan (EXFORGE) 10-320 MG tablet Take 1 tablet by mouth daily.   02/16/2017 at Unknown time  . aspirin EC 81 MG tablet Take 81 mg by mouth once.   02/16/2017 at Unknown time    Inpatient Medications:  .  stroke: mapping our early stages of recovery book   Does not apply Once  . aspirin  300 mg Rectal Daily   Or  . aspirin  325 mg Oral Daily  . atorvastatin  80 mg Oral q1800  . losartan  25 mg Oral BID    Allergies:  Allergies  Allergen Reactions  . Penicillins Itching and Rash  . Sulfa Antibiotics Hives, Itching and Rash    Social History   Social History  . Marital status: Married    Spouse name: N/A  . Number of children: N/A  . Years of education: N/A   Occupational History  . Not on file.   Social History Main Topics  . Smoking status: Never Smoker  . Smokeless tobacco: Never Used  . Alcohol use No  . Drug use: No  . Sexual activity: Not on file   Other Topics Concern  . Not on file   Social History  Narrative  . No narrative on file     Family History  Problem Relation Age of Onset  . Cancer - Other Mother   . Diabetes Mother   . Stroke Mother   . Heart disease Mother   . Hypertension Sister   . Diabetes Brother   . Stroke Brother       Review of Systems: All other systems reviewed and are otherwise negative except as noted above.  Physical Exam: Vitals:   02/19/17 0515 02/19/17 0736 02/19/17 0838 02/19/17 0848  BP: (!) 141/68 (!) 181/112 134/70 (!) 133/99  Pulse: (!) 51  84 70  Resp: 18 19 (!) 25 15  Temp: 98 F (36.7 C) 98 F (36.7 C) 97.7 F (36.5  C)   TempSrc: Oral Oral Oral   SpO2: 98% 98% 98% 98%  Weight:  230 lb (104.3 kg)    Height:  5\' 6"  (1.676 m)      GEN- The patient is well appearing, alert and oriented x 3 today.   Head- normocephalic, atraumatic Eyes-  Sclera clear, conjunctiva pink Ears- hearing intact Oropharynx- clear Neck- supple Lungs- CTA b/l , normal work of breathing Heart- RRR, no murmurs, rubs or gallops  GI- soft, NT, ND Extremities- no clubbing, cyanosis, or edema MS- no significant deformity or atrophy Skin- no rash or lesion Psych- euthymic mood, full affect   Labs:   Lab Results  Component Value Date   WBC 8.4 02/18/2017   HGB 13.8 02/18/2017   HCT 40.6 02/18/2017   MCV 86.4 02/18/2017   PLT 248 02/18/2017     Recent Labs Lab 02/18/17 0344  NA 139  K 4.1  CL 108  CO2 25  BUN 13  CREATININE 1.21  CALCIUM 9.3  PROT 6.7  BILITOT 1.2  ALKPHOS 59  ALT 24  AST 25  GLUCOSE 121*   Lab Results  Component Value Date   CKTOTAL 437 (H) 07/11/2011   CKMB 5.0 (H) 07/11/2011   TROPONINI 0.06 (HH) 02/17/2017   Lab Results  Component Value Date   CHOL 178 02/17/2017   Lab Results  Component Value Date   HDL 32 (L) 02/17/2017   Lab Results  Component Value Date   LDLCALC 125 (H) 02/17/2017   Lab Results  Component Value Date   TRIG 104 02/17/2017   Lab Results  Component Value Date   CHOLHDL 5.6 02/17/2017   No results found for: LDLDIRECT  No results found for: DDIMER   Radiology/Studies:  Dg Chest 2 View Result Date: 02/17/2017 CLINICAL DATA:  Recent stroke.  COPD. EXAM: CHEST  2 VIEW COMPARISON:  03/09/2016 FINDINGS: Cardiac silhouette is normal in size. Thoracic tortuous crash that thoracic aortic tortuosity is unchanged. The lungs are hyperinflated without evidence of airspace consolidation, edema, pleural effusion, or pneumothorax. No acute osseous abnormality is identified. IMPRESSION: No active cardiopulmonary disease. Electronically Signed   By: Logan Bores  M.D.   On: 02/17/2017 14:19   Ct Head Wo Contrast Result Date: 02/16/2017 CLINICAL DATA:  Right facial droop.  Difficulty speaking. EXAM: CT HEAD WITHOUT CONTRAST TECHNIQUE: Contiguous axial images were obtained from the base of the skull through the vertex without intravenous contrast. COMPARISON:  06/22/2005 head CT. FINDINGS: Brain: No evidence of parenchymal hemorrhage or extra-axial fluid collection. No mass lesion, mass effect, or midline shift. No CT evidence of acute infarction. Nonspecific mild subcortical and periventricular white matter hypodensity, most in keeping with chronic small vessel ischemic change. Cerebral volume is age appropriate.  No ventriculomegaly. Vascular: No hyperdense vessel or unexpected calcification. Intracranial atherosclerosis. Skull: No evidence of calvarial fracture. Sinuses/Orbits: Mild mucoperiosteal thickening in the bilateral ethmoidal air cells and left maxillary sinus. No fluid levels in the visualized paranasal sinuses. Other:  The mastoid air cells are unopacified. IMPRESSION: 1.  No evidence of acute intracranial abnormality. 2. Mild chronic small vessel ischemia. 3. Mild paranasal sinusitis, probably chronic. Electronically Signed   By: Ilona Sorrel M.D.   On: 02/16/2017 18:49   Mr Angiogram Head Wo Contrast Result Date: 02/16/2017 CLINICAL DATA:  Facial droop, unsteady gait and difficulty speaking. Hypertension. EXAM: MR HEAD WITHOUT CONTRAST MR CIRCLE OF WILLIS WITHOUT CONTRAST MRA OF THE NECK WITHOUT AND WITH CONTRAST TECHNIQUE: Multiplanar, multiecho pulse sequences of the brain, circle of willis and surrounding structures were obtained without intravenous contrast. Angiographic images of the neck were obtained using MRA technique without and with intravenous contrast. CONTRAST:  99mL MULTIHANCE GADOBENATE DIMEGLUMINE 529 MG/ML IV SOLN COMPARISON:  Head CT 02/16/2017 FINDINGS: MR HEAD FINDINGS Brain: There is a small area of diffusion restriction within the left  corona radiata extending superiorly to the precentral gyrus. There is multifocal hyperintense T2-weighted signal within the periventricular white matter, most often seen in the setting of chronic microvascular ischemia. No mass lesion or midline shift. No hydrocephalus or extra-axial fluid collection. The midline structures are normal. No age advanced or lobar predominant atrophy. Vascular: Major intracranial arterial and venous sinus flow voids are preserved. No evidence of chronic microhemorrhage or amyloid angiopathy. Skull and upper cervical spine: The visualized skull base, calvarium, upper cervical spine and extracranial soft tissues are normal. Sinuses/Orbits: No fluid levels or advanced mucosal thickening. No mastoid effusion. Normal orbits. MR CIRCLE OF WILLIS FINDINGS Intracranial internal carotid arteries: Normal. Anterior cerebral arteries: Normal. Middle cerebral arteries: There is mild multifocal narrowing of both middle cerebral arteries. However, this is favored to be an artifact caused by patient motion. Otherwise, the MCAs are normal. Posterior communicating arteries: Present on the left. Posterior cerebral arteries: Normal. Basilar artery: Normal. Vertebral arteries: Left dominant. The right vertebral artery terminates in the right posterior-inferior cerebellar artery. Superior cerebellar arteries: Normal. Anterior inferior cerebellar arteries: Normal. Posterior inferior cerebellar arteries: Normal. MRA NECK FINDINGS The visualized aortic arch is normal. Proximal subclavian arteries are normal. Poor visualization of the vertebral artery origins. The vertebral system is left dominant without focal stenosis along either cervical segment. There is multifocal narrowing of the diminutive right vertebral artery V4 segment. There is no common or internal carotid artery stenosis. IMPRESSION: 1. Acute ischemia within the left corona radiata extending to the left precentral gyrus. This is in keeping with  the reported right-sided facial symptoms. 2. No acute hemorrhage or mass effect. 3. No intracranial or cervical arterial occlusion or high-grade stenosis. 4. Apparent mild atherosclerotic narrowing of both middle cerebral arteries is favored to be an artifact due to motion. 5. Multifocal narrowing of the non-dominant right vertebral artery V4 segment, prior to its termination in the right PICA. These results will be called to the ordering clinician or representative by the Radiologist Assistant, and communication documented in the PACS or zVision Dashboard. Electronically Signed   By: Ulyses Jarred M.D.   On: 02/16/2017 23:38      12-lead ECG SR All prior EKG's in EPIC reviewed with no documented atrial fibrillation  Telemetry SR, at times frequent PAC's, brief PAT's  Assessment and Plan:  1. Cryptogenic stroke The patient presents with cryptogenic stroke.  The patient has a  TEE planned for this AM.  I spoke at length with the patient about monitoring for afib with either a 30 day event monitor or an implantable loop recorder, he is leaning towards a 30 day monitor though would like to discuss further with Dr. Rayann Heman.  Risks, benefits, and alteratives to implantable loop recorder were discussed with the patient today.   Will await further discussion and decision.  AMENDED to include family history only  John Finley, [place] 02/19/2017   I have seen, examined the patient, and reviewed the above assessment and plan. On exam, RRR.  Changes to above are made where necessary.  On tele, he has pacs and nonsustained atach.  LA enlarged.  He is at risk for afib.  We discussed 30 monitor and ILR placement.  He is clear at this time that he would prefer 30 day monitor placement at this time.  He will follow-up with me in 6 weeks to discuss the results.  He may be more interested in ILR if his 30 day monitor is unrevealing.  Electrophysiology team to see as needed while here. Please call with  questions.   Thompson Grayer MD 02/19/2017 9:52 AM

## 2017-02-19 NOTE — CV Procedure (Signed)
     Transesophageal Echocardiogram Note  John Finley [id number] Apr 27, 1951  Procedure: Transesophageal Echocardiogram Indications: CVA  Procedure Details Consent: Obtained Time Out: Verified patient identification, verified procedure, site/side was marked, verified correct patient position, special equipment/implants available, Radiology Safety Procedures followed,  medications/allergies/relevent history reviewed, required imaging and test results available.  Performed  Medications: Iv Propofol and Lidocaine administered by anesthesia staff.   Left Ventrical:  LVEF 65-70-%, moderate concentric LVH.  Mitral Valve: Mild MR.  Aortic Valve: Normal, No AI.   Tricuspid Valve: Trivial TR.   Pulmonic Valve: Normal.  Left Atrium/ Left atrial appendage: Normal, no thrombus in the left atrium or left atrial appendage.   Atrial septum: No ASD or PFO, negative bubble study.   Aorta: Moderate non-mobile focal plaque in the descending thoracic aorta.   Complications: No apparent complications Patient did tolerate procedure well.  Ena Dawley, MD, [facility] 02/19/2017, 8:13 AM

## 2017-02-19 NOTE — Anesthesia Preprocedure Evaluation (Addendum)
Anesthesia Evaluation  Patient identified by MRN, date of birth, ID band Patient awake    Reviewed: Allergy & Precautions, H&P , NPO status , Patient's Chart, lab work & pertinent test results  Airway Mallampati: II  TM Distance: >3 FB Neck ROM: full    Dental  (+) Teeth Intact, Dental Advidsory Given   Pulmonary sleep apnea and Continuous Positive Airway Pressure Ventilation ,    breath sounds clear to auscultation       Cardiovascular hypertension,  Rhythm:regular Rate:Normal     Neuro/Psych CVA, Residual Symptoms    GI/Hepatic   Endo/Other    Renal/GU      Musculoskeletal   Abdominal   Peds  Hematology   Anesthesia Other Findings Left ventricle: The cavity size was normal. Wall thickness was   increased increased in a pattern of mild to moderate LVH.   Systolic function was normal. The estimated ejection fraction was   in the range of 55% to 60%. Wall motion was normal; there were no   regional wall motion abnormalities. - Aortic valve: Trileaflet; normal thickness, mildly calcified   leaflets.  Reproductive/Obstetrics                          Anesthesia Physical Anesthesia Plan  ASA: II  Anesthesia Plan: MAC   Post-op Pain Management:    Induction: Intravenous  Airway Management Planned: Nasal Cannula  Additional Equipment:   Intra-op Plan:   Post-operative Plan:   Informed Consent: I have reviewed the patients History and Physical, chart, labs and discussed the procedure including the risks, benefits and alternatives for the proposed anesthesia with the patient or authorized representative who has indicated his/her understanding and acceptance.   Dental Advisory Given  Plan Discussed with: Anesthesiologist, CRNA and Surgeon  Anesthesia Plan Comments:        Anesthesia Quick Evaluation

## 2017-02-19 NOTE — Progress Notes (Signed)
VASCULAR LAB PRELIMINARY  PRELIMINARY  PRELIMINARY  PRELIMINARY  Bilateral lower extremity venous duplex completed.    Preliminary report:  Bilateral:  No evidence of DVT, superficial thrombosis, or Baker's Cyst.   Mckenze Slone, RVS 02/19/2017, 2:43 PM

## 2017-02-19 NOTE — Anesthesia Postprocedure Evaluation (Signed)
Anesthesia Post Note  Patient: John Finley  Procedure(s) Performed: Procedure(s) (LRB): TRANSESOPHAGEAL ECHOCARDIOGRAM (TEE) (N/A)  Patient location during evaluation: PACU Anesthesia Type: MAC Level of consciousness: awake and alert Pain management: pain level controlled Vital Signs Assessment: post-procedure vital signs reviewed and stable Respiratory status: spontaneous breathing, nonlabored ventilation, respiratory function stable and patient connected to nasal cannula oxygen Cardiovascular status: stable and blood pressure returned to baseline Anesthetic complications: no       Last Vitals:  Vitals:   02/19/17 0838 02/19/17 0848  BP: 134/70 (!) 133/99  Pulse: 84 70  Resp: (!) 25 15  Temp: 36.5 C     Last Pain:  Vitals:   02/19/17 0838  TempSrc: Oral  PainSc:                  [place] S

## 2017-02-19 NOTE — Progress Notes (Signed)
STROKE TEAM PROGRESS NOTE   SUBJECTIVE (INTERVAL HISTORY) No family s at the bedside.  He is up in the chair at the bedside.  He feels his speech is a lot improved and is almost back to baseline. He prefers Plavix for stroke prevention or aspirin. Prevacid 30 day heart monitor or loop recorder  OBJECTIVE Temp:  [97.7 F (36.5 C)-98.4 F (36.9 C)] 98.1 F (36.7 C) (03/06 0959) Pulse Rate:  [51-84] 58 (03/06 0959) Cardiac Rhythm: Heart block (03/05 1900) Resp:  [15-25] 18 (03/06 0959) BP: (133-181)/(68-112) 148/88 (03/06 0959) SpO2:  [97 %-98 %] 98 % (03/06 0959) Weight:  [230 lb (104.3 kg)] 230 lb (104.3 kg) (03/06 0736)  CBC:   Recent Labs Lab 02/16/17 1743 02/16/17 1802 02/18/17 0344  WBC 9.4  --  8.4  NEUTROABS 5.3  --   --   HGB 14.3 15.0 13.8  HCT 42.2 44.0 40.6  MCV 85.9  --  86.4  PLT 257  --  [id number]    Basic Metabolic Panel:   Recent Labs Lab 02/16/17 1743 02/16/17 1802 02/18/17 0344  NA 139 141 139  K 3.7 3.7 4.1  CL 104 104 108  CO2 25  --  25  GLUCOSE 96 93 121*  BUN 11 13 13   CREATININE 1.20 1.20 1.21  CALCIUM 9.7  --  9.3   Urine Drug Screen: No results found for: LABOPIA, COCAINSCRNUR, LABBENZ, AMPHETMU, THCU, LABBARB      PHYSICAL EXAM Pleasant middle-aged African-American male currently not in distress. . Afebrile. Head is nontraumatic. Neck is supple without bruit.    Cardiac exam no murmur or gallop. Lungs are clear to auscultation. Distal pulses are well felt. Neurological Exam ;  Awake  Alert oriented x 3. Normal speech and language.able to name, repeat and comprehend well without any deficits. eye movements full without nystagmus.fundi were not visualized. Vision acuity and fields appear normal. Hearing is normal. Palatal movements are normal. Face symmetric. Tongue midline. Normal strength, tone, reflexes and coordination. Normal sensation. Gait deferred.  ASSESSMENT/PLAN Mr. JERELLE SLESINSKI is a 66 y.o. male with history of HTN,  pre-diabetes, cardiac arrhythmia since teen and OSA presenting with R facial droop, dysarthria and halting speech. He did not receive IV t-PA due to late arrival.   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 No source of embolus   LE venous dopplers to rule out VTE  TEE to look for embolic source. Arranged with [facility] for tomorrow.   (I have made patient NPO after midnight tonight).   If TEE negative, a [facility] electrophysiologist will consult and consider placement of an implantable loop recorder to evaluate for atrial fibrillation as etiology of stroke. This has been explained to patient/family by Dr. Leonie Man and they are agreeable.   LDL 125  HgbA1c 6.8  Pt ambulatory. No VTE prophylaxis ordered Diet Heart Room service appropriate? Yes; Fluid consistency: Thin Diet - low sodium heart healthy  No antithrombotic prior to admission, took aspirin 81 mg prior to arrival to ED, now on aspirin 325 mg daily  Therapy recommendations:  No PT, no OT  Disposition:  Return home (currently works 3 days a week, consider retirement after this hospitalization)  Hypertension  Stable  Permissive  hypertension (OK if < 220/120) but gradually normalize in 5-7 days  Long-term BP goal normotensive  Hyperlipidemia  Home meds:  No statin  LDL above goal  Now on lipitor 80 mg daily  Continue statin at discharge  Pre-Diabetes  HgbA1c at goal < 7.0  Controlled  Other Stroke Risk Factors  Obesity, Body mass index is 37.12 kg/m.  Former smoker  Coronary artery disease  Obstructive sleep apnea, on CPAP at home  UDS not checked  Other Active Problems  Bradycardia  ? atrial fibrillation seen in ED per pt - cardiology will review  strips  Hospital day # 2  PLAN ; Start Plavix for stroke prevention. Outpatient 30 day heart monitor. Follow-up as an outpatient in stroke clinic in 6 weeks. Greater than 50% time during this 25 minute visit was spent on counseling and coordination of care about his stroke, discussion of evaluation and management , aggressive risk factor modification plan and answering questions  Antony Contras, MD Medical Director [place] Pager: [phone] 02/19/2017 6:21 PM  To contact Stroke Continuity provider, please refer to [email or web address] After hours, contact General Neurology

## 2017-02-19 NOTE — Anesthesia Procedure Notes (Signed)
Procedure Name: MAC Date/Time: 02/19/2017 8:15 AM Performed by: Neldon Newport Pre-anesthesia Checklist: Timeout performed, Patient being monitored, Suction available, Emergency Drugs available and Patient identified Patient Re-evaluated:Patient Re-evaluated prior to inductionOxygen Delivery Method: Nasal cannula Placement Confirmation: positive ETCO2

## 2017-02-19 NOTE — Anesthesia Preprocedure Evaluation (Signed)
Anesthesia Evaluation  Patient identified by MRN, date of birth, ID band Patient awake    Reviewed: Allergy & Precautions, H&P , NPO status , Patient's Chart, lab work & pertinent test results  Airway Mallampati: III   Neck ROM: full    Dental   Pulmonary sleep apnea ,    breath sounds clear to auscultation       Cardiovascular hypertension,  Rhythm:regular Rate:Normal     Neuro/Psych CVA    GI/Hepatic   Endo/Other  obese  Renal/GU      Musculoskeletal   Abdominal   Peds  Hematology   Anesthesia Other Findings   Reproductive/Obstetrics                             Anesthesia Physical Anesthesia Plan  ASA: III  Anesthesia Plan: MAC   Post-op Pain Management:    Induction: Intravenous  Airway Management Planned: Nasal Cannula  Additional Equipment:   Intra-op Plan:   Post-operative Plan:   Informed Consent: I have reviewed the patients History and Physical, chart, labs and discussed the procedure including the risks, benefits and alternatives for the proposed anesthesia with the patient or authorized representative who has indicated his/her understanding and acceptance.     Plan Discussed with: CRNA, Anesthesiologist and Surgeon  Anesthesia Plan Comments:         Anesthesia Quick Evaluation

## 2017-02-19 NOTE — H&P (View-Only) (Signed)
ELECTROPHYSIOLOGY CONSULT NOTE  Patient ID: John Finley MRN: [id number], DOB/AGE: [birth year]   Admit date: 02/16/2017 Date of Consult: 02/19/2017  Primary Physician: Foye Spurling, MD Primary Cardiologist: None Reason for Consultation: Cryptogenic stroke ; recommendations regarding Implantable Loop Recorder  History of Present Illness John Finley was admitted on 02/16/2017 with acute CVA.  PMHx includes HTN, HLD, OSA, NOD CAD by old notes.  There is mention of an unclear hx of arrhythmia, the patient though denies this, states that in the last year his Apple watch would note an irregular pulse and he would notice when checking his pulse, but denies any feeling of palpitations otherwise and has never been found with an arrhythmia.   He first developed symptoms while lifting, moving boxes.  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  he has undergone workup for stroke including echocardiogram and carotid angio.  The patient has been monitored on telemetry which has demonstrated sinus rhythm with frequent PAC's, occ PAT's, no arrhythmias.  Inpatient stroke work-up is to be completed with a TEE this morning.   Echocardiogram this admission demonstrated Study Conclusions - Left ventricle: The cavity size was normal. Wall thickness was   increased increased in a pattern of mild to moderate LVH.   Systolic function was normal. The estimated ejection fraction was   in the range of 55% to 60%. Wall motion was normal; there were no   regional wall motion abnormalities. - Aortic valve: Trileaflet; normal thickness, mildly calcified   leaflets. Impressions: - No cardiac source of emboli was indentified.   Lab work is reviewed.  Prior to admission, the patient denies chest pain, shortness of breath, dizziness, palpitations, or syncope.  They are recovering from their stroke with plans to home at discharge.   EP has  been asked to evaluate for placement of an implantable loop recorder to monitor for atrial fibrillation.     Past Medical History:  Diagnosis Date  . Hypertension   . Prediabetes      Surgical History:  Past Surgical History:  Procedure Laterality Date  . ANKLE SURGERY       Prescriptions Prior to Admission  Medication Sig Dispense Refill Last Dose  . amLODipine-valsartan (EXFORGE) 10-320 MG tablet Take 1 tablet by mouth daily.   02/16/2017 at Unknown time  . aspirin EC 81 MG tablet Take 81 mg by mouth once.   02/16/2017 at Unknown time    Inpatient Medications:  .  stroke: mapping our early stages of recovery book   Does not apply Once  . aspirin  300 mg Rectal Daily   Or  . aspirin  325 mg Oral Daily  . atorvastatin  80 mg Oral q1800  . losartan  25 mg Oral BID    Allergies:  Allergies  Allergen Reactions  . Penicillins Itching and Rash  . Sulfa Antibiotics Hives, Itching and Rash    Social History   Social History  . Marital status: Married    Spouse name: N/A  . Number of children: N/A  . Years of education: N/A   Occupational History  . Not on file.   Social History Main Topics  . Smoking status: Never Smoker  . Smokeless tobacco: Never Used  . Alcohol use No  . Drug use: No  . Sexual activity: Not on file   Other Topics Concern  . Not on file   Social History Narrative  . No narrative  on file     History reviewed. No pertinent family history.    Review of Systems: All other systems reviewed and are otherwise negative except as noted above.  Physical Exam: Vitals:   02/18/17 1436 02/18/17 1834 02/18/17 2205 02/19/17 0515  BP: (!) 168/94 (!) 159/90 (!) 175/84 (!) 141/68  Pulse: 83 81 71 (!) 51  Resp: 18 18 18 18   Temp: 98.1 F (36.7 C) 98.3 F (36.8 C) 98.4 F (36.9 C) 98 F (36.7 C)  TempSrc: Oral Oral Oral Oral  SpO2: 98% 97% 97% 98%  Weight:      Height:        GEN- The patient is well appearing, alert and oriented x 3 today.     Head- normocephalic, atraumatic Eyes-  Sclera clear, conjunctiva pink Ears- hearing intact Oropharynx- clear Neck- supple Lungs- CTA b/l , normal work of breathing Heart- RRR, no murmurs, rubs or gallops  GI- soft, NT, ND Extremities- no clubbing, cyanosis, or edema MS- no significant deformity or atrophy Skin- no rash or lesion Psych- euthymic mood, full affect   Labs:   Lab Results  Component Value Date   WBC 8.4 02/18/2017   HGB 13.8 02/18/2017   HCT 40.6 02/18/2017   MCV 86.4 02/18/2017   PLT 248 02/18/2017    Recent Labs Lab 02/18/17 0344  NA 139  K 4.1  CL 108  CO2 25  BUN 13  CREATININE 1.21  CALCIUM 9.3  PROT 6.7  BILITOT 1.2  ALKPHOS 59  ALT 24  AST 25  GLUCOSE 121*   Lab Results  Component Value Date   CKTOTAL 437 (H) 07/11/2011   CKMB 5.0 (H) 07/11/2011   TROPONINI 0.06 (HH) 02/17/2017   Lab Results  Component Value Date   CHOL 178 02/17/2017   Lab Results  Component Value Date   HDL 32 (L) 02/17/2017   Lab Results  Component Value Date   LDLCALC 125 (H) 02/17/2017   Lab Results  Component Value Date   TRIG 104 02/17/2017   Lab Results  Component Value Date   CHOLHDL 5.6 02/17/2017   No results found for: LDLDIRECT  No results found for: DDIMER   Radiology/Studies:  Dg Chest 2 View Result Date: 02/17/2017 CLINICAL DATA:  Recent stroke.  COPD. EXAM: CHEST  2 VIEW COMPARISON:  03/09/2016 FINDINGS: Cardiac silhouette is normal in size. Thoracic tortuous crash that thoracic aortic tortuosity is unchanged. The lungs are hyperinflated without evidence of airspace consolidation, edema, pleural effusion, or pneumothorax. No acute osseous abnormality is identified. IMPRESSION: No active cardiopulmonary disease. Electronically Signed   By: Logan Bores M.D.   On: 02/17/2017 14:19   Ct Head Wo Contrast Result Date: 02/16/2017 CLINICAL DATA:  Right facial droop.  Difficulty speaking. EXAM: CT HEAD WITHOUT CONTRAST TECHNIQUE: Contiguous axial  images were obtained from the base of the skull through the vertex without intravenous contrast. COMPARISON:  06/22/2005 head CT. FINDINGS: Brain: No evidence of parenchymal hemorrhage or extra-axial fluid collection. No mass lesion, mass effect, or midline shift. No CT evidence of acute infarction. Nonspecific mild subcortical and periventricular white matter hypodensity, most in keeping with chronic small vessel ischemic change. Cerebral volume is age appropriate. No ventriculomegaly. Vascular: No hyperdense vessel or unexpected calcification. Intracranial atherosclerosis. Skull: No evidence of calvarial fracture. Sinuses/Orbits: Mild mucoperiosteal thickening in the bilateral ethmoidal air cells and left maxillary sinus. No fluid levels in the visualized paranasal sinuses. Other:  The mastoid air cells are unopacified. IMPRESSION: 1.  No evidence of acute intracranial abnormality. 2. Mild chronic small vessel ischemia. 3. Mild paranasal sinusitis, probably chronic. Electronically Signed   By: Ilona Sorrel M.D.   On: 02/16/2017 18:49   Mr Angiogram Head Wo Contrast Result Date: 02/16/2017 CLINICAL DATA:  Facial droop, unsteady gait and difficulty speaking. Hypertension. EXAM: MR HEAD WITHOUT CONTRAST MR CIRCLE OF WILLIS WITHOUT CONTRAST MRA OF THE NECK WITHOUT AND WITH CONTRAST TECHNIQUE: Multiplanar, multiecho pulse sequences of the brain, circle of willis and surrounding structures were obtained without intravenous contrast. Angiographic images of the neck were obtained using MRA technique without and with intravenous contrast. CONTRAST:  86mL MULTIHANCE GADOBENATE DIMEGLUMINE 529 MG/ML IV SOLN COMPARISON:  Head CT 02/16/2017 FINDINGS: MR HEAD FINDINGS Brain: There is a small area of diffusion restriction within the left corona radiata extending superiorly to the precentral gyrus. There is multifocal hyperintense T2-weighted signal within the periventricular white matter, most often seen in the setting of  chronic microvascular ischemia. No mass lesion or midline shift. No hydrocephalus or extra-axial fluid collection. The midline structures are normal. No age advanced or lobar predominant atrophy. Vascular: Major intracranial arterial and venous sinus flow voids are preserved. No evidence of chronic microhemorrhage or amyloid angiopathy. Skull and upper cervical spine: The visualized skull base, calvarium, upper cervical spine and extracranial soft tissues are normal. Sinuses/Orbits: No fluid levels or advanced mucosal thickening. No mastoid effusion. Normal orbits. MR CIRCLE OF WILLIS FINDINGS Intracranial internal carotid arteries: Normal. Anterior cerebral arteries: Normal. Middle cerebral arteries: There is mild multifocal narrowing of both middle cerebral arteries. However, this is favored to be an artifact caused by patient motion. Otherwise, the MCAs are normal. Posterior communicating arteries: Present on the left. Posterior cerebral arteries: Normal. Basilar artery: Normal. Vertebral arteries: Left dominant. The right vertebral artery terminates in the right posterior-inferior cerebellar artery. Superior cerebellar arteries: Normal. Anterior inferior cerebellar arteries: Normal. Posterior inferior cerebellar arteries: Normal. MRA NECK FINDINGS The visualized aortic arch is normal. Proximal subclavian arteries are normal. Poor visualization of the vertebral artery origins. The vertebral system is left dominant without focal stenosis along either cervical segment. There is multifocal narrowing of the diminutive right vertebral artery V4 segment. There is no common or internal carotid artery stenosis. IMPRESSION: 1. Acute ischemia within the left corona radiata extending to the left precentral gyrus. This is in keeping with the reported right-sided facial symptoms. 2. No acute hemorrhage or mass effect. 3. No intracranial or cervical arterial occlusion or high-grade stenosis. 4. Apparent mild atherosclerotic  narrowing of both middle cerebral arteries is favored to be an artifact due to motion. 5. Multifocal narrowing of the non-dominant right vertebral artery V4 segment, prior to its termination in the right PICA. These results will be called to the ordering clinician or representative by the Radiologist Assistant, and communication documented in the PACS or zVision Dashboard. Electronically Signed   By: Ulyses Jarred M.D.   On: 02/16/2017 23:38      12-lead ECG SR All prior EKG's in EPIC reviewed with no documented atrial fibrillation  Telemetry SR, at times frequent PAC's, brief PAT's  Assessment and Plan:  1. Cryptogenic stroke The patient presents with cryptogenic stroke.  The patient has a TEE planned for this AM.  I spoke at length with the patient about monitoring for afib with either a 30 day event monitor or an implantable loop recorder, he is leaning towards a 30 day monitor though would like to discuss further with Dr. Rayann Heman.  Risks, benefits, and  alteratives to implantable loop recorder were discussed with the patient today.   Will await further discussion and decision.  Renee Dyane Dustman, PA-C 02/19/2017   I have seen, examined the patient, and reviewed the above assessment and plan. On exam, RRR.  Changes to above are made where necessary.  On tele, he has pacs and nonsustained atach.  LA enlarged.  He is at risk for afib.  We discussed 30 monitor and ILR placement.  He is clear at this time that he would prefer 30 day monitor placement at this time.  He will follow-up with me in 6 weeks to discuss the results.  He may be more interested in ILR if his 30 day monitor is unrevealing.  Electrophysiology team to see as needed while here. Please call with questions.   Co Sign: Thompson Grayer, MD 02/19/2017 7:33 AM

## 2017-02-19 NOTE — Discharge Summary (Signed)
Physician Discharge Summary   Patient ID: John Finley MRN: [id number] DOB/AGE: Jul 28, 1951 66 y.o.  Admit date: 02/16/2017 Discharge date: 02/19/2017  Primary Care Physician:  Foye Spurling, MD  Discharge Diagnoses:   : . Acute  cerebrovascular accident (CVA) ([place]) . Hypertensive urgency . Hyperlipidemia   Consults:  Cardiology Neurology  Recommendations for Outpatient Follow-up:  1. The patient started on losartan 100 mg daily, Plavix 75 mg daily, Lipitor 40 mg daily 2. Please repeat CBC/BMET at next visit 3. Event monitor will be placed by cardiology,   DIET: Heart healthy diet    Allergies:   Allergies  Allergen Reactions  . Penicillins Itching and Rash  . Sulfa Antibiotics Hives, Itching and Rash     DISCHARGE MEDICATIONS: Current Discharge Medication List    START taking these medications   Details  atorvastatin (LIPITOR) 40 MG tablet Take 1 tablet (40 mg total) by mouth at bedtime. Qty: 90 tablet, Refills: 3    clopidogrel (PLAVIX) 75 MG tablet Take 1 tablet (75 mg total) by mouth daily. Qty: 90 tablet, Refills: 3    losartan (COZAAR) 100 MG tablet Take 1 tablet (100 mg total) by mouth daily. Qty: 30 tablet, Refills: 3      STOP taking these medications     amLODipine-valsartan (EXFORGE) 10-320 MG tablet      aspirin EC 81 MG tablet          Brief H and P: For complete details please refer to admission H and P, but in brief 66 y.o.maleand local cardiologistwith medical history significant for hypertension, OSA on CPAP, prediabetes, and cervical and lumbar degenerative disc disease who presents to the emergency department with right facial droop and slurred speech. Patient reported that he had been in his usual state of health until yesterday when he was playing with his grandson and his grandson noted him to be drooling from the right side of his mouth. Patient and his family then noted a slight right facial droop. He was also noted to have  difficulty speaking with slurring. Patient also reports difficulty "getting the words out," but denied trouble with word-finding. These symptoms were first noted shortly after he woke yesterday. He reported occasional palpitations which she has attributed to PACs, but denied any headache or change in vision or hearing. There had not been any significant focal numbness or weakness other than in the right face. There's been no recent fall or trauma and he has never experienced similar symptoms previously. He reported taking an aspirin this afternoon before coming in for evaluation. He is a former smoker.  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 Chemistry panel was unremarkable and so a CBC. INR is within the normal limits, as is troponin. Neurology was consulted by the ED physician and advised a medical admission with MRI and further evaluation of strokelike symptoms. 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 Per neurology, recommended Plavix 75 mg daily Lower extremity venous Dopplers negative for VTE - Outpatient speech therapy recommended to be arranged by case management  Hypertension urgency BP not well-controlled at the time of admission patient was on Exforge at max dose. Exforge was discontinued and patient was started on losartan.    Hyperlipidemia LDL 125, started on Lipitor 40 mg daily  Elevated troponins - Possibly due to acute CVA. Cardiology was consulted and felt 2-D echo showed normal LVEF and troponins were minimally elevated, patient had EKG changes with T-wave inversions in V4 to V6 that likely represented LVH with repolarization. Could consider outpatient nuclear stress testing given history of CAD in 2006. - Outpatient cardiology follow-up  Day of Discharge BP (!) 148/88 (BP Location: Right Arm)   Pulse (!) 58   Temp 98.1 F (36.7 C) (Oral)   Resp 18   Ht 5\' 6"  (1.676 m)   Wt 104.3 kg (230 lb)   SpO2 98%   BMI 37.12 kg/m   Physical Exam: General: Alert and awake oriented x3 not in any acute distress. HEENT: anicteric sclera, pupils reactive to light and accommodation CVS: S1-S2 clear no murmur rubs or gallops Chest: clear to auscultation bilaterally, no wheezing rales or rhonchi Abdomen: soft nontender, nondistended, normal bowel sounds Extremities: no cyanosis, clubbing or edema noted bilaterally Neuro: Cranial nerves II-XII intact, no focal neurological deficits   The results of significant diagnostics from this hospitalization (including imaging, microbiology, ancillary and laboratory) are listed below for reference.    LAB RESULTS: Basic Metabolic Panel:  Recent Labs Lab 02/16/17 1743 02/16/17 1802 02/18/17 0344  NA 139 141 139  K 3.7 3.7 4.1  CL 104 104 108  CO2 25  --  25  GLUCOSE 96 93 121*  BUN 11 13 13   CREATININE 1.20 1.20 1.21  CALCIUM 9.7  --  9.3   Liver Function Tests:  Recent Labs Lab 02/16/17 1743 02/18/17 0344  AST 41 25  ALT 29 24  ALKPHOS 63 59  BILITOT 1.2 1.2  PROT 8.0 6.7  ALBUMIN 4.6 3.8   No results for input(s): LIPASE, AMYLASE in the last 168 hours. No results for input(s): AMMONIA in the last 168 hours. CBC:  Recent Labs Lab 02/16/17 1743 02/16/17 1802 02/18/17 0344  WBC 9.4  --  8.4  NEUTROABS 5.3  --   --   HGB 14.3 15.0 13.8   HCT 42.2 44.0 40.6  MCV 85.9  --  86.4  PLT 257  --  248   Cardiac Enzymes:  Recent Labs Lab 02/17/17 1551 02/17/17 2138  TROPONINI 0.07* 0.06*   BNP: Invalid input(s): POCBNP CBG: No results for input(s): GLUCAP in the last 168 hours.  Significant Diagnostic Studies:  Dg Chest 2 View  Result Date: 02/17/2017 CLINICAL DATA:  Recent stroke.  COPD. EXAM: CHEST  2 VIEW COMPARISON:  03/09/2016 FINDINGS: Cardiac silhouette is normal in size. Thoracic tortuous crash that thoracic aortic tortuosity is unchanged. The lungs are hyperinflated without evidence of airspace consolidation, edema, pleural effusion, or pneumothorax. No acute osseous abnormality is identified. IMPRESSION: No active cardiopulmonary disease. Electronically Signed   By: Logan Bores M.D.   On: 02/17/2017 14:19   Ct Head Wo Contrast  Result Date: 02/16/2017 CLINICAL DATA:  Right facial droop.  Difficulty speaking. EXAM: CT HEAD WITHOUT CONTRAST TECHNIQUE: Contiguous axial images were obtained from the base of the skull through the vertex without intravenous contrast. COMPARISON:  06/22/2005 head CT.  FINDINGS: Brain: No evidence of parenchymal hemorrhage or extra-axial fluid collection. No mass lesion, mass effect, or midline shift. No CT evidence of acute infarction. Nonspecific mild subcortical and periventricular white matter hypodensity, most in keeping with chronic small vessel ischemic change. Cerebral volume is age appropriate. No ventriculomegaly. Vascular: No hyperdense vessel or unexpected calcification. Intracranial atherosclerosis. Skull: No evidence of calvarial fracture. Sinuses/Orbits: Mild mucoperiosteal thickening in the bilateral ethmoidal air cells and left maxillary sinus. No fluid levels in the visualized paranasal sinuses. Other:  The mastoid air cells are unopacified. IMPRESSION: 1.  No evidence of acute intracranial abnormality. 2. Mild chronic small vessel ischemia. 3. Mild paranasal sinusitis, probably  chronic. Electronically Signed   By: Ilona Sorrel M.D.   On: 02/16/2017 18:49   Mr Angiogram Head Wo Contrast  Result Date: 02/16/2017 CLINICAL DATA:  Facial droop, unsteady gait and difficulty speaking. Hypertension. EXAM: MR HEAD WITHOUT CONTRAST MR CIRCLE OF WILLIS WITHOUT CONTRAST MRA OF THE NECK WITHOUT AND WITH CONTRAST TECHNIQUE: Multiplanar, multiecho pulse sequences of the brain, circle of willis and surrounding structures were obtained without intravenous contrast. Angiographic images of the neck were obtained using MRA technique without and with intravenous contrast. CONTRAST:  37mL MULTIHANCE GADOBENATE DIMEGLUMINE 529 MG/ML IV SOLN COMPARISON:  Head CT 02/16/2017 FINDINGS: MR HEAD FINDINGS Brain: There is a small area of diffusion restriction within the left corona radiata extending superiorly to the precentral gyrus. There is multifocal hyperintense T2-weighted signal within the periventricular white matter, most often seen in the setting of chronic microvascular ischemia. No mass lesion or midline shift. No hydrocephalus or extra-axial fluid collection. The midline structures are normal. No age advanced or lobar predominant atrophy. Vascular: Major intracranial arterial and venous sinus flow voids are preserved. No evidence of chronic microhemorrhage or amyloid angiopathy. Skull and upper cervical spine: The visualized skull base, calvarium, upper cervical spine and extracranial soft tissues are normal. Sinuses/Orbits: No fluid levels or advanced mucosal thickening. No mastoid effusion. Normal orbits. MR CIRCLE OF WILLIS FINDINGS Intracranial internal carotid arteries: Normal. Anterior cerebral arteries: Normal. Middle cerebral arteries: There is mild multifocal narrowing of both middle cerebral arteries. However, this is favored to be an artifact caused by patient motion. Otherwise, the MCAs are normal. Posterior communicating arteries: Present on the left. Posterior cerebral arteries: Normal.  Basilar artery: Normal. Vertebral arteries: Left dominant. The right vertebral artery terminates in the right posterior-inferior cerebellar artery. Superior cerebellar arteries: Normal. Anterior inferior cerebellar arteries: Normal. Posterior inferior cerebellar arteries: Normal. MRA NECK FINDINGS The visualized aortic arch is normal. Proximal subclavian arteries are normal. Poor visualization of the vertebral artery origins. The vertebral system is left dominant without focal stenosis along either cervical segment. There is multifocal narrowing of the diminutive right vertebral artery V4 segment. There is no common or internal carotid artery stenosis. IMPRESSION: 1. Acute ischemia within the left corona radiata extending to the left precentral gyrus. This is in keeping with the reported right-sided facial symptoms. 2. No acute hemorrhage or mass effect. 3. No intracranial or cervical arterial occlusion or high-grade stenosis. 4. Apparent mild atherosclerotic narrowing of both middle cerebral arteries is favored to be an artifact due to motion. 5. Multifocal narrowing of the non-dominant right vertebral artery V4 segment, prior to its termination in the right PICA. These results will be called to the ordering clinician or representative by the Radiologist Assistant, and communication documented in the PACS or zVision Dashboard. Electronically Signed   By: Ulyses Jarred M.D.   On: 02/16/2017  23:38   Mr Angiogram Neck W Or Wo Contrast  Result Date: 02/16/2017 CLINICAL DATA:  Facial droop, unsteady gait and difficulty speaking. Hypertension. EXAM: MR HEAD WITHOUT CONTRAST MR CIRCLE OF WILLIS WITHOUT CONTRAST MRA OF THE NECK WITHOUT AND WITH CONTRAST TECHNIQUE: Multiplanar, multiecho pulse sequences of the brain, circle of willis and surrounding structures were obtained without intravenous contrast. Angiographic images of the neck were obtained using MRA technique without and with intravenous contrast. CONTRAST:   67mL MULTIHANCE GADOBENATE DIMEGLUMINE 529 MG/ML IV SOLN COMPARISON:  Head CT 02/16/2017 FINDINGS: MR HEAD FINDINGS Brain: There is a small area of diffusion restriction within the left corona radiata extending superiorly to the precentral gyrus. There is multifocal hyperintense T2-weighted signal within the periventricular white matter, most often seen in the setting of chronic microvascular ischemia. No mass lesion or midline shift. No hydrocephalus or extra-axial fluid collection. The midline structures are normal. No age advanced or lobar predominant atrophy. Vascular: Major intracranial arterial and venous sinus flow voids are preserved. No evidence of chronic microhemorrhage or amyloid angiopathy. Skull and upper cervical spine: The visualized skull base, calvarium, upper cervical spine and extracranial soft tissues are normal. Sinuses/Orbits: No fluid levels or advanced mucosal thickening. No mastoid effusion. Normal orbits. MR CIRCLE OF WILLIS FINDINGS Intracranial internal carotid arteries: Normal. Anterior cerebral arteries: Normal. Middle cerebral arteries: There is mild multifocal narrowing of both middle cerebral arteries. However, this is favored to be an artifact caused by patient motion. Otherwise, the MCAs are normal. Posterior communicating arteries: Present on the left. Posterior cerebral arteries: Normal. Basilar artery: Normal. Vertebral arteries: Left dominant. The right vertebral artery terminates in the right posterior-inferior cerebellar artery. Superior cerebellar arteries: Normal. Anterior inferior cerebellar arteries: Normal. Posterior inferior cerebellar arteries: Normal. MRA NECK FINDINGS The visualized aortic arch is normal. Proximal subclavian arteries are normal. Poor visualization of the vertebral artery origins. The vertebral system is left dominant without focal stenosis along either cervical segment. There is multifocal narrowing of the diminutive right vertebral artery V4  segment. There is no common or internal carotid artery stenosis. IMPRESSION: 1. Acute ischemia within the left corona radiata extending to the left precentral gyrus. This is in keeping with the reported right-sided facial symptoms. 2. No acute hemorrhage or mass effect. 3. No intracranial or cervical arterial occlusion or high-grade stenosis. 4. Apparent mild atherosclerotic narrowing of both middle cerebral arteries is favored to be an artifact due to motion. 5. Multifocal narrowing of the non-dominant right vertebral artery V4 segment, prior to its termination in the right PICA. These results will be called to the ordering clinician or representative by the Radiologist Assistant, and communication documented in the PACS or zVision Dashboard. Electronically Signed   By: Ulyses Jarred M.D.   On: 02/16/2017 23:38   Mr Brain Wo Contrast  Result Date: 02/16/2017 CLINICAL DATA:  Facial droop, unsteady gait and difficulty speaking. Hypertension. EXAM: MR HEAD WITHOUT CONTRAST MR CIRCLE OF WILLIS WITHOUT CONTRAST MRA OF THE NECK WITHOUT AND WITH CONTRAST TECHNIQUE: Multiplanar, multiecho pulse sequences of the brain, circle of willis and surrounding structures were obtained without intravenous contrast. Angiographic images of the neck were obtained using MRA technique without and with intravenous contrast. CONTRAST:  44mL MULTIHANCE GADOBENATE DIMEGLUMINE 529 MG/ML IV SOLN COMPARISON:  Head CT 02/16/2017 FINDINGS: MR HEAD FINDINGS Brain: There is a small area of diffusion restriction within the left corona radiata extending superiorly to the precentral gyrus. There is multifocal hyperintense T2-weighted signal within the periventricular white matter, most  often seen in the setting of chronic microvascular ischemia. No mass lesion or midline shift. No hydrocephalus or extra-axial fluid collection. The midline structures are normal. No age advanced or lobar predominant atrophy. Vascular: Major intracranial arterial and  venous sinus flow voids are preserved. No evidence of chronic microhemorrhage or amyloid angiopathy. Skull and upper cervical spine: The visualized skull base, calvarium, upper cervical spine and extracranial soft tissues are normal. Sinuses/Orbits: No fluid levels or advanced mucosal thickening. No mastoid effusion. Normal orbits. MR CIRCLE OF WILLIS FINDINGS Intracranial internal carotid arteries: Normal. Anterior cerebral arteries: Normal. Middle cerebral arteries: There is mild multifocal narrowing of both middle cerebral arteries. However, this is favored to be an artifact caused by patient motion. Otherwise, the MCAs are normal. Posterior communicating arteries: Present on the left. Posterior cerebral arteries: Normal. Basilar artery: Normal. Vertebral arteries: Left dominant. The right vertebral artery terminates in the right posterior-inferior cerebellar artery. Superior cerebellar arteries: Normal. Anterior inferior cerebellar arteries: Normal. Posterior inferior cerebellar arteries: Normal. MRA NECK FINDINGS The visualized aortic arch is normal. Proximal subclavian arteries are normal. Poor visualization of the vertebral artery origins. The vertebral system is left dominant without focal stenosis along either cervical segment. There is multifocal narrowing of the diminutive right vertebral artery V4 segment. There is no common or internal carotid artery stenosis. IMPRESSION: 1. Acute ischemia within the left corona radiata extending to the left precentral gyrus. This is in keeping with the reported right-sided facial symptoms. 2. No acute hemorrhage or mass effect. 3. No intracranial or cervical arterial occlusion or high-grade stenosis. 4. Apparent mild atherosclerotic narrowing of both middle cerebral arteries is favored to be an artifact due to motion. 5. Multifocal narrowing of the non-dominant right vertebral artery V4 segment, prior to its termination in the right PICA. These results will be called  to the ordering clinician or representative by the Radiologist Assistant, and communication documented in the PACS or zVision Dashboard. Electronically Signed   By: Ulyses Jarred M.D.   On: 02/16/2017 23:38    2D ECHO: Study Conclusions  - Left ventricle: The cavity size was normal. Wall thickness was   increased increased in a pattern of mild to moderate LVH.   Systolic function was normal. The estimated ejection fraction was   in the range of 55% to 60%. Wall motion was normal; there were no   regional wall motion abnormalities. - Aortic valve: Trileaflet; normal thickness, mildly calcified   leaflets.  Impressions:  - No cardiac source of emboli was indentified.   Disposition and Follow-up: Discharge Instructions    Ambulatory referral to Neurology    Complete by:  As directed    An appointment is requested in approximately: 4-6 Week for stroke follow-up   Diet - low sodium heart healthy    Complete by:  As directed    Discharge instructions    Complete by:  As directed    Please check BP daily.  Cardiology office will call you once event monitor is ready.   Increase activity slowly    Complete by:  As directed        DISPOSITION: home    [place], MD Follow up on 04/01/2017.   Specialty:  Cardiology Why:  11:45AM Contact information: [place] [address] [phone]        [place] [facility] Follow up.   Specialty:  Cardiology Why:  an office scheduler will be  callling you to arrange event monitoring/hook up.  Contact information: [address] [place] [phone]       SETHI,PRAMOD, MD. Schedule an appointment as soon as possible for a visit in 4 week(s).   Specialties:  Neurology, Radiology Why:  stroke follow-up Contact information: [address] [place] [address] [phone]         Foye Spurling, MD. Schedule an appointment as soon as possible for a visit in 2 week(s).   Specialty:  Internal Medicine Contact information: [address] [place] Hartmann [place] [address] [phone]            Time spent on Discharge: 38mins   Signed:   Alwyn Cordner M.D. [facility] 02/19/2017, 2:58 PM Pager: [phone]

## 2017-02-19 NOTE — Progress Notes (Signed)
Patient has home CPAP and places self on and off when ready. RT will continue to monitor.

## 2017-02-19 NOTE — Transfer of Care (Signed)
Immediate Anesthesia Transfer of Care Note  Patient: [name]  Procedure(s) Performed: Procedure(s): TRANSESOPHAGEAL ECHOCARDIOGRAM (TEE) (N/A)  Patient Location: Endoscopy Unit  Anesthesia Type:MAC  Level of Consciousness: awake, alert  and oriented  Airway & Oxygen Therapy: Patient Spontanous Breathing and Patient connected to nasal cannula oxygen  Post-op Assessment: Report given to RN, Post -op Vital signs reviewed and stable, Patient moving all extremities X 4 and Patient able to stick tongue midline  Post vital signs: Reviewed and stable  Last Vitals:  Vitals:   02/19/17 0515 02/19/17 0736  BP: (!) 141/68 (!) 181/112  Pulse: (!) 51   Resp: 18 19  Temp: 36.7 C 36.7 C    Last Pain:  Vitals:   02/19/17 0736  TempSrc: Oral  PainSc:          Complications: No apparent anesthesia complications

## 2017-02-19 NOTE — Care Management Note (Signed)
Case Management Note  Patient Details  Name: John Finley MRN: [id number] Date of Birth: 02/25/1951  Subjective/Objective:                    Action/Plan: Pt discharging home with orders for HH services. CM met with the patients wife and provided her a list of HH agencies. CM was later given the information by the bedside RN that Dr Cressman wanted to use [facility]. CM called [facility] and left a message for return call. CM will f/u with [facility].  Expected Discharge Date:  02/19/17               Expected Discharge Plan:  Home w Home Health Services  In-House Referral:     Discharge planning Services  CM Consult  Post Acute Care Choice:  Home Health Choice offered to:  Patient  DME Arranged:    DME Agency:     HH Arranged:  Speech Therapy HH Agency:  [facility]  Status of Service:  Completed, signed off  If discussed at Long Length of Stay Meetings, dates discussed:    Additional Comments:   F , RN 02/19/2017, 7:21 PM  

## 2017-02-19 NOTE — Interval H&P Note (Signed)
History and Physical Interval Note:  02/19/2017 8:12 AM  John Finley  has presented today for surgery, with the diagnosis of STROKE  The various methods of treatment have been discussed with the patient and family. After consideration of risks, benefits and other options for treatment, the patient has consented to  Procedure(s): TRANSESOPHAGEAL ECHOCARDIOGRAM (TEE) (N/A) as a surgical intervention .  The patient's history has been reviewed, patient examined, no change in status, stable for surgery.  I have reviewed the patient's chart and labs.  Questions were answered to the patient's satisfaction.     Ena Dawley

## 2017-02-22 ENCOUNTER — Ambulatory Visit (INDEPENDENT_AMBULATORY_CARE_PROVIDER_SITE_OTHER): Payer: 59

## 2017-02-22 ENCOUNTER — Other Ambulatory Visit: Payer: Self-pay | Admitting: Physician Assistant

## 2017-02-22 DIAGNOSIS — I639 Cerebral infarction, unspecified: Secondary | ICD-10-CM | POA: Diagnosis not present

## 2017-02-22 DIAGNOSIS — R002 Palpitations: Secondary | ICD-10-CM

## 2017-04-01 ENCOUNTER — Ambulatory Visit (INDEPENDENT_AMBULATORY_CARE_PROVIDER_SITE_OTHER): Payer: 59 | Admitting: Internal Medicine

## 2017-04-01 ENCOUNTER — Encounter: Payer: Self-pay | Admitting: Internal Medicine

## 2017-04-01 VITALS — BP 142/80 | HR 77 | Ht 66.0 in | Wt 218.5 lb

## 2017-04-01 DIAGNOSIS — I10 Essential (primary) hypertension: Secondary | ICD-10-CM

## 2017-04-01 DIAGNOSIS — R002 Palpitations: Secondary | ICD-10-CM

## 2017-04-01 NOTE — Progress Notes (Signed)
PCP: Foye Spurling, MD  John Finley is a 66 y.o. male who presents today for routine electrophysiology followup.  He is making slow recovery from his stroke.   Reports difficulty with short term memory.  He is retiring from [employer].  He has rare palpitations.  Recent 30 day monitor is reviewed with him today and reveals only PACs.  Today, he denies symptoms of  chest pain, shortness of breath,  lower extremity edema, dizziness, presyncope, or syncope.  The patient is otherwise without complaint today.   Past Medical History:  Diagnosis Date  . Hypertension   . Prediabetes   . Sleep apnea    Past Surgical History:  Procedure Laterality Date  . ANKLE SURGERY    . TEE WITHOUT CARDIOVERSION N/A 02/19/2017   Procedure: TRANSESOPHAGEAL ECHOCARDIOGRAM (TEE);  Surgeon: Dorothy Spark, MD;  Location: [facility] ENDOSCOPY;  Service: Cardiovascular;  Laterality: N/A;    ROS- all systems are reviewed and negatives except as per HPI above  Current Outpatient Prescriptions  Medication Sig Dispense Refill  . amLODipine (NORVASC) 5 MG tablet Take 5 mg by mouth daily.    Marland Kitchen atorvastatin (LIPITOR) 40 MG tablet Take 1 tablet (40 mg total) by mouth at bedtime. 90 tablet 3  . clopidogrel (PLAVIX) 75 MG tablet Take 1 tablet (75 mg total) by mouth daily. 90 tablet 3  . hydrochlorothiazide (MICROZIDE) 12.5 MG capsule Take 12.5 mg by mouth daily.    Marland Kitchen losartan (COZAAR) 100 MG tablet Take 1 tablet (100 mg total) by mouth daily. 30 tablet 3   No current facility-administered medications for this visit.     Physical Exam: Vitals:   04/01/17 1141  BP: (!) 142/80  Pulse: 77  SpO2: 96%  Weight: 218 lb 8 oz (99.1 kg)  Height: 5\' 6"  (1.676 m)    GEN- The patient is well appearing, alert and oriented x 3 today.   Head- normocephalic, atraumatic Eyes-  Sclera clear, conjunctiva pink Ears- hearing intact Oropharynx- clear Lungs- Clear to ausculation bilaterally, normal work of breathing Heart- Regular rate  and rhythm, no murmurs, rubs or gallops, PMI not laterally displaced GI- soft, NT, ND, + BS Extremities- no clubbing, cyanosis, or edema  30 day monitor reviewed in detail with patient today  Assessment and Plan:  1. Palpitations pacs Conservative management  2. Cryptogenic stroke I have advised ILR implant.  He declines today due to cost concerns but may be more willing to consider in the future  3. HTN Lifestyle modification encouraged  4. OSA Reports compliance with CPAP  5. Obesity Body mass index is 35.27 kg/m. Weight loss/ lifestyle modification encouraged  Thompson Grayer MD, [facility] 04/01/2017 12:50 PM

## 2017-04-01 NOTE — Patient Instructions (Signed)

## 2017-04-18 ENCOUNTER — Ambulatory Visit (INDEPENDENT_AMBULATORY_CARE_PROVIDER_SITE_OTHER): Payer: 59 | Admitting: Neurology

## 2017-04-18 ENCOUNTER — Encounter: Payer: Self-pay | Admitting: Neurology

## 2017-04-18 VITALS — BP 157/86 | HR 67 | Wt 213.4 lb

## 2017-04-18 DIAGNOSIS — I639 Cerebral infarction, unspecified: Secondary | ICD-10-CM

## 2017-04-18 NOTE — Progress Notes (Signed)
[facility]    Provider:  Dr Jaynee Eagles Referring Provider: Mendel Corning, MD Primary Care Physician:  Mendel Corning, MD  CC:  Follow-up for stroke  HPI:  John Finley is a 66 y.o. male here as a referral from Dr. Tana Coast for follow-up for stroke. Past medical history of obstructive sleep apnea CPAP, HTN, cardiac dysrhythmia, coronary artery disease and prediabetes. He presented to the emergency room for assessment of facial droop, unsteady gait, mild dysarthria and halting speech. He presented March 3 symptoms started the day before. He could not control his secretions and was drooling on the right side of his mouth. He was not on antiplatelet medication at the time. He improved in the hospital. He preferred Plavix for stroke prevention. MRI of the brain showed left corona radiata infarct extending to the left precentral gyrus which is embolic due to unknown source.No other focal neurologic deficits, associated symptoms, inciting events or modifiable factors.  Reviewed notes, labs and imaging from outside physicians, which showed:  Personally reviewed imaging and agree with the following:  IMPRESSION: 1. Acute ischemia within the left corona radiata extending to the left precentral gyrus. This is in keeping with the reported right-sided facial symptoms. 2. No acute hemorrhage or mass effect. 3. No intracranial or cervical arterial occlusion or high-grade stenosis. 4. Apparent mild atherosclerotic narrowing of both middle cerebral arteries is favored to be an artifact due to motion. 5. Multifocal narrowing of the non-dominant right vertebral artery V4 segment, prior to its termination in the right PICA.  reviwed labs ldl 125, hgba1c 6.8  Review of Systems: Patient complains of symptoms per HPI as well as the following symptoms: no CP, no SOB. Pertinent negatives per HPI. All others negative.   Social History   Social History  . Marital status: Married   Spouse name: N/A  . Number of children: 2  . Years of education: MD   Occupational History  . Cardiologist    Social History Main Topics  . Smoking status: Never Smoker  . Smokeless tobacco: Never Used  . Alcohol use No     Comment: Rare  . Drug use: No  . Sexual activity: Not on file   Other Topics Concern  . Not on file   Social History Narrative   Lives at home w/ his wife   Right-handed   Caffeine: 1 cup coffee per day    Family History  Problem Relation Age of Onset  . Cancer - Other Mother   . Diabetes Mother   . Stroke Mother   . Heart disease Mother   . Other Father     Accident  . Hypertension Sister   . Diabetes Brother   . Stroke Brother     Past Medical History:  Diagnosis Date  . Arrhythmia   . Gastric ulcer   . Herniated cervical disc   . High cholesterol   . Hypertension   . Prediabetes   . Sleep apnea   . Stroke (cerebrum) [facility]     Past Surgical History:  Procedure Laterality Date  . ANKLE SURGERY    . HERNIA REPAIR    . TEE WITHOUT CARDIOVERSION N/A 02/19/2017   Procedure: TRANSESOPHAGEAL ECHOCARDIOGRAM (TEE);  Surgeon: Dorothy Spark, MD;  Location: [facility] ENDOSCOPY;  Service: Cardiovascular;  Laterality: N/A;    Current Outpatient Prescriptions  Medication Sig Dispense Refill  . amLODipine-valsartan (EXFORGE) 10-320 MG tablet Take 1 tablet by mouth daily.    Marland Kitchen atorvastatin (LIPITOR) 40  MG tablet Take 1 tablet (40 mg total) by mouth at bedtime. 90 tablet 3  . CLINDAMYCIN HCL PO Take by mouth 2 (two) times daily.    . clopidogrel (PLAVIX) 75 MG tablet Take 1 tablet (75 mg total) by mouth daily. 90 tablet 3  . hydrochlorothiazide (MICROZIDE) 12.5 MG capsule Take 12.5 mg by mouth daily.     No current facility-administered medications for this visit.     Allergies as of 04/18/2017 - Review Complete 04/18/2017  Allergen Reaction Noted  . Penicillins Itching and Rash 10/05/2016  . Sulfa antibiotics Hives, Itching, and Rash 10/05/2016      Vitals: BP (!) 157/86   Pulse 67   Wt 213 lb 6.4 oz (96.8 kg)   BMI 34.44 kg/m  Last Weight:  Wt Readings from Last 1 Encounters:  04/18/17 213 lb 6.4 oz (96.8 kg)   Last Height:   Ht Readings from Last 1 Encounters:  04/01/17 5\' 6"  (1.676 m)    Physical exam: Exam: Gen: NAD, conversant, well nourised, obese, well groomed                     CV: RRR, no MRG. No Carotid Bruits. No peripheral edema, warm, nontender Eyes: Conjunctivae clear without exudates or hemorrhage  Neuro: Detailed Neurologic Exam  Speech:    Speech is normal; fluent and spontaneous with normal comprehension.  Cognition:    The patient is oriented to person, place, and time;     recent and remote memory intact;     language fluent;     normal attention, concentration,     fund of knowledge Cranial Nerves:    The pupils are equal, round, and reactive to light. The fundi are normal and spontaneous venous pulsations are present. Visual fields are full to finger confrontation. Extraocular movements are intact. Trigeminal sensation is intact and the muscles of mastication are normal. The face is symmetric. The palate elevates in the midline. Hearing intact. Voice is normal. Shoulder shrug is normal. The tongue has normal motion without fasciculations.   Coordination:    Normal finger to nose and heel to shin. Normal rapid alternating movements.   Gait:    Heel-toe and tandem gait are normal.   Motor Observation:    No asymmetry, no atrophy, and no involuntary movements noted. Tone:    Normal muscle tone.    Posture:    Posture is normal. normal erect    Strength:    Strength is V/V in the upper and lower limbs.      Sensation: intact to LT     Reflex Exam:  DTR's:    Deep tendon reflexes in the upper and lower extremities are normal bilaterally.   Toes:    The toes are downgoing bilaterally.   Clonus:    Clonus is absent.      Assessment/Plan:  Mr. John Finley is a 65 y.o.  male with history of HTN, pre-diabetes, cardiac arrhythmia since teen and OSA presented with R facial droop, dysarthria and halting speech. He did not receive IV t-PA due to late arrival. Symptoms resolved.   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 No source of embolus   LDL 125, HgbA1c 6.8  Recommend loop discussed increased risk of stroke  with afib/aflutter and he declines  I had a long d/w patient about recent stroke, risk for recurrent stroke/TIAs, personally independently reviewed imaging studies and stroke evaluation results and answered questions.Continue Plavix for secondary stroke prevention and maintain strict control of hypertension with blood pressure goal below 130/90, diabetes with hemoglobin A1c goal below 6.5% and lipids with LDL cholesterol goal below 70 mg/dL. I also advised the patient to eat a healthy diet with plenty of whole grains, cereals, fruits and vegetables, exercise regularly and maintain ideal body weight .Followup in the future if needed.  Cc: Mendel Corning, MD  Sarina Ill, MD  [facility] [address] [place], [place] [address]  Phone [phone] Fax [phone]

## 2017-04-18 NOTE — Patient Instructions (Signed)
Remember to drink plenty of fluid, eat healthy meals and do not skip any meals. Try to eat protein with a every meal and eat a healthy snack such as fruit or nuts in between meals. Try to keep a regular sleep-wake schedule and try to exercise daily, particularly in the form of walking, 20-30 minutes a day, if you can.   As far as your medications are concerned, I would like to suggest: Continue Current Medications  As far as diagnostic testing: Loop recorder recommended  I would like to see you back as needed, sooner if we need to. Please call us with any interim questions, concerns, problems, updates or refill requests.   Our phone number is [phone]. We also have an after hours call service for urgent matters and there is a physician on-call for urgent questions. For any emergencies you know to call 911 or go to the nearest emergency room

## 2018-04-20 IMAGING — CT CT HEAD W/O CM
4 series · 16 of 47 positions shown, 18 images · non-contrast
Comparison: 06/22/2005 head CT.

CLINICAL DATA: Right facial droop.  Difficulty speaking.

EXAM:
CT HEAD WITHOUT CONTRAST
TECHNIQUE: Contiguous axial images were obtained from the base of the skull
through the vertex without intravenous contrast.

[Series 2: head without · axial · non-contrast · 0.47mm/px · z∈[-59,+66]mm · 7 of 35 slices shown, 9 images]
[im 5/35  brain]
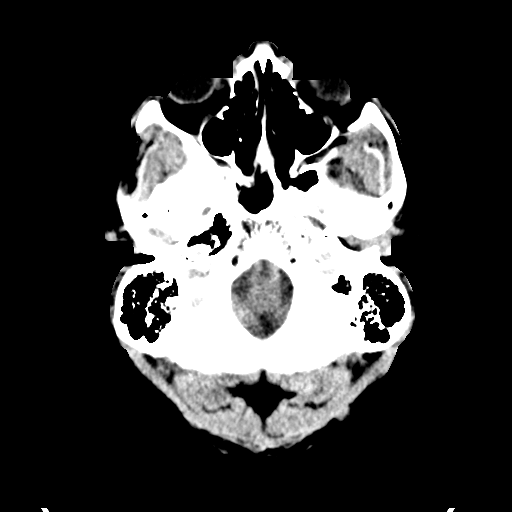
[im 5/35  bone]
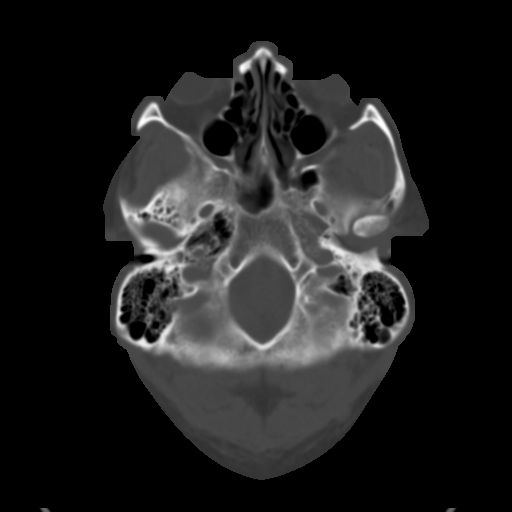
[im 9/35  brain]
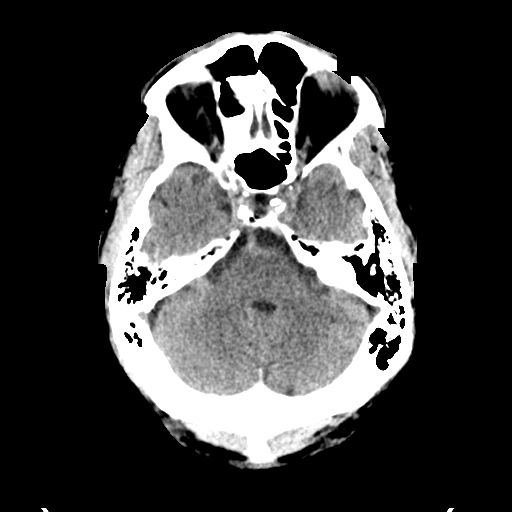
[im 13/35  brain]
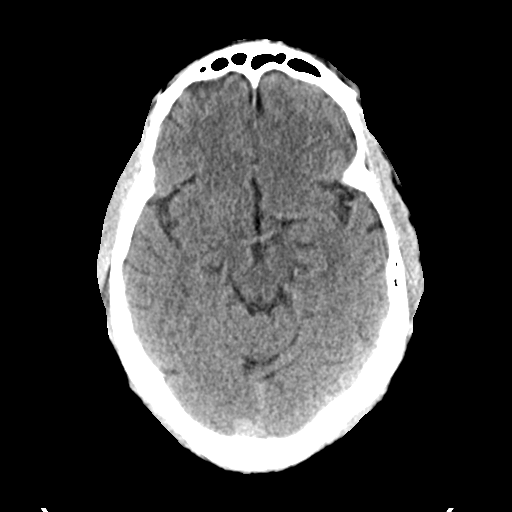
[im 18/35  brain]
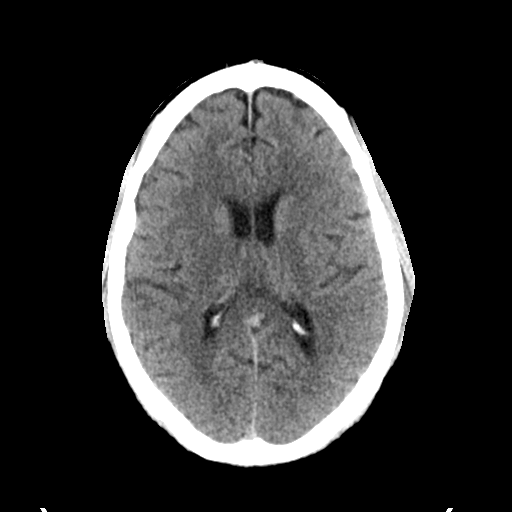
[im 22/35  brain]
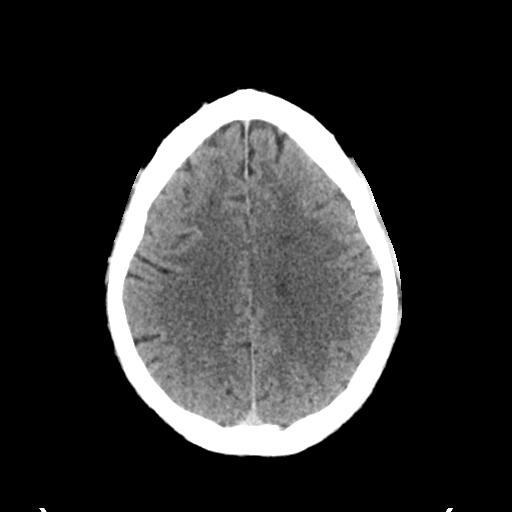
[im 22/35  bone]
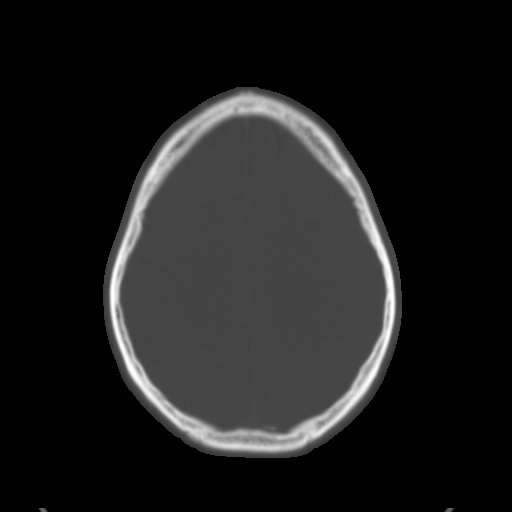
[im 26/35  brain]
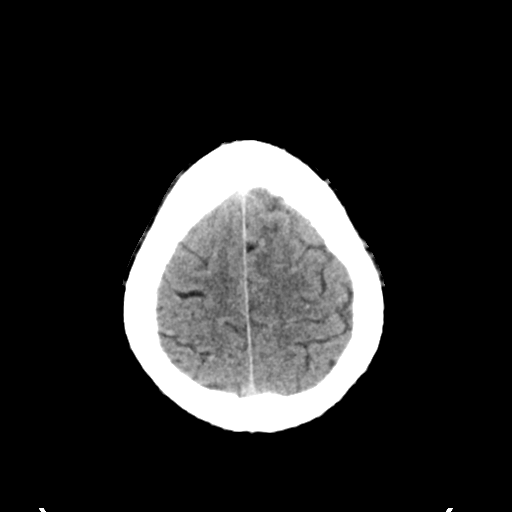
[im 30/35  brain]
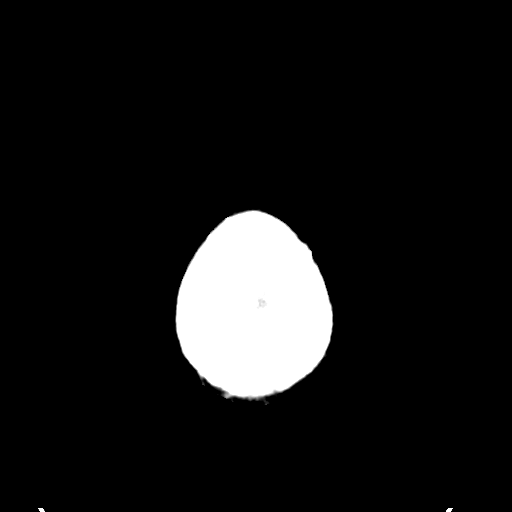

[Series 3: head bone · axial · 0.47mm/px · z∈[-63,-29]mm · 3 of 87 slices shown]
[im 9/87  bone]
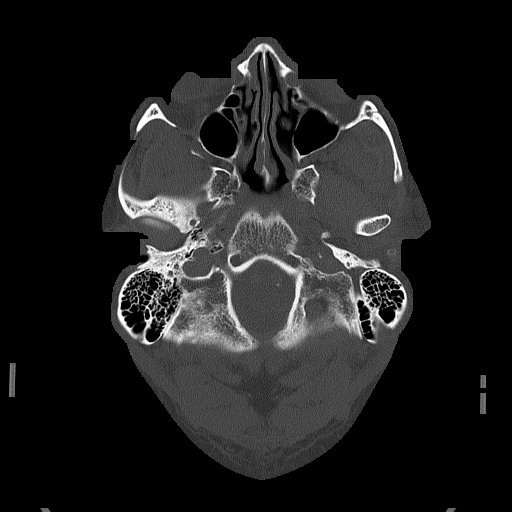
[im 18/87  bone]
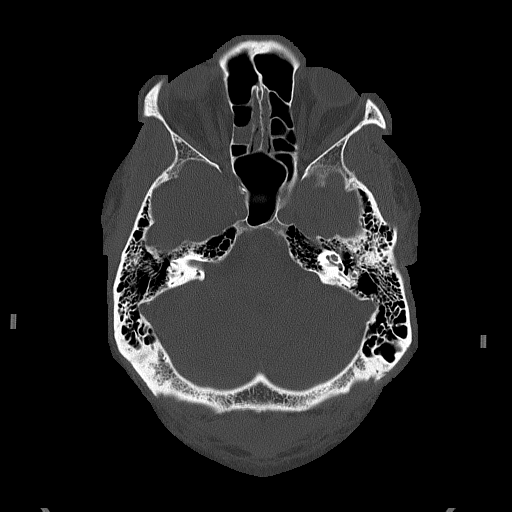
[im 26/87  bone]
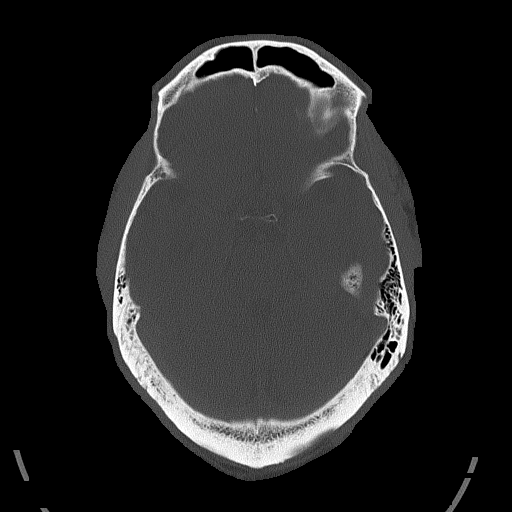

[Series 4: head without cor · coronal · non-contrast · 0.32mm/px · 3 of 70 slices shown]
[im 24/70  brain]
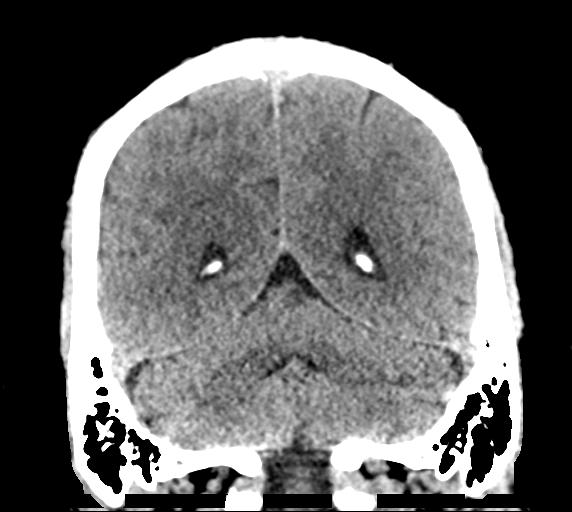
[im 31/70  brain]
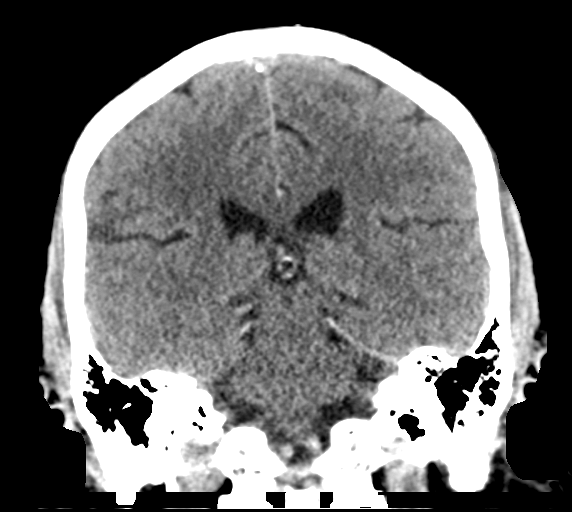
[im 39/70  brain]
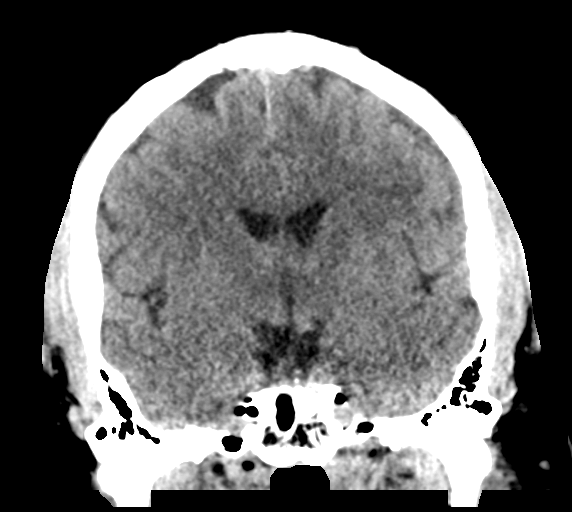

[Series 5: head without sag · sagittal · non-contrast · 0.32mm/px · 3 of 61 slices shown]
[im 21/61  brain]
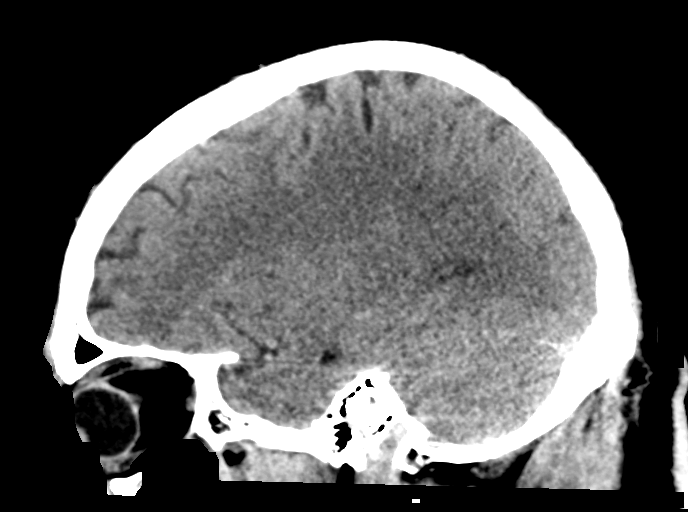
[im 31/61  brain]
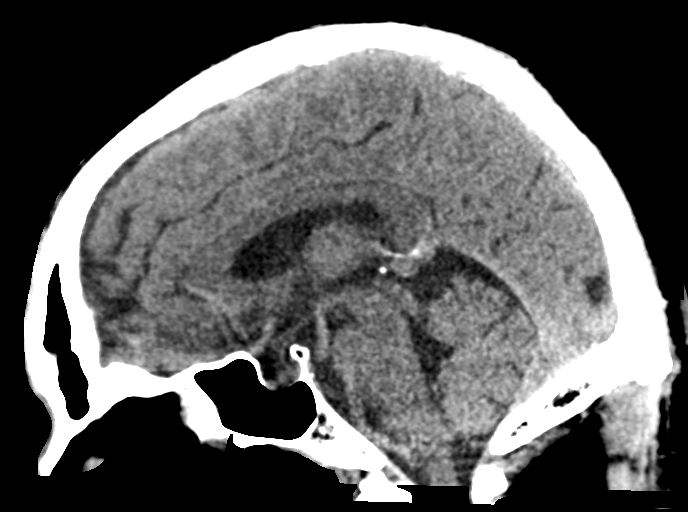
[im 41/61  brain]
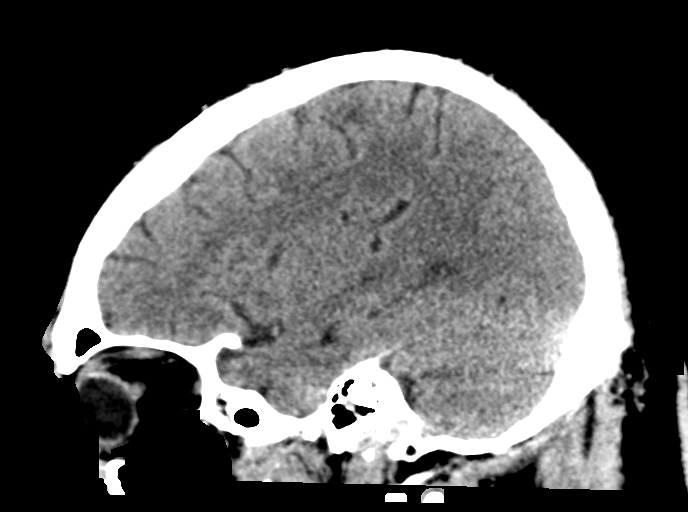

[16 of 47 positions shown; findings below may reference images not displayed]

FINDINGS: Brain: No evidence of parenchymal hemorrhage or extra-axial fluid
collection. No mass lesion, mass effect, or midline shift. No CT
evidence of acute infarction. Nonspecific mild subcortical and
periventricular white matter hypodensity, most in keeping with
chronic small vessel ischemic change. Cerebral volume is age
appropriate. No ventriculomegaly.

Vascular: No hyperdense vessel or unexpected calcification.
Intracranial atherosclerosis.

Skull: No evidence of calvarial fracture.

Sinuses/Orbits: Mild mucoperiosteal thickening in the bilateral
ethmoidal air cells and left maxillary sinus. No fluid levels in the
visualized paranasal sinuses.

Other:  The mastoid air cells are unopacified.
IMPRESSION: 1.  No evidence of acute intracranial abnormality.
2. Mild chronic small vessel ischemia.
3. Mild paranasal sinusitis, probably chronic.

## 2018-05-15 ENCOUNTER — Other Ambulatory Visit (HOSPITAL_BASED_OUTPATIENT_CLINIC_OR_DEPARTMENT_OTHER): Payer: Self-pay

## 2018-05-15 DIAGNOSIS — R0683 Snoring: Secondary | ICD-10-CM

## 2018-05-15 DIAGNOSIS — G4730 Sleep apnea, unspecified: Secondary | ICD-10-CM

## 2018-06-04 ENCOUNTER — Encounter (HOSPITAL_BASED_OUTPATIENT_CLINIC_OR_DEPARTMENT_OTHER): Payer: 59

## 2018-07-01 ENCOUNTER — Other Ambulatory Visit (HOSPITAL_BASED_OUTPATIENT_CLINIC_OR_DEPARTMENT_OTHER): Payer: Self-pay

## 2018-07-01 DIAGNOSIS — G4733 Obstructive sleep apnea (adult) (pediatric): Secondary | ICD-10-CM

## 2018-07-19 ENCOUNTER — Ambulatory Visit (HOSPITAL_BASED_OUTPATIENT_CLINIC_OR_DEPARTMENT_OTHER): Payer: Medicare Other | Attending: Internal Medicine | Admitting: Internal Medicine

## 2018-07-19 VITALS — Ht 65.0 in | Wt 215.0 lb

## 2018-07-19 DIAGNOSIS — G4733 Obstructive sleep apnea (adult) (pediatric): Secondary | ICD-10-CM | POA: Diagnosis present

## 2018-07-19 DIAGNOSIS — Z9989 Dependence on other enabling machines and devices: Secondary | ICD-10-CM

## 2018-08-07 DIAGNOSIS — Z9989 Dependence on other enabling machines and devices: Secondary | ICD-10-CM | POA: Diagnosis not present

## 2018-08-07 DIAGNOSIS — G4733 Obstructive sleep apnea (adult) (pediatric): Secondary | ICD-10-CM | POA: Diagnosis not present

## 2018-08-07 NOTE — Procedures (Signed)
Patient Name: John Finley, Reister Date: 07/19/2018 Gender: Male D.O.B: 07/31/1951 Age (years): 53 Referring Provider: Latanya Presser MD Height (inches): 65 Interpreting Physician: Baird Lyons MD, ABSM Weight (lbs): 215 RPSGT: [place]ug[place], [place] BMI: 36 MRN: [id number] Neck Size: 18.00  CLINICAL INFORMATION Sleep Study Type: Split Night CPAP Indication for sleep study: Hypertension, OSA, Snoring  Epworth Sleepiness Score: 11  SLEEP STUDY TECHNIQUE As per the AASM Manual for the Scoring of Sleep and Associated Events v2.3 (April 2016) with a hypopnea requiring 4% desaturations.  The channels recorded and monitored were frontal, central and occipital EEG, electrooculogram (EOG), submentalis EMG (chin), nasal and oral airflow, thoracic and abdominal wall motion, anterior tibialis EMG, snore microphone, electrocardiogram, and pulse oximetry. Continuous positive airway pressure (CPAP) was initiated when the patient met split night criteria and was titrated according to treat sleep-disordered breathing.  MEDICATIONS Medications self-administered by patient taken the night of the study : none reported  RESPIRATORY PARAMETERS Diagnostic  Total AHI (/hr): 21.1 RDI (/hr): 33.8 OA Index (/hr): 5.4 CA Index (/hr): 0.0 REM AHI (/hr): N/A NREM AHI (/hr): 21.1 Supine AHI (/hr): 30.8 Non-supine AHI (/hr): 9.8 Min O2 Sat (%): 86.0 Mean O2 (%): 92.8 Time below 88% (min): 0.5   Titration  Optimal Pressure (cm): 15 AHI at Optimal Pressure (/hr): 0.0 Min O2 at Optimal Pressure (%): 93.0 Supine % at Optimal (%): 100 Sleep % at Optimal (%): 99   SLEEP ARCHITECTURE The recording time for the entire night was 468.3 minutes.  During a baseline period of 253.1 minutes, the patient slept for 199.1 minutes in REM and nonREM, yielding a sleep efficiency of 78.7%%. Sleep onset after lights out was 5.0 minutes with a REM latency of N/A minutes. The patient spent 22.7%% of the night in stage N1 sleep,  77.3%% in stage N2 sleep, 0.0%% in stage N3 and 0% in REM.  During the titration period of 204.1 minutes, the patient slept for 125.4 minutes in REM and nonREM, yielding a sleep efficiency of 61.4%%. Sleep onset after CPAP initiation was 72.8 minutes with a REM latency of 8.5 minutes. The patient spent 5.6%% of the night in stage N1 sleep, 67.7%% in stage N2 sleep, 0.0%% in stage N3 and 26.7% in REM.  CARDIAC DATA The 2 lead EKG demonstrated sinus rhythm. The mean heart rate was 100.0 beats per minute. Other EKG findings include: PACs.  LEG MOVEMENT DATA The total Periodic Limb Movements of Sleep (PLMS) were 0. The PLMS index was 0.0 .  IMPRESSIONS - Moderate obstructive sleep apnea occurred during the diagnostic portion of the study(AHI = 21.1/hour). An optimal PAP pressure was selected for this patient ( 15 cm of water) - No significant central sleep apnea occurred during the diagnostic portion of the study (CAI = 0.0/hour). - The patient had mild oxygen desaturation during the diagnostic portion of the study (Min O2 = 86.0%) - The patient snored with loud snoring volume during the diagnostic portion of the study. - EKG findings include PACs. - Clinically significant periodic limb movements did not occur during sleep.  DIAGNOSIS - Obstructive Sleep Apnea (327.23 [G47.33 ICD-10])  RECOMMENDATIONS - Trial of CPAP therapy on 15 cm H2O or DME autopap 10-20.  Patient wore a Medium size [employer]ips Respironics Full Face Mask FitLife mask and heated humidification. - Be careful with alcohol, sedatives and other CNS depressants that may worsen sleep apnea and disrupt normal sleep architecture. - Sleep hygiene should be reviewed to assess factors that may improve sleep quality. -  Weight management and regular exercise should be initiated or continued.  [Electronically signed] 08/07/2018 10:31 AM  Baird Lyons MD, ABSM Diplomate, American Board of Sleep Medicine   NPI: [id number]                            [place] [place] [facility]  ELECTRONICALLY SIGNED ON:  08/07/2018, 10:29 AM [place] PH: [phone]   FX: [phone] [place]

## 2018-11-26 DIAGNOSIS — N200 Calculus of kidney: Secondary | ICD-10-CM | POA: Insufficient documentation

## 2018-11-26 DIAGNOSIS — S92009A Unspecified fracture of unspecified calcaneus, initial encounter for closed fracture: Secondary | ICD-10-CM | POA: Insufficient documentation

## 2018-11-26 DIAGNOSIS — I2510 Atherosclerotic heart disease of native coronary artery without angina pectoris: Secondary | ICD-10-CM | POA: Insufficient documentation

## 2018-11-26 DIAGNOSIS — K259 Gastric ulcer, unspecified as acute or chronic, without hemorrhage or perforation: Secondary | ICD-10-CM | POA: Insufficient documentation

## 2018-11-26 DIAGNOSIS — B029 Zoster without complications: Secondary | ICD-10-CM | POA: Insufficient documentation

## 2018-11-26 DIAGNOSIS — R7303 Prediabetes: Secondary | ICD-10-CM | POA: Insufficient documentation

## 2018-11-26 DIAGNOSIS — N400 Enlarged prostate without lower urinary tract symptoms: Secondary | ICD-10-CM | POA: Insufficient documentation

## 2018-11-26 DIAGNOSIS — K635 Polyp of colon: Secondary | ICD-10-CM | POA: Insufficient documentation

## 2018-12-08 DIAGNOSIS — E119 Type 2 diabetes mellitus without complications: Secondary | ICD-10-CM | POA: Insufficient documentation

## 2018-12-08 DIAGNOSIS — E559 Vitamin D deficiency, unspecified: Secondary | ICD-10-CM | POA: Insufficient documentation

## 2018-12-08 DIAGNOSIS — M47812 Spondylosis without myelopathy or radiculopathy, cervical region: Secondary | ICD-10-CM | POA: Insufficient documentation

## 2018-12-08 DIAGNOSIS — E782 Mixed hyperlipidemia: Secondary | ICD-10-CM | POA: Insufficient documentation

## 2018-12-08 DIAGNOSIS — Z8673 Personal history of transient ischemic attack (TIA), and cerebral infarction without residual deficits: Secondary | ICD-10-CM | POA: Insufficient documentation

## 2018-12-08 DIAGNOSIS — N400 Enlarged prostate without lower urinary tract symptoms: Secondary | ICD-10-CM | POA: Insufficient documentation

## 2018-12-08 DIAGNOSIS — M47816 Spondylosis without myelopathy or radiculopathy, lumbar region: Secondary | ICD-10-CM | POA: Insufficient documentation

## 2018-12-08 DIAGNOSIS — G3184 Mild cognitive impairment, so stated: Secondary | ICD-10-CM | POA: Insufficient documentation

## 2018-12-09 ENCOUNTER — Other Ambulatory Visit: Payer: Self-pay | Admitting: Internal Medicine

## 2018-12-09 DIAGNOSIS — Z122 Encounter for screening for malignant neoplasm of respiratory organs: Secondary | ICD-10-CM

## 2019-02-27 ENCOUNTER — Other Ambulatory Visit: Payer: Self-pay | Admitting: Internal Medicine

## 2019-02-27 DIAGNOSIS — Z87891 Personal history of nicotine dependence: Secondary | ICD-10-CM

## 2019-03-06 ENCOUNTER — Ambulatory Visit: Payer: PRIVATE HEALTH INSURANCE

## 2019-03-09 ENCOUNTER — Telehealth: Payer: Self-pay

## 2019-03-09 NOTE — Telephone Encounter (Signed)
Notes on file

## 2019-03-25 DIAGNOSIS — J302 Other seasonal allergic rhinitis: Secondary | ICD-10-CM | POA: Insufficient documentation

## 2019-10-05 ENCOUNTER — Ambulatory Visit
Admission: RE | Admit: 2019-10-05 | Discharge: 2019-10-05 | Disposition: A | Discharge status: Home / Self Care | Payer: Medicare Other | Source: Ambulatory Visit | Attending: Internal Medicine | Admitting: Internal Medicine

## 2019-10-05 DIAGNOSIS — Z87891 Personal history of nicotine dependence: Secondary | ICD-10-CM

## 2019-11-04 DIAGNOSIS — M25551 Pain in right hip: Secondary | ICD-10-CM | POA: Insufficient documentation

## 2019-11-14 DIAGNOSIS — M48061 Spinal stenosis, lumbar region without neurogenic claudication: Secondary | ICD-10-CM | POA: Insufficient documentation

## 2019-12-31 ENCOUNTER — Other Ambulatory Visit: Payer: Self-pay | Admitting: Internal Medicine

## 2019-12-31 DIAGNOSIS — M25551 Pain in right hip: Secondary | ICD-10-CM

## 2019-12-31 DIAGNOSIS — M25552 Pain in left hip: Secondary | ICD-10-CM

## 2019-12-31 DIAGNOSIS — R55 Syncope and collapse: Secondary | ICD-10-CM

## 2020-02-03 ENCOUNTER — Emergency Department (HOSPITAL_COMMUNITY): Payer: Medicare Other

## 2020-02-03 ENCOUNTER — Other Ambulatory Visit: Payer: Self-pay

## 2020-02-03 ENCOUNTER — Encounter (HOSPITAL_COMMUNITY): Payer: Self-pay | Admitting: Emergency Medicine

## 2020-02-03 ENCOUNTER — Emergency Department (HOSPITAL_COMMUNITY)
Admission: EM | Admit: 2020-02-03 | Discharge: 2020-02-03 | Disposition: A | Discharge status: Home / Self Care | Payer: Medicare Other | Attending: Emergency Medicine | Admitting: Emergency Medicine

## 2020-02-03 DIAGNOSIS — Z7902 Long term (current) use of antithrombotics/antiplatelets: Secondary | ICD-10-CM | POA: Insufficient documentation

## 2020-02-03 DIAGNOSIS — Y929 Unspecified place or not applicable: Secondary | ICD-10-CM | POA: Insufficient documentation

## 2020-02-03 DIAGNOSIS — W06XXXA Fall from bed, initial encounter: Secondary | ICD-10-CM | POA: Insufficient documentation

## 2020-02-03 DIAGNOSIS — R1011 Right upper quadrant pain: Secondary | ICD-10-CM | POA: Diagnosis not present

## 2020-02-03 DIAGNOSIS — Z7984 Long term (current) use of oral hypoglycemic drugs: Secondary | ICD-10-CM | POA: Diagnosis not present

## 2020-02-03 DIAGNOSIS — Z79899 Other long term (current) drug therapy: Secondary | ICD-10-CM | POA: Diagnosis not present

## 2020-02-03 DIAGNOSIS — Y999 Unspecified external cause status: Secondary | ICD-10-CM | POA: Diagnosis not present

## 2020-02-03 DIAGNOSIS — S299XXA Unspecified injury of thorax, initial encounter: Secondary | ICD-10-CM | POA: Diagnosis present

## 2020-02-03 DIAGNOSIS — I10 Essential (primary) hypertension: Secondary | ICD-10-CM | POA: Diagnosis not present

## 2020-02-03 DIAGNOSIS — Z8673 Personal history of transient ischemic attack (TIA), and cerebral infarction without residual deficits: Secondary | ICD-10-CM | POA: Diagnosis not present

## 2020-02-03 DIAGNOSIS — S2231XA Fracture of one rib, right side, initial encounter for closed fracture: Secondary | ICD-10-CM | POA: Insufficient documentation

## 2020-02-03 DIAGNOSIS — Y939 Activity, unspecified: Secondary | ICD-10-CM | POA: Insufficient documentation

## 2020-02-03 LAB — CBC WITH DIFFERENTIAL/PLATELET
Abs Immature Granulocytes: 0.01 10*3/uL (ref 0.00–0.07)
Basophils Absolute: 0 10*3/uL (ref 0.0–0.1)
Basophils Relative: 1 %
Eosinophils Absolute: 0.2 10*3/uL (ref 0.0–0.5)
Eosinophils Relative: 3 %
HCT: 44.5 % (ref 39.0–52.0)
Hemoglobin: 15 g/dL (ref 13.0–17.0)
Immature Granulocytes: 0 %
Lymphocytes Relative: 33 %
Lymphs Abs: 2.6 10*3/uL (ref 0.7–4.0)
MCH: 30.1 pg (ref 26.0–34.0)
MCHC: 33.7 g/dL (ref 30.0–36.0)
MCV: 89.2 fL (ref 80.0–100.0)
Monocytes Absolute: 0.6 10*3/uL (ref 0.1–1.0)
Monocytes Relative: 8 %
Neutro Abs: 4.5 10*3/uL (ref 1.7–7.7)
Neutrophils Relative %: 55 %
Platelets: 271 10*3/uL (ref 150–400)
RBC: 4.99 MIL/uL (ref 4.22–5.81)
RDW: 14.2 % (ref 11.5–15.5)
WBC: 7.9 10*3/uL (ref 4.0–10.5)
nRBC: 0 % (ref 0.0–0.2)

## 2020-02-03 LAB — URINALYSIS, ROUTINE W REFLEX MICROSCOPIC
Bilirubin Urine: NEGATIVE
Glucose, UA: NEGATIVE mg/dL
Hgb urine dipstick: NEGATIVE
Ketones, ur: NEGATIVE mg/dL
Leukocytes,Ua: NEGATIVE
Nitrite: NEGATIVE
Protein, ur: NEGATIVE mg/dL
Specific Gravity, Urine: 1.016 (ref 1.005–1.030)
pH: 7 (ref 5.0–8.0)

## 2020-02-03 LAB — COMPREHENSIVE METABOLIC PANEL
ALT: 26 U/L (ref 0–44)
AST: 27 U/L (ref 15–41)
Albumin: 4.8 g/dL (ref 3.5–5.0)
Alkaline Phosphatase: 68 U/L (ref 38–126)
Anion gap: 9 (ref 5–15)
BUN: 13 mg/dL (ref 8–23)
CO2: 27 mmol/L (ref 22–32)
Calcium: 9.7 mg/dL (ref 8.9–10.3)
Chloride: 102 mmol/L (ref 98–111)
Creatinine, Ser: 1.11 mg/dL (ref 0.61–1.24)
GFR calc Af Amer: >60 mL/min (ref >60)
GFR calc non Af Amer: >60 mL/min (ref >60)
Glucose, Bld: 112 mg/dL — ABNORMAL HIGH (ref 70–99)
Potassium: 3.9 mmol/L (ref 3.5–5.1)
Sodium: 138 mmol/L (ref 135–145)
Total Bilirubin: 1.1 mg/dL (ref 0.3–1.2)
Total Protein: 8.3 g/dL — ABNORMAL HIGH (ref 6.5–8.1)

## 2020-02-03 LAB — LIPASE, BLOOD: Lipase: 19 U/L (ref 11–51)

## 2020-02-03 MED ORDER — IOHEXOL 300 MG/ML  SOLN
100.0000 mL | Freq: Once | INTRAMUSCULAR | Status: AC | PRN
  Administered 2020-02-03: 100 mL via INTRAVENOUS

## 2020-02-03 NOTE — ED Notes (Signed)
Advised pt urine sample needed and provided with urinal.  Will check back for sample.

## 2020-02-03 NOTE — ED Triage Notes (Addendum)
Patient arrives with C/C of abd pain that at around 9 am this morning. He had a fall 3 days ago off of the bed, is on blood thinners. Denies N/V/D, fevers. Denies SOB or chest pain. Normal BM this AM, did not try anything for the pain. States the pain gets worse when he sits up or uses his abd muscles.

## 2020-02-03 NOTE — Discharge Instructions (Signed)
Continue to take all of your home medications as prescribed.  You can take 1 to 2 tablets of extra strength Tylenol every 6 hours as needed for pain.  Do not exceed more than 4000 mg of Tylenol daily.  Use the incentive spirometer as instructed by the respiratory therapist to reduce the risk of developing pneumonia.  Follow-up with your primary care provider for reevaluation of your symptoms.  Return to the emergency department if any concerning signs or symptoms develop such as fevers, severe shortness of breath or cough, abdominal pains with vomiting.

## 2020-02-03 NOTE — ED Provider Notes (Signed)
[facility] DEPT Provider Note   CSN: [id number] Arrival date & time: 02/03/20  1618     History Chief Complaint  Patient presents with   Abdominal Pain    DRYDEN JARROW is a 69 y.o. male with history of gastric ulcer, hyperlipidemia, hypertension, CVA, OSA presents for evaluation of acute onset, progressively worsening right-sided pains secondary to fall out of bed 3 days ago.  He reports he fell from a height of around 2 to 3 feet, did not hit his head or lose consciousness.  He is unsure of how he landed but he does not think that he landed on his abdomen.  Shortly after the fall he developed intermittent cramping pains to the right chest wall/right upper quadrant of the abdomen which was reproducible on palpation and worsened with cough, attempt to have a bowel movement, or other actions that increase intra-abdominal pressure.  He denies shortness of breath, nausea, vomiting, urinary symptoms, hematuria, melena or hematochezia.  He did have a softer bowel movement today but otherwise bowel movements have been normal.  Over the last 2 days he has had progressively worsening right upper quadrant abdominal pain which is now constant but worsens with certain movements, cough.  He has not tried anything for his symptoms.  He is concerned because he is on Plavix.  He reports compliance with his medications.  The history is provided by the patient.       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   Cerebrovascular accident (CVA) ([place])    OSA on CPAP    Suspected cerebrovascular accident (CVA) 02/16/2017   Hypertension 02/16/2017   Hypertensive urgency 02/16/2017    Past  Surgical History:  Procedure Laterality Date   ANKLE SURGERY     HERNIA REPAIR     TEE WITHOUT CARDIOVERSION N/A 02/19/2017   Procedure: TRANSESOPHAGEAL ECHOCARDIOGRAM (TEE);  Surgeon: Dorothy Spark, MD;  Location: [facility] ENDOSCOPY;  Service: Cardiovascular;  Laterality: N/A;       Family History  Problem Relation Age of Onset   Cancer - Other Mother    Diabetes Mother    Stroke Mother    Heart disease Mother    Other Father        Accident   Hypertension Sister    Diabetes Brother    Stroke Brother     Social History   Tobacco Use   Smoking status: Never Smoker   Smokeless tobacco: Never Used  Substance Use Topics   Alcohol use: No    Comment: Rare   Drug use: No    Home Medications Prior to Admission medications   Medication Sig Start Date End Date Taking? Authorizing Provider  acyclovir (ZOVIRAX) 400 MG tablet Take 400 mg by mouth 2 (two) times daily. 01/01/20  Yes [provider]  amLODipine (NORVASC) 10 MG tablet Take 10 mg by mouth daily.  01/11/20  Yes [provider]  clopidogrel (PLAVIX) 75 MG tablet Take 1 tablet (75 mg total) by mouth daily. 02/19/17  Yes Rai, Ripudeep K, MD  hydrochlorothiazide (MICROZIDE) 12.5 MG capsule Take 12.5 mg by mouth daily.   Yes  [provider]  irbesartan (AVAPRO) 300 MG tablet Take 300 mg by mouth daily. 12/03/19  Yes [provider]  metFORMIN (GLUCOPHAGE-XR) 500 MG 24 hr tablet Take 500 mg by mouth daily. 11/27/19  Yes [provider]  rosuvastatin (CRESTOR) 20 MG tablet Take 20 mg by mouth daily.  01/11/20  Yes [provider]  atorvastatin (LIPITOR) 40 MG tablet Take 1 tablet (40 mg total) by mouth at bedtime. Patient not taking: Reported on 02/03/2020 02/19/17   Mendel Corning, MD    Allergies    Penicillins and Sulfa antibiotics  Review of Systems   Review of Systems  Constitutional: Negative for chills and fever.  Respiratory: Negative for shortness of  breath.   Cardiovascular: Positive for chest pain (chest wall pain).  Gastrointestinal: Positive for abdominal pain. Negative for diarrhea, nausea and vomiting.  Genitourinary: Negative for dysuria, frequency, hematuria and urgency.  Neurological: Negative for syncope and headaches.  All other systems reviewed and are negative.   Physical Exam Updated Vital Signs BP (!) 161/99 (BP Location: Left Arm)    Pulse 89    Temp 98.6 F (37 C) (Oral)    Resp 16    SpO2 97%   Physical Exam Vitals and nursing note reviewed.  Constitutional:      General: He is not in acute distress.    Appearance: He is well-developed.  HENT:     Head: Normocephalic and atraumatic.     Comments: No Battle's signs, no raccoon's eyes, no rhinorrhea. No tenderness to palpation of the face or skull. No deformity, crepitus, or swelling noted.  Eyes:     General:        Right eye: No discharge.        Left eye: No discharge.     Conjunctiva/sclera: Conjunctivae normal.  Neck:     Vascular: No JVD.     Trachea: No tracheal deviation.  Cardiovascular:     Rate and Rhythm: Normal rate and regular rhythm.  Pulmonary:     Effort: Pulmonary effort is normal.     Breath sounds: Normal breath sounds.  Chest:     Chest wall: Tenderness present.       Comments: Focal right lateral chest wall tenderness with no deformity, crepitus, ecchymosis or flail segment Abdominal:     General: Abdomen is protuberant. Bowel sounds are normal. There is no distension.     Palpations: Abdomen is soft.     Tenderness: There is abdominal tenderness in the right upper quadrant. There is no right CVA tenderness, left CVA tenderness, guarding or rebound.  Skin:    General: Skin is warm and dry.     Findings: No erythema.  Neurological:     Mental Status: He is alert.  Psychiatric:        Behavior: Behavior normal.     ED Results / Procedures / Treatments   Labs (all labs ordered are listed, but only abnormal results are  displayed) Labs Reviewed  COMPREHENSIVE METABOLIC PANEL - Abnormal; Notable for the following components:      Result Value   Glucose, Bld 112 (*)    Total Protein 8.3 (*)    All other components within normal limits  CBC WITH DIFFERENTIAL/PLATELET  LIPASE, BLOOD  URINALYSIS, ROUTINE W REFLEX MICROSCOPIC    EKG None  Radiology  DG Ribs Unilateral W/Chest Right  Result Date: 02/03/2020 CLINICAL DATA:  Fall with rib pain EXAM: RIGHT RIBS AND CHEST - 3+ VIEW COMPARISON:  02/17/2017 chest x-ray FINDINGS: Single-view chest demonstrates no focal opacity or pleural effusion. Stable cardiomediastinal silhouette. No pneumothorax. Right rib series demonstrates probable acute nondisplaced right tenth anterolateral rib fracture. Old appearing fracture deformity of the right ninth rib. IMPRESSION: 1. Negative for pneumothorax or pleural effusion 2. Suspected acute right anterolateral tenth rib fracture Electronically Signed   By: Donavan Foil M.D.   On: 02/03/2020 17:31   CT ABDOMEN PELVIS W CONTRAST  Result Date: 02/03/2020 CLINICAL DATA:  69 year old male with left upper quadrant abdominal pain since 0900 hours. Recent fall, on blood thinners. EXAM: CT ABDOMEN AND PELVIS WITH CONTRAST TECHNIQUE: Multidetector CT imaging of the abdomen and pelvis was performed using the standard protocol following bolus administration of intravenous contrast. CONTRAST:  185mL OMNIPAQUE IOHEXOL 300 MG/ML  SOLN COMPARISON:  Chest CT 10/05/2019. FINDINGS: Lower chest: Stable borderline to mild cardiomegaly. No pericardial or pleural effusion. Mild subpleural scarring in the left lingula and costophrenic angle appears stable since October. Negative lung bases otherwise. Hepatobiliary: Numerous scattered small low-density areas in the liver. The largest of these (up to 17 mm diameter) have simple fluid density, and all appear circumscribed and probably benign. Negative gallbladder. No bile duct enlargement. Pancreas:  Negative. Spleen: Negative. Adrenals/Urinary Tract: Normal adrenal glands. No nephrolithiasis. Symmetric renal enhancement and contrast excretion with no hydronephrosis. Occasional tiny renal cysts. Normal proximal ureters. The left ureter appears normal to the bladder. The bladder is decompressed and unremarkable. Stomach/Bowel: Negative rectum. Redundant sigmoid colon with diverticulosis. Diverticulosis continues through the descending colon and transverse colon. There is also some right colon diverticulosis. No active inflammation identified. The cecum is on a lax mesentery. Normal appendix on series 2, image 41. Negative terminal ileum. No dilated small bowel. Decompressed stomach. No free air or free fluid. No mesenteric stranding identified. Vascular/Lymphatic: Extensive Aortoiliac calcified atherosclerosis. Major arterial structures remain patent. Portal venous system is patent. No lymphadenopathy. Reproductive: Small fat containing left inguinal hernia. Other: No pelvic free fluid. Musculoskeletal: No lower rib fracture identified. Advanced lumbar disc and endplate degeneration. Multilevel lumbar spinal stenosis. No acute osseous abnormality identified. IMPRESSION: 1. No acute or inflammatory process identified in the abdomen or pelvis. No recent injury identified. 2. Extensive diverticulosis of the large bowel but no active inflammation identified. Normal appendix. 3. Numerous small low-density areas in the liver are probably benign cysts. 4. Advanced lumbar spine degeneration with multilevel lumbar spinal stenosis. 5. Aortic Atherosclerosis (ICD10-I70.0). Electronically Signed   By: Genevie Ann M.D.   On: 02/03/2020 18:50     Procedures Procedures (including critical care time)  Medications Ordered in ED Medications  iohexol (OMNIPAQUE) 300 MG/ML solution 100 mL (100 mLs Intravenous Contrast Given 02/03/20 1811)    ED Course  I have reviewed the triage vital signs and the nursing  notes.  Pertinent labs & imaging results that were available during my care of the patient were reviewed by me and considered in my medical decision making (see chart for details).    MDM Rules/Calculators/A&P                      Patient presenting for evaluation of right-sided pains several days after a fall.  He is afebrile, mildly hypertensive.  Vital signs otherwise stable.  His pain is reproducible on palpation of a focal area of the right chest wall.  Lab work reviewed and interpreted by myself shows no leukocytosis, no anemia, no metabolic derangements, no renal insufficiency.  LFTs are within normal limits  which is reassuring.  Imaging today shows no evidence of liver laceration or other serious injury but does show possible acute right anterior lateral 10th rib fracture which corresponds to the area of tenderness on examination.  We will treat this as a presumed rib fracture and sent home with incentive spirometer.  He refuses narcotic pain medicines which I think is reasonable as he is generally resting comfortably.  No evidence of any serious injury otherwise.  No head injury or loss of consciousness, no signs of head trauma on examination today.  Recommend follow-up with PCP for reevaluation of symptoms.  Discussed strict ED return precautions. Patient verbalized understanding of and agreement with plan and is safe for discharge home at this time. Patient seen and evaluated by Dr. Tyrone Nine agrees with assessment and plan at this time. Final Clinical Impression(s) / ED Diagnoses Final diagnoses:  Fall from bed, initial encounter  Closed fracture of one rib of right side, initial encounter  Hypertension, unspecified type    Rx / DC Orders ED Discharge Orders    None       Renita Papa, PA-C 02/08/20 Tom Green, DO 02/08/20 1552

## 2020-02-03 NOTE — ED Notes (Signed)
Patient was verbalized discharge instructions. Pt had no further questions at this time. NAD. 

## 2020-02-07 NOTE — ED Provider Notes (Signed)
Medical screening examination/treatment/procedure(s) were conducted as a shared visit with non-physician practitioner(s) and myself.  I personally evaluated the patient during the encounter.      See the written copy of this report in the patient's paper medical record.  These results did not interface directly into the electronic medical record and are summarized here.  69 yo M here after a fall out of bed.  Complaining of right upper quadrant abdominal pain.  Symptoms not acute in onset but worsening over the past couple days.  Chest x-ray without obvious fracture as viewed by me.  CT scan also without intra-abdominal pathology.  Patient does have focal tenderness and significant amounts of pain along the rib margin suspect fracture.  Treat as such.  PCP follow-up.   Deno Etienne, DO 02/08/20 1552

## 2020-06-10 ENCOUNTER — Encounter: Payer: Self-pay | Admitting: Radiation Oncology

## 2020-09-28 ENCOUNTER — Other Ambulatory Visit: Payer: Self-pay | Admitting: Internal Medicine

## 2020-09-28 DIAGNOSIS — F0390 Unspecified dementia without behavioral disturbance: Secondary | ICD-10-CM

## 2020-09-29 ENCOUNTER — Other Ambulatory Visit: Payer: Self-pay | Admitting: Internal Medicine

## 2020-09-29 DIAGNOSIS — R55 Syncope and collapse: Secondary | ICD-10-CM

## 2020-10-07 ENCOUNTER — Ambulatory Visit
Admission: RE | Admit: 2020-10-07 | Discharge: 2020-10-07 | Disposition: A | Discharge status: Home / Self Care | Payer: Medicare Other | Source: Ambulatory Visit | Attending: Internal Medicine | Admitting: Internal Medicine

## 2020-10-07 ENCOUNTER — Other Ambulatory Visit: Payer: Self-pay | Admitting: Internal Medicine

## 2020-10-07 DIAGNOSIS — M542 Cervicalgia: Secondary | ICD-10-CM

## 2020-10-07 DIAGNOSIS — R55 Syncope and collapse: Secondary | ICD-10-CM

## 2020-10-07 DIAGNOSIS — M25551 Pain in right hip: Secondary | ICD-10-CM

## 2020-10-27 ENCOUNTER — Other Ambulatory Visit: Payer: Self-pay | Admitting: Internal Medicine

## 2020-10-27 DIAGNOSIS — Z122 Encounter for screening for malignant neoplasm of respiratory organs: Secondary | ICD-10-CM

## 2020-11-16 ENCOUNTER — Ambulatory Visit
Admission: RE | Admit: 2020-11-16 | Discharge: 2020-11-16 | Disposition: A | Discharge status: Home / Self Care | Payer: Medicare Other | Source: Ambulatory Visit | Attending: Internal Medicine | Admitting: Internal Medicine

## 2020-11-16 DIAGNOSIS — Z122 Encounter for screening for malignant neoplasm of respiratory organs: Secondary | ICD-10-CM

## 2020-11-22 DIAGNOSIS — J439 Emphysema, unspecified: Secondary | ICD-10-CM | POA: Insufficient documentation

## 2020-11-22 DIAGNOSIS — R911 Solitary pulmonary nodule: Secondary | ICD-10-CM | POA: Insufficient documentation

## 2020-11-25 ENCOUNTER — Telehealth: Payer: Self-pay | Admitting: *Deleted

## 2020-11-25 NOTE — Telephone Encounter (Signed)
Notes on file from [facility] [phone]. Referral sent to scheduling.

## 2020-11-29 ENCOUNTER — Ambulatory Visit (INDEPENDENT_AMBULATORY_CARE_PROVIDER_SITE_OTHER): Payer: Medicare Other | Admitting: Interventional Cardiology

## 2020-11-29 ENCOUNTER — Other Ambulatory Visit: Payer: Self-pay

## 2020-11-29 ENCOUNTER — Encounter: Payer: Self-pay | Admitting: Interventional Cardiology

## 2020-11-29 ENCOUNTER — Other Ambulatory Visit: Payer: Self-pay | Admitting: Interventional Cardiology

## 2020-11-29 VITALS — BP 148/82 | HR 79 | Ht 65.0 in | Wt 228.2 lb

## 2020-11-29 DIAGNOSIS — R011 Cardiac murmur, unspecified: Secondary | ICD-10-CM

## 2020-11-29 DIAGNOSIS — I10 Essential (primary) hypertension: Secondary | ICD-10-CM

## 2020-11-29 DIAGNOSIS — I2510 Atherosclerotic heart disease of native coronary artery without angina pectoris: Secondary | ICD-10-CM | POA: Diagnosis not present

## 2020-11-29 DIAGNOSIS — I639 Cerebral infarction, unspecified: Secondary | ICD-10-CM

## 2020-11-29 DIAGNOSIS — I517 Cardiomegaly: Secondary | ICD-10-CM

## 2020-11-29 DIAGNOSIS — Z7189 Other specified counseling: Secondary | ICD-10-CM

## 2020-11-29 DIAGNOSIS — G4733 Obstructive sleep apnea (adult) (pediatric): Secondary | ICD-10-CM

## 2020-11-29 DIAGNOSIS — I700 Atherosclerosis of aorta: Secondary | ICD-10-CM

## 2020-11-29 DIAGNOSIS — Z794 Long term (current) use of insulin: Secondary | ICD-10-CM

## 2020-11-29 DIAGNOSIS — E118 Type 2 diabetes mellitus with unspecified complications: Secondary | ICD-10-CM

## 2020-11-29 MED ORDER — EPLERENONE 25 MG PO TABS: 25.0000 mg | ORAL_TABLET | ORAL | 3 refills | Status: DC

## 2020-11-29 NOTE — Patient Instructions (Addendum)
Medication Instructions:  1) DISCONTINUE Hydrochlorothiazide 2) START Epleronene 25mg  once daily on Monday, Wednesday and Friday.  *If you need a refill on your cardiac medications before your next appointment, please call your pharmacy*   Lab Work: BMET in 7-10 days  If you have labs (blood work) drawn today and your tests are completely normal, you will receive your results only by: Marland Kitchen MyChart Message (if you have MyChart) OR . A paper copy in the mail If you have any lab test that is abnormal or we need to change your treatment, we will call you to review the results.   Testing/Procedures: Your physician has requested that you have an echocardiogram. Echocardiography is a painless test that uses sound waves to create images of your heart. It provides your doctor with information about the size and shape of your heart and how well your heart's chambers and valves are working. This procedure takes approximately one hour. There are no restrictions for this procedure.   Follow-Up: At [facility], you and your health needs are our priority.  As part of our continuing mission to provide you with exceptional heart care, we have created designated Provider Care Teams.  These Care Teams include your primary Cardiologist (physician) and Advanced Practice Providers (APPs -  Physician Assistants and Nurse Practitioners) who all work together to provide you with the care you need, when you need it.  We recommend signing up for the patient portal called "MyChart".  Sign up information is provided on this After Visit Summary.  MyChart is used to connect with patients for Virtual Visits (Telemedicine).  Patients are able to view lab/test results, encounter notes, upcoming appointments, etc.  Non-urgent messages can be sent to your provider as well.   To learn more about what you can do with MyChart, go to [email or web address].    Your next appointment:   4-6 week(s)- can have 01/09/21 at 10:20am or  01/11/21 at 2pm.  The format for your next appointment:   In Person  Provider:   You may see Dr. Daneen Schick or one of the following Advanced Practice Providers on your designated Care Team:    Truitt Merle, NP  Cecilie Kicks, NP  Kathyrn Drown, NP    Other Instructions  Your provider recommends that you maintain 150 minutes per week of moderate aerobic activity.

## 2020-11-29 NOTE — Progress Notes (Signed)
Cardiology Office Note:    Date:  11/29/2020   ID:  INMAN FETTIG, DOB 1951-01-31, MRN [id number]  PCP:  Audley Hose, MD  Cardiologist:  No primary care provider on file.   Referring MD: Audley Hose, MD   Chief Complaint  Patient presents with  . Coronary Artery Disease  . Hyperlipidemia  . Hypertension  . Diabetes Mellitus    History of Present Illness:    John Finley is a 69 y.o. 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  He is referred by Dr. Maia Petties because of coronary and aortic atherosclerosis noted on recent CT that was being done to rule out lung cancer given the prior history of smoking.  COPD was noted along with atherosclerosis.  He has a longstanding history of atherosclerosis with heart catheterization performed in 2006 by Dr. Einar Gip.  He had nonobstructive disease at that time.  LV function was hyper dynamic.  Subsequent stroke work-up in 2018 demonstrated intracranial atherosclerosis as well as subclavian and carotid atherosclerosis.  No recurrent neurological symptoms.  Source of CVA was not identified.  A. fib was not identified on long-term monitoring.  A loop recorder was advised but not performed.  Blood pressure control has not been pristine.  Has been running in the 1 12-7 40 systolic range since he gained weight.  States the most recent hemoglobin A1c was 7.7.  He is on statin therapy.  He is on Metformin for diabetes.  Past Medical History:  Diagnosis Date  . Arrhythmia   . Gastric ulcer   . Herniated cervical disc   . High cholesterol   . Hypertension   . Prediabetes   . Sleep apnea   . Stroke (cerebrum) [facility])     Past Surgical History:  Procedure Laterality Date  . ANKLE SURGERY    . HERNIA REPAIR    . TEE WITHOUT  CARDIOVERSION N/A 02/19/2017   Procedure: TRANSESOPHAGEAL ECHOCARDIOGRAM (TEE);  Surgeon: Dorothy Spark, MD;  Location: [facility] ENDOSCOPY;  Service: Cardiovascular;  Laterality: N/A;    Current Medications: Current Meds  Medication Sig  . acyclovir (ZOVIRAX) 400 MG tablet Take 400 mg by mouth 2 (two) times daily.  Marland Kitchen amLODipine (NORVASC) 10 MG tablet Take 10 mg by mouth daily.   Marland Kitchen atorvastatin (LIPITOR) 40 MG tablet Take 1 tablet (40 mg total) by mouth at bedtime.  . clopidogrel (PLAVIX) 75 MG tablet Take 1 tablet (75 mg total) by mouth daily.  . irbesartan (AVAPRO) 300 MG tablet Take 300 mg by mouth daily.  . metFORMIN (GLUCOPHAGE-XR) 500 MG 24 hr tablet Take 500 mg by mouth daily.  . rosuvastatin (CRESTOR) 20 MG tablet Take 20 mg by mouth daily.   . tadalafil (CIALIS) 5 MG tablet Take 5 mg by mouth daily.  . Vitamin D, Ergocalciferol, (DRISDOL) 1.25 MG (50000 UNIT) CAPS capsule Take 50,000 Units by mouth once a week.  . [DISCONTINUED] hydrochlorothiazide (MICROZIDE) 12.5 MG capsule Take 12.5 mg by mouth daily.     Allergies:   Penicillins and Sulfa antibiotics   Social History   Socioeconomic History  . Marital status: Married    Spouse name: Not on file  . Number of children: 2  . Years of education: MD  . Highest education level: Not on file  Occupational History  .  Occupation: Film/video editor  Tobacco Use  . Smoking status: Never Smoker  . Smokeless tobacco: Never Used  Substance and Sexual Activity  . Alcohol use: No    Comment: Rare  . Drug use: No  . Sexual activity: Not on file  Other Topics Concern  . Not on file  Social History Narrative   Lives at home w/ his wife   Right-handed   Caffeine: 1 cup coffee per day   Social Determinants of Health   Financial Resource Strain: Not on file  Food Insecurity: Not on file  Transportation Needs: Not on file  Physical Activity: Not on file  Stress: Not on file  Social Connections: Not on file     Family History: The  patient's family history includes Cancer - Other in his mother; Diabetes in his brother and mother; Heart disease in his mother; Hypertension in his sister; Other in his father; Stroke in his brother and mother.  ROS:   Please see the history of present illness.    Sleeps well.  No new neurological symptoms.  No orthopnea, PND, but is sedentary.  He has recurring episodes of dizziness.  He calls it "[place]'s disease" he is compliant with CPAP.Marland Kitchen  All other systems reviewed and are negative.  EKGs/Labs/Other Studies Reviewed:    The following studies were reviewed today: No new imaging data. 2D Doppler echocardiogram March 2018: Study Conclusions   - Left ventricle: The cavity size was normal. Wall thickness was  increased increased in a pattern of mild to moderate LVH.  Systolic function was normal. The estimated ejection fraction was  in the range of 55% to 60%. Wall motion was normal; there were no  regional wall motion abnormalities.  - Aortic valve: Trileaflet; normal thickness, mildly calcified  leaflets.   Impressions:   - No cardiac source of emboli was indentified.   Transesophageal echo March 2018: Study Conclusions   - Left ventricle: The cavity size was normal. Wall thickness was  increased in a pattern of moderate LVH. Systolic function was  vigorous. The estimated ejection fraction was in the range of 65%  to 70%. Wall motion was normal; there were no regional wall  motion abnormalities.  - Aortic valve: No evidence of vegetation.  - Mitral valve: There was mild regurgitation.  - Left atrium: The atrium was dilated. No evidence of thrombus in  the atrial cavity or appendage. No evidence of thrombus in the  atrial cavity or appendage. The appendage was morphologically a  left appendage, multilobulated, and of normal size. Emptying  velocity was normal.  - Right atrium: No evidence of thrombus in the atrial cavity or  appendage.  -  Atrial septum: No defect or patent foramen ovale was identified.  - Pulmonic valve: No evidence of vegetation.   Impressions:   - No intracardiac source of embolism was identified. No thrombus in  the left atrium or left atrial appendage. Negative bubble study.   EKG:  EKG normal sinus rhythm, first-degree AV block, inferolateral T wave inversion.  Recent Labs: 02/03/2020: ALT 26; BUN 13; Creatinine, Ser 1.11; Hemoglobin 15.0; Platelets 271; Potassium 3.9; Sodium 138  Recent Lipid Panel    Component Value Date/Time   CHOL 178 02/17/2017 0444   TRIG 104 02/17/2017 0444   HDL 32 (L) 02/17/2017 0444   CHOLHDL 5.6 02/17/2017 0444   VLDL 21 02/17/2017 0444   LDLCALC 125 (H) 02/17/2017 0444    Physical Exam:    VS:  BP (!) 148/82  Pulse 79   Ht 5\' 5"  (1.651 m)   Wt 228 lb 3.2 oz (103.5 kg)   SpO2 95%   BMI 37.97 kg/m     Wt Readings from Last 3 Encounters:  11/29/20 228 lb 3.2 oz (103.5 kg)  07/19/18 215 lb (97.5 kg)  04/18/17 213 lb 6.4 oz (96.8 kg)     GEN: Obese with BMI 37.9. No acute distress HEENT: Normal NECK: No JVD. LYMPHATICS: No lymphadenopathy CARDIAC: 2/6 right upper sternal and mid sternal systolic murmur. RRR no gallop, or edema. VASCULAR:  Normal Pulses. No bruits. RESPIRATORY:  Clear to auscultation without rales, wheezing or rhonchi  ABDOMEN: Soft, non-tender, non-distended, No pulsatile mass, MUSCULOSKELETAL: No deformity  SKIN: Warm and dry NEUROLOGIC:  Alert and oriented x 3 PSYCHIATRIC:  Normal affect   ASSESSMENT:    1. LVH (left ventricular hypertrophy)   2. Hypertension, unspecified type   3. Systolic murmur   4. Coronary artery disease involving native coronary artery of native heart without angina pectoris   5. Aortic atherosclerosis ([place])   6. Cerebrovascular accident (CVA), unspecified mechanism ([place])   7. Obstructive sleep apnea   8. Type 2 diabetes mellitus with complication, with long-term current use of insulin ([place])   9.  Educated about COVID-19 virus infection    PLAN:    In order of problems listed above:  1. Had moderate left ventricular hypertrophy by transesophageal echo in 2018 during CVA work-up.  LVH could signal diastolic dysfunction.  Dr. Maia Petties has been concerned about dyspnea.  Will recheck K echo cardiogram to assess systolic function and stability of hypertrophy. 2. Blood pressure is not well controlled.  Will discontinue HCTZ and start eplerenone 25 mg Monday, Wednesday, and Friday.  He will monitor blood pressures.  If we do not achieve the target of less than 130/80 mmHg, will increase the dose to daily.  After he has been on eplerenone for 7 to 10 days, a repeat basic metabolic panel will be done. 3. There is a systolic murmur that is relatively soft.  This is likely secondary to outflow obstruction.  Rule out aortic valve involvement.  There was mild calcification in the aortic leaflets in 2018. 4. Symptomatic coronary disease.  Primary prevention should be key.  He is not exercising.  We recommended 30 minutes of moderate activity 5 out of the 7 days of the week to achieve at least 150 minutes.  He also needs to lose weight.  LDL cholesterol should be less than 70.  Blood pressure less than 130/80 mmHg.  He is compliant with CPAP.  Gets greater than 6 hours sleep each evening.  He no longer smokes cigarettes.  A1c however is a big issue and is greater than 7.5. 5. As #5 6. Same as #5 7. Encourage compliance with CPAP. 8. He would benefit greatly from the addition of Farxiga (dapagliflozin) or Jardiance (empagliflozin) for glycemic control.  When it becomes available, Sotagliflozin would be an even better agent as it would protect against stroke, kidney injury, and heart failure events.  Would strongly recommend an SGLT2 along with heart and lieu of Metformin. 62. Vaccinated and practicing social distancing.  Overall education and awareness concerning primary/secondary risk prevention was discussed  in detail: LDL less than 70, hemoglobin A1c less than 7, blood pressure target less than 130/80 mmHg, >150 minutes of moderate aerobic activity per week, avoidance of smoking, weight control (via diet and exercise), and continued surveillance/management of/for obstructive sleep apnea.    Medication  Adjustments/Labs and Tests Ordered: Current medicines are reviewed at length with the patient today.  Concerns regarding medicines are outlined above.  Orders Placed This Encounter  Procedures  . Basic metabolic panel  . EKG 12-Lead  . ECHOCARDIOGRAM COMPLETE   Meds ordered this encounter  Medications  . eplerenone (INSPRA) 25 MG tablet    Sig: Take 1 tablet (25 mg total) by mouth every Monday, Wednesday, and Friday.    Dispense:  45 tablet    Refill:  3    D/c HCTZ    Patient Instructions  Medication Instructions:  1) DISCONTINUE Hydrochlorothiazide 2) START Epleronene 25mg  once daily on Monday, Wednesday and Friday.  *If you need a refill on your cardiac medications before your next appointment, please call your pharmacy*   Lab Work: BMET in 7-10 days  If you have labs (blood work) drawn today and your tests are completely normal, you will receive your results only by: Marland Kitchen MyChart Message (if you have MyChart) OR . A paper copy in the mail If you have any lab test that is abnormal or we need to change your treatment, we will call you to review the results.   Testing/Procedures: Your physician has requested that you have an echocardiogram. Echocardiography is a painless test that uses sound waves to create images of your heart. It provides your doctor with information about the size and shape of your heart and how well your heart's chambers and valves are working. This procedure takes approximately one hour. There are no restrictions for this procedure.   Follow-Up: At [facility], you and your health needs are our priority.  As part of our continuing mission to provide you  with exceptional heart care, we have created designated Provider Care Teams.  These Care Teams include your primary Cardiologist (physician) and Advanced Practice Providers (APPs -  Physician Assistants and Nurse Practitioners) who all work together to provide you with the care you need, when you need it.  We recommend signing up for the patient portal called "MyChart".  Sign up information is provided on this After Visit Summary.  MyChart is used to connect with patients for Virtual Visits (Telemedicine).  Patients are able to view lab/test results, encounter notes, upcoming appointments, etc.  Non-urgent messages can be sent to your provider as well.   To learn more about what you can do with MyChart, go to [email or web address].    Your next appointment:   4-6 week(s)- can have 01/09/21 at 10:20am or 01/11/21 at 2pm.  The format for your next appointment:   In Person  Provider:   You may see Dr. Daneen Schick or one of the following Advanced Practice Providers on your designated Care Team:    Truitt Merle, NP  Cecilie Kicks, NP  Kathyrn Drown, NP    Other Instructions  Your provider recommends that you maintain 150 minutes per week of moderate aerobic activity.      Signed, Sinclair Grooms, MD  11/29/2020 3:01 PM    [facility]

## 2020-11-30 NOTE — Telephone Encounter (Signed)
Spoke with Dr. Tamala Julian and he would like for pt to try Spironolactone 25mg  QD if he is willing.  If not, start Chlorthalidone 25mg  once daily and stay off of the HCTZ.  Called pt and left message to call back to discuss. Will need BMET in 7-10 days with either of the med changes.  BMET order is in.

## 2020-11-30 NOTE — Telephone Encounter (Signed)
Pt's Pharmacy comment: Alternative Requested:NOT COVERED....OVER $140. Would Dr. Tamala Julian like to prescribe an alternative? Please address

## 2020-12-02 NOTE — Telephone Encounter (Signed)
Spoke with pt and reviewed recommendations per Dr. Tamala Julian.  Pt agreeable to try Spironolactone.  Prescription sent to pharmacy.  Pt plans to have labs drawn at PCP along with a PSA.  Pt appreciative for call.

## 2020-12-20 ENCOUNTER — Ambulatory Visit (HOSPITAL_COMMUNITY): Payer: Medicare Other | Attending: Cardiology

## 2020-12-20 ENCOUNTER — Other Ambulatory Visit: Payer: Self-pay

## 2020-12-20 DIAGNOSIS — I517 Cardiomegaly: Secondary | ICD-10-CM

## 2020-12-20 MED ORDER — PERFLUTREN LIPID MICROSPHERE
1.0000 mL | INTRAVENOUS | Status: AC | PRN
  Administered 2020-12-20: 2 mL via INTRAVENOUS

## 2020-12-21 LAB — ECHOCARDIOGRAM COMPLETE
Area-P 1/2: 2.73 cm2
S' Lateral: 2.3 cm

## 2021-01-02 ENCOUNTER — Ambulatory Visit: Payer: Medicare Other | Admitting: Neurology

## 2021-01-05 NOTE — Progress Notes (Signed)
Cardiology Office Note:    Date:  01/09/2021   ID:  John Finley, DOB 12-01-1951, MRN [id number]  PCP:  Audley Hose, MD  Cardiologist:  Sinclair Grooms, MD   Referring MD: Audley Hose, MD   Chief Complaint  Patient presents with  . Hypertension    History of Present Illness:    John Finley is a 70 y.o. 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  No complaints.  Has not been able to lose weight.  Diet has not significantly changed.  He denies orthopnea, chest pain, neurological complaints, and PND.  Since starting spironolactone, he has noted nocturia.  He did start Synjardy but could not afford the medication which is greater than $600 per prescription.  Did inform him that the canagliflozin or dapagliflozin could be used.  This will probably help the blood pressure in conjunction with spironolactone.  He is not closely monitored his blood pressures at home.  Past Medical History:  Diagnosis Date  . Arrhythmia   . Gastric ulcer   . Herniated cervical disc   . High cholesterol   . Hypertension   . Prediabetes   . Sleep apnea   . Stroke (cerebrum) [facility])     Past Surgical History:  Procedure Laterality Date  . ANKLE SURGERY    . HERNIA REPAIR    . TEE WITHOUT CARDIOVERSION N/A 02/19/2017   Procedure: TRANSESOPHAGEAL ECHOCARDIOGRAM (TEE);  Surgeon: Dorothy Spark, MD;  Location: [facility] ENDOSCOPY;  Service: Cardiovascular;  Laterality: N/A;    Current Medications: Current Meds  Medication Sig  . acyclovir (ZOVIRAX) 400 MG tablet Take 400 mg by mouth 2 (two) times daily.  Marland Kitchen amLODipine (NORVASC) 10 MG tablet Take 10 mg by mouth daily.   . clopidogrel (PLAVIX) 75 MG tablet Take 1 tablet (75 mg total) by mouth daily.  . irbesartan (AVAPRO) 300  MG tablet Take 300 mg by mouth daily.  . metFORMIN (GLUCOPHAGE-XR) 500 MG 24 hr tablet Take 500 mg by mouth daily.  . rosuvastatin (CRESTOR) 20 MG tablet Take 20 mg by mouth daily.   Marland Kitchen spironolactone (ALDACTONE) 25 MG tablet Take 1 tablet (25 mg total) by mouth daily.  . tadalafil (CIALIS) 5 MG tablet Take 5 mg by mouth daily.  . Vitamin D, Ergocalciferol, (DRISDOL) 1.25 MG (50000 UNIT) CAPS capsule Take 50,000 Units by mouth once a week.     Allergies:   Penicillins and Sulfa antibiotics   Social History   Socioeconomic History  . Marital status: Married    Spouse name: Not on file  . Number of children: 2  . Years of education: MD  . Highest education level: Not on file  Occupational History  . Occupation: Film/video editor  Tobacco Use  . Smoking status: Never Smoker  . Smokeless tobacco: Never Used  Substance and Sexual Activity  . Alcohol use: No    Comment: Rare  . Drug use: No  . Sexual activity: Not on file  Other Topics Concern  . Not on file  Social History Narrative   Lives at home w/ his wife   Right-handed   Caffeine: 1 cup coffee per day   Social Determinants of Health   Financial Resource Strain: Not on file  Food Insecurity: Not on file  Transportation Needs: Not  on file  Physical Activity: Not on file  Stress: Not on file  Social Connections: Not on file     Family History: The patient's family history includes Cancer - Other in his mother; Diabetes in his brother and mother; Heart disease in his mother; Hypertension in his sister; Other in his father; Stroke in his brother and mother.  ROS:   Please see the history of present illness.    Nocturia.  He does not drink alcohol.  All other systems reviewed and are negative.  EKGs/Labs/Other Studies Reviewed:    The following studies were reviewed today: No new data  EKG:  EKG not repeated  Recent Labs: 02/03/2020: ALT 26; BUN 13; Creatinine, Ser 1.11; Hemoglobin 15.0; Platelets 271; Potassium 3.9;  Sodium 138  Recent Lipid Panel    Component Value Date/Time   CHOL 178 02/17/2017 0444   TRIG 104 02/17/2017 0444   HDL 32 (L) 02/17/2017 0444   CHOLHDL 5.6 02/17/2017 0444   VLDL 21 02/17/2017 0444   LDLCALC 125 (H) 02/17/2017 0444    Physical Exam:    VS:  BP (!) 154/82   Pulse 83   Ht 5\' 6"  (1.676 m)   Wt 227 lb (103 kg)   SpO2 97%   BMI 36.64 kg/m     Wt Readings from Last 3 Encounters:  01/09/21 227 lb (103 kg)  11/29/20 228 lb 3.2 oz (103.5 kg)  07/19/18 215 lb (97.5 kg)     GEN: I repeated the blood pressure and got 150/80.  We are still 10-20 points above target on systolic pressure.. No acute distress HEENT: Normal NECK: No JVD. LYMPHATICS: No lymphadenopathy CARDIAC: No murmur. RRR no gallop, or edema. VASCULAR:  Normal Pulses. No bruits. RESPIRATORY:  Clear to auscultation without rales, wheezing or rhonchi  ABDOMEN: Soft, non-tender, non-distended, No pulsatile mass, MUSCULOSKELETAL: No deformity  SKIN: Warm and dry NEUROLOGIC:  Alert and oriented x 3 PSYCHIATRIC:  Normal affect   ASSESSMENT:    1. Coronary artery disease involving native coronary artery of native heart without angina pectoris   2. Hypertension, unspecified type   3. LVH (left ventricular hypertrophy)   4. Aortic atherosclerosis ([place])   5. Cerebrovascular accident (CVA), unspecified mechanism ([place])   6. Obstructive sleep apnea   7. Type 2 diabetes mellitus with complication, with long-term current use of insulin ([place])   8. Educated about COVID-19 virus infection    PLAN:    In order of problems listed above:  1. Complaints 2. Poor control.  Discussed less than 3 g sodium diet.  Discussed exercise and weight loss.  Discussed increasing the dose of spironolactone but he was not interested because of nocturia already on the current dose.  Not willing to use the beta-blocker at this point.  Wants another several months to try weight loss and diet on top of his current  regimen. 3. Observed. 4. Secondary prevention discussed. 5. Strong secondary prevention discussed. 6. Encourage compliance with CPAP. 7. Consider canagliflozin or dapagliflozin since Synjardy was too expensive. 8. Vaccinated and practicing medication.  We will give another 3 to 4 months before following up on blood pressure.  Aerobic activity, greater than 150 minutes/week, decrease caloric intake, care with sodium in diet, he does not drink alcohol, avoid nonsteroidal anti-inflammatory agents, and measure blood pressures at home at least 3 times per week with follow-up reporting to [place].   Medication Adjustments/Labs and Tests Ordered: Current medicines are reviewed at length with the patient today.  Concerns regarding medicines are outlined above.  No orders of the defined types were placed in this encounter.  No orders of the defined types were placed in this encounter.   Patient Instructions  Medication Instructions:  Your physician recommends that you continue on your current medications as directed. Please refer to the Current Medication list given to you today.  *If you need a refill on your cardiac medications before your next appointment, please call your pharmacy*   Lab Work: None If you have labs (blood work) drawn today and your tests are completely normal, you will receive your results only by: Marland Kitchen MyChart Message (if you have MyChart) OR . A paper copy in the mail If you have any lab test that is abnormal or we need to change your treatment, we will call you to review the results.   Testing/Procedures: None   Follow-Up: At [facility], you and your health needs are our priority.  As part of our continuing mission to provide you with exceptional heart care, we have created designated Provider Care Teams.  These Care Teams include your primary Cardiologist (physician) and Advanced Practice Providers (APPs -  Physician Assistants and Nurse Practitioners) who all work  together to provide you with the care you need, when you need it.  We recommend signing up for the patient portal called "MyChart".  Sign up information is provided on this After Visit Summary.  MyChart is used to connect with patients for Virtual Visits (Telemedicine).  Patients are able to view lab/test results, encounter notes, upcoming appointments, etc.  Non-urgent messages can be sent to your provider as well.   To learn more about what you can do with MyChart, go to [email or web address].    Your next appointment:   4-6 month(s)  The format for your next appointment:   In Person  Provider:   You may see Sinclair Grooms, MD or one of the following Advanced Practice Providers on your designated Care Team:    Cecilie Kicks, NP  Kathyrn Drown, NP    Other Instructions      Signed, Sinclair Grooms, MD  01/09/2021 11:08 AM    [place]

## 2021-01-09 ENCOUNTER — Other Ambulatory Visit: Payer: Self-pay

## 2021-01-09 ENCOUNTER — Encounter: Payer: Self-pay | Admitting: Interventional Cardiology

## 2021-01-09 ENCOUNTER — Ambulatory Visit (INDEPENDENT_AMBULATORY_CARE_PROVIDER_SITE_OTHER): Payer: Medicare Other | Admitting: Interventional Cardiology

## 2021-01-09 VITALS — BP 154/82 | HR 83 | Ht 66.0 in | Wt 227.0 lb

## 2021-01-09 DIAGNOSIS — I2510 Atherosclerotic heart disease of native coronary artery without angina pectoris: Secondary | ICD-10-CM | POA: Diagnosis not present

## 2021-01-09 DIAGNOSIS — I10 Essential (primary) hypertension: Secondary | ICD-10-CM

## 2021-01-09 DIAGNOSIS — E118 Type 2 diabetes mellitus with unspecified complications: Secondary | ICD-10-CM

## 2021-01-09 DIAGNOSIS — I700 Atherosclerosis of aorta: Secondary | ICD-10-CM | POA: Diagnosis not present

## 2021-01-09 DIAGNOSIS — G4733 Obstructive sleep apnea (adult) (pediatric): Secondary | ICD-10-CM

## 2021-01-09 DIAGNOSIS — I517 Cardiomegaly: Secondary | ICD-10-CM

## 2021-01-09 DIAGNOSIS — Z7189 Other specified counseling: Secondary | ICD-10-CM

## 2021-01-09 DIAGNOSIS — Z794 Long term (current) use of insulin: Secondary | ICD-10-CM

## 2021-01-09 DIAGNOSIS — I639 Cerebral infarction, unspecified: Secondary | ICD-10-CM

## 2021-01-09 NOTE — Patient Instructions (Signed)
Medication Instructions:  Your physician recommends that you continue on your current medications as directed. Please refer to the Current Medication list given to you today.  *If you need a refill on your cardiac medications before your next appointment, please call your pharmacy*   Lab Work: None If you have labs (blood work) drawn today and your tests are completely normal, you will receive your results only by: Marland Kitchen MyChart Message (if you have MyChart) OR . A paper copy in the mail If you have any lab test that is abnormal or we need to change your treatment, we will call you to review the results.   Testing/Procedures: None   Follow-Up: At [facility], you and your health needs are our priority.  As part of our continuing mission to provide you with exceptional heart care, we have created designated Provider Care Teams.  These Care Teams include your primary Cardiologist (physician) and Advanced Practice Providers (APPs -  Physician Assistants and Nurse Practitioners) who all work together to provide you with the care you need, when you need it.  We recommend signing up for the patient portal called "MyChart".  Sign up information is provided on this After Visit Summary.  MyChart is used to connect with patients for Virtual Visits (Telemedicine).  Patients are able to view lab/test results, encounter notes, upcoming appointments, etc.  Non-urgent messages can be sent to your provider as well.   To learn more about what you can do with MyChart, go to [email or web address].    Your next appointment:   4-6 month(s)  The format for your next appointment:   In Person  Provider:   You may see Sinclair Grooms, MD or one of the following Advanced Practice Providers on your designated Care Team:    Cecilie Kicks, NP  Kathyrn Drown, NP    Other Instructions

## 2021-02-13 ENCOUNTER — Encounter: Payer: Self-pay | Admitting: *Deleted

## 2021-02-14 ENCOUNTER — Ambulatory Visit (INDEPENDENT_AMBULATORY_CARE_PROVIDER_SITE_OTHER): Payer: Medicare Other | Admitting: Neurology

## 2021-02-14 ENCOUNTER — Encounter: Payer: Self-pay | Admitting: Neurology

## 2021-02-14 VITALS — BP 149/89 | HR 68 | Ht 65.0 in | Wt 228.0 lb

## 2021-02-14 DIAGNOSIS — G4733 Obstructive sleep apnea (adult) (pediatric): Secondary | ICD-10-CM

## 2021-02-14 DIAGNOSIS — I2510 Atherosclerotic heart disease of native coronary artery without angina pectoris: Secondary | ICD-10-CM

## 2021-02-14 DIAGNOSIS — R4189 Other symptoms and signs involving cognitive functions and awareness: Secondary | ICD-10-CM | POA: Diagnosis not present

## 2021-02-14 DIAGNOSIS — Z8673 Personal history of transient ischemic attack (TIA), and cerebral infarction without residual deficits: Secondary | ICD-10-CM

## 2021-02-14 NOTE — Progress Notes (Signed)
[facility]    Provider:  Dr Jaynee Eagles Referring Provider: Audley Hose, MD Primary Care Physician:  Mendel Corning, MD  CC:  Follow-up for stroke  No new symptoms. He has not had any recurrent strokes. He sometimes forgets things, he feels this is age related, he is on a cpap, it works, used it for 15 years, he is not snoring unless it doesn;t fit properly, the machine is 16+ years old, they monitor residual AHI, primary care monitors, but he is looking for a sleep doctor, I suggested he ask Dr. Maia Petties for a referral. He is interested in the new inspire.   HPI 04/18/2017:  John Finley is a 70 y.o. male here as a referral from Dr. Maia Petties for follow-up for stroke. Past medical history of obstructive sleep apnea CPAP, HTN, cardiac dysrhythmia, coronary artery disease and prediabetes. He presented to the emergency room for assessment of facial droop, unsteady gait, mild dysarthria and halting speech. He presented March 3 symptoms started the day before. He could not control his secretions and was drooling on the right side of his mouth. He was not on antiplatelet medication at the time. He improved in the hospital. He preferred Plavix for stroke prevention. MRI of the brain showed left corona radiata infarct extending to the left precentral gyrus which is embolic due to unknown source.No other focal neurologic deficits, associated symptoms, inciting events or modifiable factors.  Reviewed notes, labs and imaging from outside physicians, which showed:  Personally reviewed imaging and agree with the following:  IMPRESSION: 1. Acute ischemia within the left corona radiata extending to the left precentral gyrus. This is in keeping with the reported right-sided facial symptoms. 2. No acute hemorrhage or mass effect. 3. No intracranial or cervical arterial occlusion or high-grade stenosis. 4. Apparent mild atherosclerotic narrowing of both middle cerebral arteries is  favored to be an artifact due to motion. 5. Multifocal narrowing of the non-dominant right vertebral artery V4 segment, prior to its termination in the right PICA.  reviwed labs ldl 125, hgba1c 6.8  Review of Systems: Patient complains of symptoms per HPI as well as the following symptoms:sleep apnea . Pertinent negatives and positives per HPI. All others negative    Social History   Socioeconomic History  . Marital status: Married    Spouse name: Not on file  . Number of children: 2  . Years of education: MD  . Highest education level: Not on file  Occupational History  . Occupation: Film/video editor  Tobacco Use  . Smoking status: Former Smoker    Packs/day: 2.00    Years: 30.00    Pack years: 60.00    Types: Cigarettes    Quit date: 2006    Years since quitting: 16.1  . Smokeless tobacco: Never Used  Vaping Use  . Vaping Use: Never used  Substance and Sexual Activity  . Alcohol use: No    Comment: Rare  . Drug use: No  . Sexual activity: Not on file  Other Topics Concern  . Not on file  Social History Narrative   Lives at home w/ his wife   Right-handed   Caffeine: 1 cup coffee per day      Social Determinants of Health   Financial Resource Strain: Not on file  Food Insecurity: Not on file  Transportation Needs: Not on file  Physical Activity: Not on file  Stress: Not on file  Social Connections: Not on file  Intimate Partner Violence: Not on file  Family History  Problem Relation Age of Onset  . Cancer - Other Mother   . Diabetes Mother   . Stroke Mother   . Heart disease Mother   . Alcohol abuse Mother   . Hypertension Mother   . Other Father        Accident  . Hypertension Sister   . Pulmonary embolism Sister   . Diabetes Brother   . Hypertension Brother   . Stroke Brother   . Depression Brother   . Diabetes Brother     Past Medical History:  Diagnosis Date  . Arrhythmia   . Cervical spondylosis without myelopathy   . Chronic low back  pain   . Colon polyp   . Gastric ulcer   . Gastric ulcer   . Herniated cervical disc   . Herpes zoster   . High cholesterol   . Hypertension   . Kidney stone   . Minimal cognitive impairment   . OSA (obstructive sleep apnea)   . Prediabetes   . Seasonal allergic rhinitis   . Sleep apnea   . Spinal stenosis of lumbar region   . Stroke (cerebrum) ([place])   . Type 2 diabetes mellitus ([place])   . Vitamin D deficiency     Past Surgical History:  Procedure Laterality Date  . ANKLE SURGERY    . HERNIA REPAIR    . TEE WITHOUT CARDIOVERSION N/A 02/19/2017   Procedure: TRANSESOPHAGEAL ECHOCARDIOGRAM (TEE);  Surgeon: Dorothy Spark, MD;  Location: [facility] ENDOSCOPY;  Service: Cardiovascular;  Laterality: N/A;    Current Outpatient Medications  Medication Sig Dispense Refill  . acyclovir (ZOVIRAX) 400 MG tablet Take 400 mg by mouth 2 (two) times daily.    Marland Kitchen amLODipine (NORVASC) 10 MG tablet Take 10 mg by mouth daily.     . clopidogrel (PLAVIX) 75 MG tablet Take 1 tablet (75 mg total) by mouth daily. 90 tablet 3  . irbesartan (AVAPRO) 300 MG tablet Take 300 mg by mouth daily.    . rosuvastatin (CRESTOR) 20 MG tablet Take 20 mg by mouth daily.     . sitaGLIPtin-metformin (JANUMET) 50-1000 MG tablet Take 1 tablet by mouth daily.    Marland Kitchen spironolactone (ALDACTONE) 25 MG tablet Take 1 tablet (25 mg total) by mouth daily. 90 tablet 3  . tadalafil (CIALIS) 5 MG tablet Take 5 mg by mouth daily.    . Vitamin D, Ergocalciferol, (DRISDOL) 1.25 MG (50000 UNIT) CAPS capsule Take 50,000 Units by mouth once a week.     No current facility-administered medications for this visit.    Allergies as of 02/14/2021 - Review Complete 02/14/2021  Allergen Reaction Noted  . Penicillins Itching and Rash 10/05/2016  . Sulfa antibiotics Hives, Itching, and Rash 10/05/2016    Vitals: BP (!) 149/89 (BP Location: Left Arm, Patient Position: Sitting)   Pulse 68   Ht 5\' 5"  (1.651 m)   Wt 228 lb (103.4 kg)   BMI 37.94  kg/m  Last Weight:  Wt Readings from Last 1 Encounters:  02/14/21 228 lb (103.4 kg)   Last Height:   Ht Readings from Last 1 Encounters:  02/14/21 5\' 5"  (1.651 m)   Exam: NAD, pleasant                  Speech:    Speech is normal; fluent and spontaneous with normal comprehension.  Cognition:    The patient is oriented to person, place, and time;     recent and remote memory  intact;     language fluent;    Cranial Nerves:    The pupils are equal, round, and reactive to light.Trigeminal sensation is intact and the muscles of mastication are normal. The face is symmetric. The palate elevates in the midline. Hearing intact. Voice is normal. Shoulder shrug is normal. The tongue has normal motion without fasciculations.   Coordination:  No dysmetria  Motor Observation:    No asymmetry, no atrophy, and no involuntary movements noted. Tone:    Normal muscle tone.     Strength:    Strength is V/V in the upper and lower limbs.      Sensation: intact to LT     Assessment/Plan:  Mr. John Finley is a lovely 70 y.o. male with history of HTN, pre-diabetes, cardiac arrhythmia since teen and OSA presented with R facial droop, dysarthria and halting speech in 2018. He did not receive IV t-PA due to late arrival. Symptoms resolved. No recurrence and he has bene well, here for follow up, also interested in sleep referral and the new [employer].   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 No source of embolus   LDL 125, HgbA1c 6.8in 2018. Most recent 47 and 6.5  Recommend loop discussed increased risk of stroke with afib/aflutter and he declined. He wore a heart monitor for 30 days and had other monitoring as well, no afib, no recurrent symptoms or stroke  Sleep Apnea: I reached out  to Dr. Maia Petties for referral, Dr. Montez Morita is interested in new Inspire technology, he has OSA and has had the came cpap machine for 10+ years, no one manages his sleep apnea,.Our sleep team will review referral and see if he is appropriate to see sleep physician.   I had a long d/w patient about his remote stroke, risk for recurrent stroke/TIAs, personally independently reviewed imaging studies and stroke evaluation results and answered questions.Continue Plavix for secondary stroke prevention and maintain strict control of hypertension with blood pressure goal below 130/90, diabetes with hemoglobin A1c goal below 6.5% and lipids with LDL cholesterol goal below 70 mg/dL.   Memory loss? Appears to be normla cognitive aging however we discussed formal memory testing, blood work, repeat MRI brain, he declines at this time. He is always welcome back.    Cc:Dr. Thersa Salt, MD  [facility] [address] [place], [place] [address]  Phone [phone] Fax [phone]  I spent 30 minutes of face-to-face and non-face-to-face time with patient on the  1. Remote history of stroke   2. Obstructive sleep apnea syndrome   3. Subjective memory complaints    diagnosis.  This included previsit chart review, lab review, study review, order entry, electronic health record documentation, patient education on the different diagnostic and therapeutic options, counseling and coordination of care, risks and benefits of management, compliance, or risk factor reduction

## 2021-05-30 DIAGNOSIS — R011 Cardiac murmur, unspecified: Secondary | ICD-10-CM | POA: Insufficient documentation

## 2021-10-22 DIAGNOSIS — I700 Atherosclerosis of aorta: Secondary | ICD-10-CM | POA: Insufficient documentation

## 2021-10-22 DIAGNOSIS — Z87891 Personal history of nicotine dependence: Secondary | ICD-10-CM | POA: Insufficient documentation

## 2021-10-22 DIAGNOSIS — G8929 Other chronic pain: Secondary | ICD-10-CM | POA: Insufficient documentation

## 2021-10-22 DIAGNOSIS — E1142 Type 2 diabetes mellitus with diabetic polyneuropathy: Secondary | ICD-10-CM | POA: Insufficient documentation

## 2021-10-22 DIAGNOSIS — E119 Type 2 diabetes mellitus without complications: Secondary | ICD-10-CM | POA: Insufficient documentation

## 2021-10-23 ENCOUNTER — Other Ambulatory Visit: Payer: Self-pay | Admitting: Internal Medicine

## 2021-10-24 ENCOUNTER — Other Ambulatory Visit: Payer: Self-pay | Admitting: Internal Medicine

## 2021-10-24 DIAGNOSIS — Z122 Encounter for screening for malignant neoplasm of respiratory organs: Secondary | ICD-10-CM

## 2021-11-20 ENCOUNTER — Ambulatory Visit
Admission: RE | Admit: 2021-11-20 | Discharge: 2021-11-20 | Disposition: A | Discharge status: Home / Self Care | Payer: Medicare Other | Source: Ambulatory Visit | Attending: Internal Medicine | Admitting: Internal Medicine

## 2021-11-20 ENCOUNTER — Other Ambulatory Visit: Payer: Self-pay

## 2021-11-20 DIAGNOSIS — Z122 Encounter for screening for malignant neoplasm of respiratory organs: Secondary | ICD-10-CM

## 2022-01-26 ENCOUNTER — Other Ambulatory Visit: Payer: Self-pay | Admitting: Urology

## 2022-01-26 DIAGNOSIS — R972 Elevated prostate specific antigen [PSA]: Secondary | ICD-10-CM

## 2022-01-30 ENCOUNTER — Other Ambulatory Visit: Payer: Self-pay | Admitting: Internal Medicine

## 2022-01-30 DIAGNOSIS — R0989 Other specified symptoms and signs involving the circulatory and respiratory systems: Secondary | ICD-10-CM

## 2022-02-01 ENCOUNTER — Ambulatory Visit
Admission: RE | Admit: 2022-02-01 | Discharge: 2022-02-01 | Disposition: A | Discharge status: Home / Self Care | Payer: Medicare Other | Source: Ambulatory Visit | Attending: Internal Medicine | Admitting: Internal Medicine

## 2022-02-01 DIAGNOSIS — R0989 Other specified symptoms and signs involving the circulatory and respiratory systems: Secondary | ICD-10-CM

## 2022-02-15 ENCOUNTER — Ambulatory Visit
Admission: RE | Admit: 2022-02-15 | Discharge: 2022-02-15 | Disposition: A | Discharge status: Home / Self Care | Payer: Medicare Other | Source: Ambulatory Visit | Attending: Urology | Admitting: Urology

## 2022-02-15 ENCOUNTER — Other Ambulatory Visit: Payer: Self-pay

## 2022-02-15 DIAGNOSIS — R972 Elevated prostate specific antigen [PSA]: Secondary | ICD-10-CM

## 2022-02-15 MED ORDER — GADOBENATE DIMEGLUMINE 529 MG/ML IV SOLN
19.0000 mL | Freq: Once | INTRAVENOUS | Status: AC | PRN
  Administered 2022-02-15: 19 mL via INTRAVENOUS

## 2022-02-22 ENCOUNTER — Encounter: Payer: Self-pay | Admitting: Vascular Surgery

## 2022-02-22 ENCOUNTER — Other Ambulatory Visit: Payer: Self-pay

## 2022-02-22 ENCOUNTER — Ambulatory Visit (INDEPENDENT_AMBULATORY_CARE_PROVIDER_SITE_OTHER): Payer: Medicare Other | Admitting: Vascular Surgery

## 2022-02-22 VITALS — BP 130/68 | HR 88 | Temp 98.1°F | Resp 20 | Ht 65.0 in | Wt 222.5 lb

## 2022-02-22 DIAGNOSIS — I70219 Atherosclerosis of native arteries of extremities with intermittent claudication, unspecified extremity: Secondary | ICD-10-CM | POA: Diagnosis not present

## 2022-02-22 DIAGNOSIS — I6523 Occlusion and stenosis of bilateral carotid arteries: Secondary | ICD-10-CM

## 2022-02-22 DIAGNOSIS — I739 Peripheral vascular disease, unspecified: Secondary | ICD-10-CM

## 2022-02-22 NOTE — Progress Notes (Signed)
? ?ASSESSMENT & PLAN  ? ?BILATERAL CAROTID DISEASE: Patient has a less than 50% distal common carotid artery stenosis on the right and a 40 to 59% left internal carotid artery stenosis.  He has had no further symptoms since he has been on Plavix.  He is on a statin.  I encouraged him to continue to exercise.  I have ordered a follow-up carotid duplex scan in 1 year and I will see him back at that time.  He understands we would not consider carotid endarterectomy unless the stenosis progressed to greater than 80% or he develop new symptoms. ? ?PERIPHERAL ARTERIAL DISEASE: Based on his history and exam I think he likely has some underlying aortoiliac occlusive disease and also possibly some infrainguinal arterial occlusive disease.  However he has stable claudication with no rest pain or nonhealing ulcers.  I encouraged him to continue to exercise.  He is not a smoker.  We will get baseline ABIs when he comes back in 1 year. ? ?REASON FOR CONSULT:   ? ?Bilateral carotid disease.  The consult is requested by Dr. Deidre Ala. ? ?HPI:  ? ?John Finley is a 71 y.o. male who was referred with bilateral carotid disease. I reviewed his office visit with [facility] on 02/14/2021.  This was for a follow-up visit after a stroke.  He had presented to the emergency department with facial droop and unsteady gait in addition to mild dysarthria.  He was not on antiplatelet medication at the time.  His symptoms improved during his hospitalization.  He was placed on Plavix for stroke prevention.  MRI of the brain showed a left corona radiata infarct extending to the left precentral gyrus which appeared to be embolic in etiology. ? ?On my history, he denies any further symptoms since he was started on Plavix.  Specifically, he denies any history of stroke, TIAs, expressive or receptive aphasia, or amaurosis fugax. ? ?He does admit to bilateral hip claudication which occurs after walking 1-2 blocks.  The symptoms are  relieved with rest.  He denies calf claudication.  He denies any history of rest pain or nonhealing ulcers. ? ?His risk factors for peripheral vascular disease include type 2 diabetes, hypertension, and hypercholesterolemia.  He has a remote history of tobacco use.  He has no family history of premature cardiovascular disease. ? ?Past Medical History:  ?Diagnosis Date  ? Arrhythmia   ? Cervical spondylosis without myelopathy   ? Chronic low back pain   ? Colon polyp   ? Gastric ulcer   ? Gastric ulcer   ? Herniated cervical disc   ? Herpes zoster   ? High cholesterol   ? Hypertension   ? Kidney stone   ? Minimal cognitive impairment   ? OSA (obstructive sleep apnea)   ? Prediabetes   ? Seasonal allergic rhinitis   ? Sleep apnea   ? Spinal stenosis of lumbar region   ? Stroke (cerebrum) ([place])   ? Type 2 diabetes mellitus ([place])   ? Vitamin D deficiency   ? ? ?Family History  ?Problem Relation Age of Onset  ? Cancer - Other Mother   ? Diabetes Mother   ? Stroke Mother   ? Heart disease Mother   ? Alcohol abuse Mother   ? Hypertension Mother   ? Other Father   ?     Accident  ? Hypertension Sister   ? Pulmonary embolism Sister   ? Diabetes Brother   ? Hypertension Brother   ?  Stroke Brother   ? Depression Brother   ? Diabetes Brother   ? ? ?SOCIAL HISTORY: ?Social History  ? ?Tobacco Use  ? Smoking status: Former  ?  Packs/day: 2.00  ?  Years: 30.00  ?  Pack years: 60.00  ?  Types: Cigarettes  ?  Quit date: 2006  ?  Years since quitting: 17.1  ? Smokeless tobacco: Never  ?Substance Use Topics  ? Alcohol use: No  ?  Comment: Rare  ? ? ?Allergies  ?Allergen Reactions  ? Penicillins Itching and Rash  ? Sulfa Antibiotics Hives, Itching and Rash  ? ? ?Current Outpatient Medications  ?Medication Sig Dispense Refill  ? acyclovir (ZOVIRAX) 400 MG tablet Take 400 mg by mouth 2 (two) times daily.    ? amLODipine (NORVASC) 10 MG tablet Take 10 mg by mouth daily.     ? clopidogrel (PLAVIX) 75 MG tablet Take 1 tablet (75 mg total)  by mouth daily. 90 tablet 3  ? Empagliflozin-metFORMIN HCl ER (SYNJARDY XR) 09-999 MG TB24 Synjardy XR 10 mg-1,000 mg tablet, extended release    ? irbesartan (AVAPRO) 300 MG tablet Take 300 mg by mouth daily.    ? rosuvastatin (CRESTOR) 20 MG tablet Take 20 mg by mouth daily.     ? spironolactone (ALDACTONE) 25 MG tablet Take 1 tablet (25 mg total) by mouth daily. 90 tablet 3  ? tadalafil (CIALIS) 5 MG tablet Take 5 mg by mouth daily.    ? ?No current facility-administered medications for this visit.  ? ? ?REVIEW OF SYSTEMS:  ?'[X]'$  denotes positive finding, '[ ]'$  denotes negative finding ?Cardiac  Comments:  ?Chest pain or chest pressure:    ?Shortness of breath upon exertion:    ?Short of breath when lying flat:    ?Irregular heart rhythm:    ?    ?Vascular    ?Pain in calf, thigh, or hip brought on by ambulation: x   ?Pain in feet at night that wakes you up from your sleep:     ?Blood clot in your veins:    ?Leg swelling:     ?    ?Pulmonary    ?Oxygen at home:    ?Productive cough:     ?Wheezing:     ?    ?Neurologic    ?Sudden weakness in arms or legs:     ?Sudden numbness in arms or legs:     ?Sudden onset of difficulty speaking or slurred speech: x   ?Temporary loss of vision in one eye:     ?Problems with dizziness:     ?    ?Gastrointestinal    ?Blood in stool:     ?Vomited blood:     ?    ?Genitourinary    ?Burning when urinating:     ?Blood in urine:    ?    ?Psychiatric    ?Major depression:     ?    ?Hematologic    ?Bleeding problems:    ?Problems with blood clotting too easily:    ?    ?Skin    ?Rashes or ulcers:    ?    ?Constitutional    ?Fever or chills:    ?- ? ?PHYSICAL EXAM:  ? ?Vitals:  ? 02/22/22 1500  ?BP: 133/72  ?Pulse: 88  ?Resp: 20  ?Temp: 98.1 ?F (36.7 ?C)  ?SpO2: 96%  ?Weight: 222 lb 8 oz (100.9 kg)  ?Height: '5\' 5"'$  (1.651 m)  ? ?Body mass index  is 37.03 kg/m?. ?GENERAL: The patient is a well-nourished male, in no acute distress. The vital signs are documented above. ?CARDIAC: There is a  regular rate and rhythm.  ?VASCULAR: I do not detect carotid bruits. ?He has diminished femoral pulses. ?He does have a palpable right posterior tibial pulse and left dorsalis pedis pulse. ?He has a biphasic posterior tibial signal with the Doppler bilaterally. ?PULMONARY: There is good air exchange bilaterally without wheezing or rales. ?ABDOMEN: Soft and non-tender with normal pitched bowel sounds.  I do not palpate an aneurysm. ?MUSCULOSKELETAL: There are no major deformities. ?NEUROLOGIC: No focal weakness or paresthesias are detected. ?SKIN: There are no ulcers or rashes noted. ?PSYCHIATRIC: The patient has a normal affect. ? ?DATA:   ? ?CAROTID DUPLEX: I reviewed his carotid duplex scan that was done in March.  This shows a less than 50% distal right common carotid artery stenosis.  The right vertebral artery is patent with antegrade flow.  On the left side is a 40 to 59% left ICA stenosis.  The left vertebral artery is patent with antegrade flow. ? ? ?Deitra Mayo ?[facility] ?

## 2022-03-09 ENCOUNTER — Other Ambulatory Visit (HOSPITAL_COMMUNITY): Payer: Self-pay | Admitting: Urology

## 2022-03-12 ENCOUNTER — Other Ambulatory Visit (HOSPITAL_COMMUNITY): Payer: Self-pay | Admitting: Urology

## 2022-03-12 DIAGNOSIS — C61 Malignant neoplasm of prostate: Secondary | ICD-10-CM

## 2022-03-21 ENCOUNTER — Encounter (HOSPITAL_COMMUNITY)
Admission: RE | Admit: 2022-03-21 | Discharge: 2022-03-21 | Disposition: A | Discharge status: Home / Self Care | Payer: Medicare Other | Source: Ambulatory Visit | Attending: Urology | Admitting: Urology

## 2022-03-21 DIAGNOSIS — C61 Malignant neoplasm of prostate: Secondary | ICD-10-CM | POA: Diagnosis present

## 2022-03-21 MED ORDER — PIFLIFOLASTAT F 18 (PYLARIFY) INJECTION
9.0000 | Freq: Once | INTRAVENOUS | Status: AC
  Administered 2022-03-21: 9 via INTRAVENOUS

## 2022-03-27 ENCOUNTER — Other Ambulatory Visit (HOSPITAL_COMMUNITY): Payer: Medicare Other

## 2022-04-16 NOTE — Progress Notes (Signed)
[facility] Location of Tumor / Histology: Prostate Ca ? ?If Prostate Cancer, Gleason Score is (4 + 3) and PSA is (5.8 as of 03/2022) ? ?Biopsies: ?Dr. Jeffie Pollock ? ? ? ? ?Past/Anticipated interventions by urology, if any:  ? ?Past/Anticipated interventions by medical oncology, if any:  ? ?Weight changes, if any: No ? ?IPSS:  13 ?SHIM:  14 ? ?Bowel/Bladder complaints, if any:  Nocturia x 1 and urinary urgency. ? ?Nausea/Vomiting, if any: No ? ?Pain issues, if any:  3 left ankle, thigh and lower back. ? ?SAFETY ISSUES: ?Prior radiation? No ?Pacemaker/ICD? No ?Possible current pregnancy? Male ?Is the patient on methotrexate? No ? ?Current Complaints / other details:   ?

## 2022-04-18 ENCOUNTER — Other Ambulatory Visit: Payer: Self-pay

## 2022-04-18 ENCOUNTER — Ambulatory Visit
Admission: RE | Admit: 2022-04-18 | Discharge: 2022-04-18 | Disposition: A | Discharge status: Home / Self Care | Payer: Medicare Other | Source: Ambulatory Visit | Attending: Radiation Oncology | Admitting: Radiation Oncology

## 2022-04-18 DIAGNOSIS — Z8673 Personal history of transient ischemic attack (TIA), and cerebral infarction without residual deficits: Secondary | ICD-10-CM | POA: Insufficient documentation

## 2022-04-18 DIAGNOSIS — Z79899 Other long term (current) drug therapy: Secondary | ICD-10-CM | POA: Insufficient documentation

## 2022-04-18 DIAGNOSIS — Z86010 Personal history of colonic polyps: Secondary | ICD-10-CM | POA: Insufficient documentation

## 2022-04-18 DIAGNOSIS — Z8711 Personal history of peptic ulcer disease: Secondary | ICD-10-CM | POA: Diagnosis not present

## 2022-04-18 DIAGNOSIS — E119 Type 2 diabetes mellitus without complications: Secondary | ICD-10-CM | POA: Diagnosis not present

## 2022-04-18 DIAGNOSIS — I700 Atherosclerosis of aorta: Secondary | ICD-10-CM | POA: Diagnosis not present

## 2022-04-18 DIAGNOSIS — G8929 Other chronic pain: Secondary | ICD-10-CM | POA: Diagnosis not present

## 2022-04-18 DIAGNOSIS — C61 Malignant neoplasm of prostate: Secondary | ICD-10-CM

## 2022-04-18 DIAGNOSIS — E559 Vitamin D deficiency, unspecified: Secondary | ICD-10-CM | POA: Diagnosis not present

## 2022-04-18 DIAGNOSIS — Z87891 Personal history of nicotine dependence: Secondary | ICD-10-CM | POA: Insufficient documentation

## 2022-04-18 DIAGNOSIS — Z87442 Personal history of urinary calculi: Secondary | ICD-10-CM | POA: Diagnosis not present

## 2022-04-18 DIAGNOSIS — E7800 Pure hypercholesterolemia, unspecified: Secondary | ICD-10-CM | POA: Diagnosis not present

## 2022-04-18 DIAGNOSIS — I10 Essential (primary) hypertension: Secondary | ICD-10-CM | POA: Insufficient documentation

## 2022-04-18 DIAGNOSIS — Z7984 Long term (current) use of oral hypoglycemic drugs: Secondary | ICD-10-CM | POA: Diagnosis not present

## 2022-04-18 DIAGNOSIS — G4730 Sleep apnea, unspecified: Secondary | ICD-10-CM | POA: Insufficient documentation

## 2022-04-18 DIAGNOSIS — I499 Cardiac arrhythmia, unspecified: Secondary | ICD-10-CM | POA: Insufficient documentation

## 2022-04-18 DIAGNOSIS — I2510 Atherosclerotic heart disease of native coronary artery without angina pectoris: Secondary | ICD-10-CM | POA: Diagnosis not present

## 2022-04-18 DIAGNOSIS — K5730 Diverticulosis of large intestine without perforation or abscess without bleeding: Secondary | ICD-10-CM | POA: Diagnosis not present

## 2022-04-18 NOTE — Progress Notes (Signed)
?Radiation Oncology         [phone] ?________________________________ ? ?Initial Outpatient Consultation ? ?Name: John Finley MRN: [id number]  ?Date: 04/18/2022  DOB: [birth year] ? ?WF:UXNATF, Larey Dresser, MD  Irine Seal, MD  ? ?REFERRING PHYSICIAN: Irine Seal, MD ? ?DIAGNOSIS: 71 y.o. 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 ? ?  ICD-10-CM   ?1. Malignant neoplasm of prostate ([place])  C61   ?  ? ? ?HISTORY OF PRESENT ILLNESS: John Finley is a 71 y.o. male with a diagnosis of prostate cancer. He was noted to have an elevated PSA of 5.83 by his primary care physician, Dr. Latanya Presser.  Accordingly, he was referred for evaluation in urology by Dr. Jeffie Pollock on 01/03/2022,  digital rectal examination was performed at that time revealing no nodules or concerning findings.  A prostate MRI was performed on 02/15/2022 showing a PI-RADS 5 lesion in the left peripheral zone with suspicion of extracapsular extension and involvement of the neurovascular bundle.  Additionally, there was a PI-RADS 4 lesion in the right anterior transitional zone and a PI-RADS 3 lesion bilaterally in the posterior medial peripheral zone.  The patient proceeded to MRI fusion transrectal ultrasound with 21 biopsies of the prostate on 03/06/2022.  The prostate volume measured 38 cc.  Out of 21 core biopsies, 16 were positive.  The maximum Gleason score was 3+5, and this was seen in the left mid core.  Additionally, Gleason 4+4 was seen in 2 of 3 cores from ROI #3 and Gleason 4+3 was seen in the other core from this lesion.  Gleason 4+3 was also seen in the left base lateral, left apex, right mid and right base lateral.  Gleason 3+4 was seen in 3 out of 3 cores from ROI #1, 2 of 2 cores from ROI #2 and the left mid lateral and left base cores. ? ?A PSMA PET scan was performed on 03/21/2022 for disease staging and was without any evidence of visceral or osseous metastatic disease. ? ?The  patient reviewed the biopsy results with his urologist and he has kindly been referred today for discussion of potential radiation treatment options.  At the time of his most recent follow-up visit with Dr. Jeffie Pollock, he was most interested in proceeding with prostate IMRT concurrent with ADT and was started on oral ADT with Orgovyx on 04/11/2022. ? ? ?PREVIOUS RADIATION THERAPY: No ? ?PAST MEDICAL HISTORY:  ?Past Medical History:  ?Diagnosis Date  ? Arrhythmia   ? Cervical spondylosis without myelopathy   ? Chronic low back pain   ? Colon polyp   ? Gastric ulcer   ? Gastric ulcer   ? Herniated cervical disc   ? Herpes zoster   ? High cholesterol   ? Hypertension   ? Kidney stone   ? Minimal cognitive impairment   ? OSA (obstructive sleep apnea)   ? Prediabetes   ? Seasonal allergic rhinitis   ? Sleep apnea   ? Spinal stenosis of lumbar region   ? Stroke (cerebrum) ([place])   ? Type 2 diabetes mellitus ([place])   ? Vitamin D deficiency   ?   ? ?PAST SURGICAL HISTORY: ?Past Surgical History:  ?Procedure Laterality Date  ? ANKLE SURGERY    ? HERNIA REPAIR    ? TEE WITHOUT CARDIOVERSION N/A 02/19/2017  ? Procedure: TRANSESOPHAGEAL ECHOCARDIOGRAM (TEE);  Surgeon: Dorothy Spark, MD;  Location: [facility];  Service: Cardiovascular;  Laterality:  N/A;  ? ? ?FAMILY HISTORY:  ?Family History  ?Problem Relation Age of Onset  ? Cancer - Other Mother   ? Diabetes Mother   ? Stroke Mother   ? Heart disease Mother   ? Alcohol abuse Mother   ? Hypertension Mother   ? Other Father   ?     Accident  ? Hypertension Sister   ? Pulmonary embolism Sister   ? Diabetes Brother   ? Hypertension Brother   ? Stroke Brother   ? Depression Brother   ? Diabetes Brother   ? ? ?SOCIAL HISTORY:  ?Social History  ? ?Socioeconomic History  ? Marital status: Married  ?  Spouse name: Not on file  ? Number of children: 2  ? Years of education: MD  ? Highest education level: Not on file  ?Occupational History  ? Occupation: Cardiologist  ?Tobacco Use  ? Smoking  status: Former  ?  Packs/day: 2.00  ?  Years: 30.00  ?  Pack years: 60.00  ?  Types: Cigarettes  ?  Quit date: 2006  ?  Years since quitting: 17.3  ? Smokeless tobacco: Never  ?Vaping Use  ? Vaping Use: Never used  ?Substance and Sexual Activity  ? Alcohol use: No  ?  Comment: Rare  ? Drug use: No  ? Sexual activity: Not on file  ?Other Topics Concern  ? Not on file  ?Social History Narrative  ? Lives at home w/ his wife  ? Right-handed  ? Caffeine: 1 cup coffee per day  ?   ? ?Social Determinants of Health  ? ?Financial Resource Strain: Not on file  ?Food Insecurity: Not on file  ?Transportation Needs: Not on file  ?Physical Activity: Not on file  ?Stress: Not on file  ?Social Connections: Not on file  ?Intimate Partner Violence: Not on file  ? ? ?ALLERGIES: Penicillins and Sulfa antibiotics ? ?MEDICATIONS:  ?Current Outpatient Medications  ?Medication Sig Dispense Refill  ? acyclovir (ZOVIRAX) 400 MG tablet Take 400 mg by mouth 2 (two) times daily.    ? amLODipine (NORVASC) 10 MG tablet Take 10 mg by mouth daily.     ? clopidogrel (PLAVIX) 75 MG tablet Take 1 tablet (75 mg total) by mouth daily. 90 tablet 3  ? Empagliflozin-metFORMIN HCl ER (SYNJARDY XR) 09-999 MG TB24 Synjardy XR 10 mg-1,000 mg tablet, extended release    ? irbesartan (AVAPRO) 300 MG tablet Take 300 mg by mouth daily.    ? rosuvastatin (CRESTOR) 20 MG tablet Take 20 mg by mouth daily.     ? spironolactone (ALDACTONE) 25 MG tablet Take 1 tablet (25 mg total) by mouth daily. 90 tablet 3  ? tadalafil (CIALIS) 5 MG tablet Take 5 mg by mouth daily.    ? ?No current facility-administered medications for this encounter.  ? ? ?REVIEW OF SYSTEMS:  On review of systems, the patient reports that he is doing well overall. He denies any chest pain, shortness of breath, cough, fevers, chills, night sweats, unintended weight changes. He denies any bowel disturbances, and denies abdominal pain, nausea or vomiting. He denies any new musculoskeletal or joint  aches or pains. His IPSS was 13, indicating moderate urinary symptoms. His SHIM was 14, indicating he has moderate erectile dysfunction. A complete review of systems is obtained and is otherwise negative. ? ?  ?PHYSICAL EXAM:  ?Wt Readings from Last 3 Encounters:  ?04/18/22 220 lb (99.8 kg)  ?02/22/22 222 lb 8 oz (100.9 kg)  ?02/14/21  228 lb (103.4 kg)  ? ?Temp Readings from Last 3 Encounters:  ?04/18/22 (!) 97.1 ?F (36.2 ?C) (Temporal)  ?02/22/22 98.1 ?F (36.7 ?C)  ?02/03/20 98.6 ?F (37 ?C) (Oral)  ? ?BP Readings from Last 3 Encounters:  ?04/18/22 (!) 151/84  ?02/22/22 130/68  ?02/14/21 (!) 149/89  ? ?Pulse Readings from Last 3 Encounters:  ?04/18/22 72  ?02/22/22 88  ?02/14/21 68  ? ?Pain Assessment ?Pain Score: 3  ?Pain Loc: Ankle (left and lower back pain, and right thigh)/10 ? ?In general this is a well appearing African-American male in no acute distress. He's alert and oriented x4 and appropriate throughout the examination. Cardiopulmonary assessment is negative for acute distress, and he exhibits normal effort.   ? ? ?KPS = 100 ? ?100 - Normal; no complaints; no evidence of disease. ?90   - Able to carry on normal activity; minor signs or symptoms of disease. ?80   - Normal activity with effort; some signs or symptoms of disease. ?74   - Cares for self; unable to carry on normal activity or to do active work. ?60   - Requires occasional assistance, but is able to care for most of his personal needs. ?50   - Requires considerable assistance and frequent medical care. ?11   - Disabled; requires special care and assistance. ?30   - Severely disabled; hospital admission is indicated although death not imminent. ?20   - Very sick; hospital admission necessary; active supportive treatment necessary. ?10   - Moribund; fatal processes progressing rapidly. ?0     - Dead ? ?Karnofsky DA, Abelmann WH, Craver LS and Burchenal [facility] [phone]) The use of the nitrogen mustards in the palliative treatment of carcinoma: with  particular reference to bronchogenic carcinoma Cancer 1 [phone] ? ?LABORATORY DATA:  ?Lab Results  ?Component Value Date  ? WBC 7.9 02/03/2020  ? HGB 15.0 02/03/2020  ? HCT 44.5 02/03/2020  ? MCV 89.2 02/17/2

## 2022-05-08 ENCOUNTER — Other Ambulatory Visit: Payer: Self-pay | Admitting: Urology

## 2022-05-09 ENCOUNTER — Telehealth: Payer: Self-pay | Admitting: *Deleted

## 2022-05-09 NOTE — Telephone Encounter (Signed)
CALLED PATIENT TO INFORM OF MARKERS SEED TO BE PLACED ON 06-08-22 AND HIS SIM ON 06-14-22 - ARRIVAL TIME- 10:45 AM, INFORMED PATIENT TO ARRIVE WITH A FULL BLADDER AND AN EMPTY BOWEL, SPOKE WITH PATIENT AND HE IS AWARE OF THESE APPTS. AND THE INSTRUCTIONS

## 2022-05-21 ENCOUNTER — Telehealth: Payer: Self-pay | Admitting: Interventional Cardiology

## 2022-05-21 NOTE — Telephone Encounter (Signed)
   Pre-operative Risk Assessment    Patient Name: John Finley  DOB: Jul 30, 1951 MRN: [id number]      Request for Surgical Clearance    Procedure:   fidiucal marker and space or gal  Date of Surgery:  Clearance 06/08/22                                 Surgeon:  Dr. Rip Harbour Group or Practice Name:  [facility]   Phone number:  [phone] Fax number:  [phone]   Type of Clearance Requested:   - Medical  - Pharmacy:  Hold Clopidogrel (Plavix) 5 days prior    Type of Anesthesia:  MAC   Additional requests/questions:  Does this patient need antibiotics?   Signed, Milbert Coulter   05/21/2022, 9:45 AM

## 2022-05-21 NOTE — Progress Notes (Signed)
Reviewed pt's chart for upcoming procedure on 06-08-2022 by Dr Milinda Pointer [facility] .  Noted pt last cardiology visit was 01-09-2021 he was to follow up 4-6 months, pt did not and does not have upcoming visit , also pt is on plavix which he will need clearance for.  Called and spoke w/ Pam, OR scheduler, informed of > 1 year cardiology follow up and need plavix clearance.

## 2022-05-21 NOTE — Telephone Encounter (Signed)
   Name: John Finley  DOB: [birth year]  MRN: [id number]  Primary Cardiologist: Sinclair Grooms, MD  Chart reviewed as part of pre-operative protocol coverage. Because of [name]'s past medical history and time since last visit, he will require a follow-up in-office visit in order to better assess preoperative cardiovascular risk.  Pre-op covering staff: - Please schedule appointment and call patient to inform them. If patient already had an upcoming appointment within acceptable timeframe, please add "pre-op clearance" to the appointment notes so provider is aware. - Please contact requesting surgeon's office via preferred method (i.e, phone, fax) to inform them of need for appointment prior to surgery.  [place], [place]  05/21/2022, 8:09 PM

## 2022-05-22 NOTE — Telephone Encounter (Signed)
Spoke with patient who is agreeable to see Christen Bame, NP on 6/19 @ 1:55 pm. Patient thanked me for the call..  I will fax recommendations over to requesting provider's office.

## 2022-05-24 ENCOUNTER — Telehealth: Payer: Self-pay | Admitting: *Deleted

## 2022-05-24 NOTE — Telephone Encounter (Signed)
CALLED PATIENT TO INFORM OF SIM APPT. BEING CHANGED FROM 06-14-22 TO 06-13-22- ARRIVAL TIME- 12:45 PM, SPOKE WITH PATIENT AND HE IS AWARE OF THIS APPT.

## 2022-05-28 DIAGNOSIS — K08409 Partial loss of teeth, unspecified cause, unspecified class: Secondary | ICD-10-CM

## 2022-05-28 HISTORY — DX: Partial loss of teeth, unspecified cause, unspecified class: K08.409

## 2022-05-31 NOTE — Progress Notes (Signed)
Left patient voicemail on 05-30-2022 and 05-31-2022 please call your cardiology office to confirm plavix is to be stopped 3 days before surgery and voicemail message left to return my call to confirm this message was received.

## 2022-06-01 NOTE — Progress Notes (Unsigned)
Cardiology Office Note:    Date:  06/04/2022   ID:  MAJESTIC BRISTER, DOB 1951/04/17, MRN [id number]  PCP:  Audley Hose, MD   [facility] Providers Cardiologist:  Sinclair Grooms, MD     Referring MD: Audley Hose, MD   Chief Complaint: Preoperative cardiac evaluation  History of Present Illness:    John Finley is a very pleasant  71 y.o. male with a hx of aortic and coronary atherosclerosis noted on CT.  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  He was last seen in our office on 01/09/2021 by Dr. Tamala Julian. BP was elevated. He reported nocturia on spironolactone. Wanted to work on better diet, exercise, and weight loss prior to adding additional antihypertensives.  He was asked to send BP readings into [place] for follow-up.   Today, he is here for preoperative cardiac evaluation for upcoming seed implant for recently diagnosed prostate cancer.  Reports he has overall been doing well.  Had to stop walking due to hip pain but remains active in his garden. Pulled a tick off a few days ago, no rash or bullseye. Has leg pain on occasion but does not believe it is claudication.  Has been told by PCP that symptoms may be related to spinal stenosis. He denies chest pain, shortness of breath, lower extremity edema, fatigue, palpitations, melena, hematuria, hemoptysis, diaphoresis, weakness, presyncope, syncope, orthopnea, and PND.  Past Medical History:  Diagnosis Date   Arrhythmia    Cervical spondylosis without myelopathy    Chronic low back pain    Colon polyp    Gastric ulcer    Gastric ulcer    Herniated cervical disc    Herpes zoster    High cholesterol    Hypertension    Kidney stone    Minimal cognitive impairment    OSA (obstructive sleep apnea)    Prediabetes    Seasonal allergic rhinitis    Sleep apnea    Spinal stenosis of lumbar region    Stroke (cerebrum) ([place])     Type 2 diabetes mellitus ([place])    Vitamin D deficiency     Past Surgical History:  Procedure Laterality Date   ANKLE SURGERY     HERNIA REPAIR     TEE WITHOUT CARDIOVERSION N/A 02/19/2017   Procedure: TRANSESOPHAGEAL ECHOCARDIOGRAM (TEE);  Surgeon: Dorothy Spark, MD;  Location: [facility];  Service: Cardiovascular;  Laterality: N/A;    Current Medications: Current Meds  Medication Sig   acyclovir (ZOVIRAX) 400 MG tablet Take 400 mg by mouth 2 (two) times daily.   amLODipine (NORVASC) 10 MG tablet Take 10 mg by mouth daily.    chlorhexidine (PERIDEX) 0.12 % solution Use as directed in the mouth or throat 2 (two) times daily.   clopidogrel (PLAVIX) 75 MG tablet Take 1 tablet (75 mg total) by mouth daily.   Empagliflozin-metFORMIN HCl ER (SYNJARDY XR) 09-999 MG TB24 Synjardy XR 10 mg-1,000 mg tablet, extended release   irbesartan (AVAPRO) 300 MG tablet Take 300 mg by mouth daily.   rosuvastatin (CRESTOR) 20 MG tablet Take 20 mg by mouth daily.    spironolactone (ALDACTONE) 50 MG tablet Take 50 mg by mouth daily.   tadalafil (CIALIS) 5 MG tablet Take 5 mg by mouth daily.   [DISCONTINUED] spironolactone (ALDACTONE) 25 MG tablet Take 1 tablet (25 mg total) by mouth daily.     Allergies:  Penicillins and Sulfa antibiotics   Social History   Socioeconomic History   Marital status: Married    Spouse name: Not on file   Number of children: 2   Years of education: MD   Highest education level: Not on file  Occupational History   Occupation: Cardiologist  Tobacco Use   Smoking status: Former    Packs/day: 2.00    Years: 30.00    Total pack years: 60.00    Types: Cigarettes    Quit date: 2006    Years since quitting: 17.4   Smokeless tobacco: Never  Vaping Use   Vaping Use: Never used  Substance and Sexual Activity   Alcohol use: No    Comment: Rare   Drug use: No   Sexual activity: Not on file  Other Topics Concern   Not on file  Social History Narrative   Lives  at home w/ his wife   Right-handed   Caffeine: 1 cup coffee per day      Social Determinants of Health   Financial Resource Strain: Not on file  Food Insecurity: Not on file  Transportation Needs: Not on file  Physical Activity: Not on file  Stress: Not on file  Social Connections: Not on file     Family History: The patient's family history includes Alcohol abuse in his mother; Cancer - Other in his mother; Depression in his brother; Diabetes in his brother, brother, and mother; Heart disease in his mother; Hypertension in his brother, mother, and sister; Other in his father; Pulmonary embolism in his sister; Stroke in his brother and mother.  ROS:   Please see the history of present illness.  All other systems reviewed and are negative.  Labs/Other Studies Reviewed:    The following studies were reviewed today:  Echo 12/20/2020  1. Small area of apical hypokinesis noted on contrast images; LV function  overall hyperdynamic; mild LVH and proximal septal thickening; grade 1  diastolic dysfunction.   2. Left ventricular ejection fraction, by estimation, is >75%. The left  ventricle has hyperdynamic function. The left ventricle demonstrates  regional wall motion abnormalities (see scoring diagram/findings for  description). There is mild left  ventricular hypertrophy. Left ventricular diastolic parameters are  consistent with Grade I diastolic dysfunction (impaired relaxation).   3. Right ventricular systolic function is normal. The right ventricular  size is normal.   4. The mitral valve is normal in structure. No evidence of mitral valve  regurgitation. No evidence of mitral stenosis.   5. The aortic valve is tricuspid. Aortic valve regurgitation is not  visualized. Mild aortic valve sclerosis is present, with no evidence of  aortic valve stenosis.   6. The inferior vena cava is normal in size with greater than 50%  respiratory variability, suggesting right atrial pressure  of 3 mmHg.   Cardiac monitor 03/2017  Sinus rhythm Frequent premature atrial contractions No sustained arrhythmias I do not see atrial fibrillation, though baseline artifact limits interpretation at times  Recent Labs: No results found for requested labs within last 365 days.  Recent Lipid Panel    Component Value Date/Time   CHOL 178 02/17/2017 0444   TRIG 104 02/17/2017 0444   HDL 32 (L) 02/17/2017 0444   CHOLHDL 5.6 02/17/2017 0444   VLDL 21 02/17/2017 0444   LDLCALC 125 (H) 02/17/2017 0444     Risk Assessment/Calculations:      Physical Exam:    VS:  BP 118/72 (BP Location: Left Arm, Patient Position: Sitting,  Cuff Size: Normal)   Pulse 75   Ht '5\' 6"'$  (1.676 m)   Wt 218 lb 6.4 oz (99.1 kg)   SpO2 94%   BMI 35.25 kg/m     Wt Readings from Last 3 Encounters:  06/04/22 218 lb 6.4 oz (99.1 kg)  04/18/22 220 lb (99.8 kg)  02/22/22 222 lb 8 oz (100.9 kg)     GEN: Well developed obese gentleman in no acute distress HEENT: Normal NECK: No JVD; No carotid bruits CARDIAC: RRR, no murmurs, rubs, gallops RESPIRATORY:  Clear to auscultation without rales, wheezing or rhonchi  ABDOMEN: Soft, non-tender, non-distended MUSCULOSKELETAL:  No edema; No deformity. 2+ pedal pulses, equal bilaterally SKIN: Warm and dry NEUROLOGIC:  Alert and oriented x 3 PSYCHIATRIC:  Normal affect   EKG:  EKG is  ordered today.  The ekg ordered today demonstrates sinus rhythm at 75 bpm with 1st degree AV block, no significant change from previous  Diagnoses:    1. Coronary artery disease involving native coronary artery of native heart without angina pectoris   2. Hypertension, unspecified type   3. LVH (left ventricular hypertrophy)   4. Aortic atherosclerosis ([place])   5. Preoperative cardiovascular examination   6. Obstructive sleep apnea   7. Hyperlipidemia LDL goal <70    Assessment and Plan:     Preoperative cardiac evaluation: He is doing well from a cardiac perspective and may  proceed to surgery without further testing.  He is holding his Plavix for 1 week prior to surgery per his own discretion. No coronary indication for Plavix.  According to the Revised Cardiac Risk Index (RCRI), his Perioperative Risk of Major Cardiac Event is (%): 0.9. His Functional Capacity in METs is: 7.59 according to the [facility] Activity Status Index (DASI). Will forward clearance to Dr. Zettie Pho office.   OSA: On CPAP for many years. Reports his mask is leaking. Encouraged him to follow-up with sleep medicine provider.  Hypertension/LVH on echo: BP is well controlled today.  He reports similar readings at home recently.  We will continue his current medications.   CAD without angina: He reports cardiac catheterization 2006 with nonobstructive disease.  Aortic and coronary calcification seen on CT imaging.  He has been on Plavix since stroke approximately 5 years ago. No bleeding problems.   Hyperlipidemia: Monitored by PCP. Continue rosuvastatin.   Disposition: 1 year with Dr. Tamala Julian   Medication Adjustments/Labs and Tests Ordered: Current medicines are reviewed at length with the patient today.  Concerns regarding medicines are outlined above.  Orders Placed This Encounter  Procedures   EKG 12-Lead   No orders of the defined types were placed in this encounter.   Patient Instructions  Medication Instructions:  Your physician recommends that you continue on your current medications as directed. Please refer to the Current Medication list given to you today.  *If you need a refill on your cardiac medications before your next appointment, please call your pharmacy*   Lab Work: None ordered If you have labs (blood work) drawn today and your tests are completely normal, you will receive your results only by: [place] (if you have MyChart) OR A paper copy in the mail If you have any lab test that is abnormal or we need to change your treatment, we will call you to review the  results.   Testing/Procedures: None Ordered   Follow-Up: At [facility], you and your health needs are our priority.  As part of our continuing mission to provide you  with exceptional heart care, we have created designated Provider Care Teams.  These Care Teams include your primary Cardiologist (physician) and Advanced Practice Providers (APPs -  Physician Assistants and Nurse Practitioners) who all work together to provide you with the care you need, when you need it.  We recommend signing up for the patient portal called "MyChart".  Sign up information is provided on this After Visit Summary.  MyChart is used to connect with patients for Virtual Visits (Telemedicine).  Patients are able to view lab/test results, encounter notes, upcoming appointments, etc.  Non-urgent messages can be sent to your provider as well.   To learn more about what you can do with MyChart, go to [email or web address].    Your next appointment:   1 year(s)  The format for your next appointment:   In Person  Provider:   Sinclair Grooms, MD     Other Instructions  Important Information About Sugar         Signed, Emmaline Life, NP  06/04/2022 5:40 PM    [place]

## 2022-06-04 ENCOUNTER — Encounter: Payer: Self-pay | Admitting: Nurse Practitioner

## 2022-06-04 ENCOUNTER — Ambulatory Visit (INDEPENDENT_AMBULATORY_CARE_PROVIDER_SITE_OTHER): Payer: Medicare Other | Admitting: Nurse Practitioner

## 2022-06-04 VITALS — BP 118/72 | HR 75 | Ht 66.0 in | Wt 218.4 lb

## 2022-06-04 DIAGNOSIS — Z01810 Encounter for preprocedural cardiovascular examination: Secondary | ICD-10-CM

## 2022-06-04 DIAGNOSIS — I10 Essential (primary) hypertension: Secondary | ICD-10-CM | POA: Diagnosis not present

## 2022-06-04 DIAGNOSIS — E785 Hyperlipidemia, unspecified: Secondary | ICD-10-CM

## 2022-06-04 DIAGNOSIS — I2510 Atherosclerotic heart disease of native coronary artery without angina pectoris: Secondary | ICD-10-CM

## 2022-06-04 DIAGNOSIS — I700 Atherosclerosis of aorta: Secondary | ICD-10-CM | POA: Diagnosis not present

## 2022-06-04 DIAGNOSIS — I517 Cardiomegaly: Secondary | ICD-10-CM

## 2022-06-04 DIAGNOSIS — G4733 Obstructive sleep apnea (adult) (pediatric): Secondary | ICD-10-CM

## 2022-06-04 NOTE — Patient Instructions (Signed)
Medication Instructions:  Your physician recommends that you continue on your current medications as directed. Please refer to the Current Medication list given to you today.  *If you need a refill on your cardiac medications before your next appointment, please call your pharmacy*   Lab Work: None ordered If you have labs (blood work) drawn today and your tests are completely normal, you will receive your results only by: [place] (if you have MyChart) OR A paper copy in the mail If you have any lab test that is abnormal or we need to change your treatment, we will call you to review the results.   Testing/Procedures: None Ordered   Follow-Up: At [facility], you and your health needs are our priority.  As part of our continuing mission to provide you with exceptional heart care, we have created designated Provider Care Teams.  These Care Teams include your primary Cardiologist (physician) and Advanced Practice Providers (APPs -  Physician Assistants and Nurse Practitioners) who all work together to provide you with the care you need, when you need it.  We recommend signing up for the patient portal called "MyChart".  Sign up information is provided on this After Visit Summary.  MyChart is used to connect with patients for Virtual Visits (Telemedicine).  Patients are able to view lab/test results, encounter notes, upcoming appointments, etc.  Non-urgent messages can be sent to your provider as well.   To learn more about what you can do with MyChart, go to [email or web address].    Your next appointment:   1 year(s)  The format for your next appointment:   In Person  Provider:   Sinclair Grooms, MD     Other Instructions  Important Information About Sugar

## 2022-06-05 ENCOUNTER — Other Ambulatory Visit: Payer: Self-pay

## 2022-06-05 ENCOUNTER — Encounter (HOSPITAL_BASED_OUTPATIENT_CLINIC_OR_DEPARTMENT_OTHER): Payer: Self-pay | Admitting: Urology

## 2022-06-05 NOTE — Progress Notes (Addendum)
Spoke w/ via phone for pre-op interview---pt Lab needs dos---- I stat              Lab results------see below COVID test -----patient states asymptomatic no test needed Arrive at -------730 am 06-08-2022 NPO after MN NO Solid Food.  Clear liquids from MN until---630 am Med rec completed Medications to take morning of surgery -----Amlodipine, rosuvastatin, cialis Diabetic medication -----n/a Patient instructed no nail polish to be worn day of surgery Patient instructed to bring photo id and insurance card day of surgery Patient aware to have Driver (ride ) / caregiver   wife Lemmie Evens will stay  for 24 hours after surgery  Patient Special Instructions ----bring cpap mask, tubing and machine and leave in car Pre-Op special Istructions -----fleets enema hs night before surgery Patient verbalized understanding of instructions that were given at this phone interview. Patient denies shortness of breath, chest pain, fever, cough at this phone interview.   Cardiac clearance note Christen Bame NP 05-26-2022 chart/epic Ekg 06-04-2022 chart/epic Echo 12-20-2020  LVEF > 75% epic Cardiac monitor 04-05-2017 epic Patient stopped plavix 7 days before surgery per patient choice last dose was 06-01-2022  Pt seeing dentist 06-06-2022  @ 430 pm for tooth pain, not sure if dry socket  Tick bite right epigastric region area red per patient   Spoke with dr c Glennon Mac mda and made aware pt seeing dentist tomorrow for possible dry socket after tooth extraction, this is not issue for anesthesia, but make dr Tresa Moore aware if patient has active tooth infection per dr c Glennon Mac mda.  Addendum: spoke with patient by phone and tooth has no signs of infection and healing well pre patient dental appointment  with dentist on 06-06-2022.

## 2022-06-08 ENCOUNTER — Ambulatory Visit (HOSPITAL_BASED_OUTPATIENT_CLINIC_OR_DEPARTMENT_OTHER)
Admission: RE | Admit: 2022-06-08 | Discharge: 2022-06-08 | Disposition: A | Discharge status: Home / Self Care | Payer: Medicare Other | Attending: Urology | Admitting: Urology

## 2022-06-08 ENCOUNTER — Other Ambulatory Visit: Payer: Self-pay

## 2022-06-08 ENCOUNTER — Encounter (HOSPITAL_BASED_OUTPATIENT_CLINIC_OR_DEPARTMENT_OTHER)
Admission: RE | Disposition: A | Discharge status: Home / Self Care | Payer: Self-pay | Source: Home / Self Care | Attending: Urology

## 2022-06-08 ENCOUNTER — Encounter (HOSPITAL_BASED_OUTPATIENT_CLINIC_OR_DEPARTMENT_OTHER): Payer: Self-pay | Admitting: Urology

## 2022-06-08 ENCOUNTER — Ambulatory Visit (HOSPITAL_BASED_OUTPATIENT_CLINIC_OR_DEPARTMENT_OTHER): Payer: Medicare Other | Admitting: Certified Registered Nurse Anesthetist

## 2022-06-08 DIAGNOSIS — Z01818 Encounter for other preprocedural examination: Secondary | ICD-10-CM

## 2022-06-08 DIAGNOSIS — E119 Type 2 diabetes mellitus without complications: Secondary | ICD-10-CM | POA: Diagnosis not present

## 2022-06-08 DIAGNOSIS — C61 Malignant neoplasm of prostate: Secondary | ICD-10-CM

## 2022-06-08 DIAGNOSIS — Z87891 Personal history of nicotine dependence: Secondary | ICD-10-CM | POA: Insufficient documentation

## 2022-06-08 DIAGNOSIS — N400 Enlarged prostate without lower urinary tract symptoms: Secondary | ICD-10-CM | POA: Insufficient documentation

## 2022-06-08 DIAGNOSIS — I10 Essential (primary) hypertension: Secondary | ICD-10-CM | POA: Insufficient documentation

## 2022-06-08 DIAGNOSIS — I6930 Unspecified sequelae of cerebral infarction: Secondary | ICD-10-CM | POA: Diagnosis not present

## 2022-06-08 DIAGNOSIS — I6990 Unspecified sequelae of unspecified cerebrovascular disease: Secondary | ICD-10-CM

## 2022-06-08 DIAGNOSIS — Z7984 Long term (current) use of oral hypoglycemic drugs: Secondary | ICD-10-CM

## 2022-06-08 DIAGNOSIS — Z79899 Other long term (current) drug therapy: Secondary | ICD-10-CM | POA: Diagnosis not present

## 2022-06-08 DIAGNOSIS — G4730 Sleep apnea, unspecified: Secondary | ICD-10-CM | POA: Diagnosis not present

## 2022-06-08 HISTORY — DX: Atherosclerotic heart disease of native coronary artery without angina pectoris: I25.10

## 2022-06-08 HISTORY — DX: Presence of dental prosthetic device (complete) (partial): Z97.2

## 2022-06-08 HISTORY — DX: Bitten or stung by nonvenomous insect and other nonvenomous arthropods, initial encounter: W57.XXXA

## 2022-06-08 HISTORY — PX: SPACE OAR INSTILLATION: SHX6769

## 2022-06-08 HISTORY — DX: Malignant neoplasm of prostate: C61

## 2022-06-08 HISTORY — DX: Prediabetes: R73.03

## 2022-06-08 HISTORY — PX: GOLD SEED IMPLANT: SHX6343

## 2022-06-08 HISTORY — DX: Benign prostatic hyperplasia without lower urinary tract symptoms: N40.0

## 2022-06-08 HISTORY — DX: Cardiomegaly: I51.7

## 2022-06-08 LAB — POCT I-STAT, CHEM 8
BUN: 20 mg/dL (ref 8–23)
Calcium, Ion: 1.2 mmol/L (ref 1.15–1.40)
Chloride: 103 mmol/L (ref 98–111)
Creatinine, Ser: 1.2 mg/dL (ref 0.61–1.24)
Glucose, Bld: 122 mg/dL — ABNORMAL HIGH (ref 70–99)
HCT: 43 % (ref 39.0–52.0)
Hemoglobin: 14.6 g/dL (ref 13.0–17.0)
Potassium: 4.4 mmol/L (ref 3.5–5.1)
Sodium: 138 mmol/L (ref 135–145)
TCO2: 23 mmol/L (ref 22–32)

## 2022-06-08 LAB — GLUCOSE, CAPILLARY: Glucose-Capillary: 133 mg/dL — ABNORMAL HIGH (ref 70–99)

## 2022-06-08 SURGERY — INSERTION, GOLD SEEDS
Anesthesia: Monitor Anesthesia Care | Site: Prostate

## 2022-06-08 MED ORDER — PROPOFOL 500 MG/50ML IV EMUL
INTRAVENOUS | Status: DC | PRN
  Administered 2022-06-08: 150 ug/kg/min via INTRAVENOUS

## 2022-06-08 MED ORDER — FENTANYL CITRATE (PF) 250 MCG/5ML IJ SOLN
INTRAMUSCULAR | Status: DC | PRN
Start: 2022-06-08 — End: 2022-06-08
  Administered 2022-06-08: 50 ug via INTRAVENOUS

## 2022-06-08 MED ORDER — OXYCODONE HCL 5 MG PO TABS: 5.0000 mg | ORAL_TABLET | Freq: Once | ORAL | Status: DC | PRN

## 2022-06-08 MED ORDER — ACETAMINOPHEN 160 MG/5ML PO SOLN: 325.0000 mg | ORAL | Status: DC | PRN

## 2022-06-08 MED ORDER — FLEET ENEMA 7-19 GM/118ML RE ENEM: 1.0000 | ENEMA | Freq: Once | RECTAL | Status: DC

## 2022-06-08 MED ORDER — ACETAMINOPHEN 325 MG PO TABS: 325.0000 mg | ORAL_TABLET | ORAL | Status: DC | PRN

## 2022-06-08 MED ORDER — MIDAZOLAM HCL 2 MG/2ML IJ SOLN
INTRAMUSCULAR | Status: AC
  Filled 2022-06-08: qty 2

## 2022-06-08 MED ORDER — ONDANSETRON HCL 4 MG/2ML IJ SOLN: 4.0000 mg | Freq: Once | INTRAMUSCULAR | Status: DC | PRN

## 2022-06-08 MED ORDER — ONDANSETRON HCL 4 MG/2ML IJ SOLN
INTRAMUSCULAR | Status: DC | PRN
  Administered 2022-06-08: 4 mg via INTRAVENOUS

## 2022-06-08 MED ORDER — FENTANYL CITRATE (PF) 100 MCG/2ML IJ SOLN
INTRAMUSCULAR | Status: AC
  Filled 2022-06-08: qty 2

## 2022-06-08 MED ORDER — SODIUM CHLORIDE (PF) 0.9 % IJ SOLN
INTRAMUSCULAR | Status: DC | PRN
  Administered 2022-06-08: 10 mL

## 2022-06-08 MED ORDER — OXYCODONE HCL 5 MG/5ML PO SOLN: 5.0000 mg | Freq: Once | ORAL | Status: DC | PRN

## 2022-06-08 MED ORDER — CEFAZOLIN SODIUM-DEXTROSE 2-4 GM/100ML-% IV SOLN
INTRAVENOUS | Status: AC
  Filled 2022-06-08: qty 100

## 2022-06-08 MED ORDER — PROPOFOL 10 MG/ML IV BOLUS
INTRAVENOUS | Status: DC | PRN
  Administered 2022-06-08: 20 mg via INTRAVENOUS

## 2022-06-08 MED ORDER — CEFAZOLIN SODIUM-DEXTROSE 2-3 GM-%(50ML) IV SOLR
INTRAVENOUS | Status: DC | PRN
  Administered 2022-06-08: 2 g via INTRAVENOUS

## 2022-06-08 MED ORDER — FENTANYL CITRATE (PF) 100 MCG/2ML IJ SOLN
25.0000 ug | INTRAMUSCULAR | Status: DC | PRN
  Administered 2022-06-08: 25 ug via INTRAVENOUS

## 2022-06-08 MED ORDER — LACTATED RINGERS IV SOLN: INTRAVENOUS | Status: DC

## 2022-06-08 MED ORDER — LIDOCAINE 2% (20 MG/ML) 5 ML SYRINGE
INTRAMUSCULAR | Status: DC | PRN
  Administered 2022-06-08: 40 mg via INTRAVENOUS

## 2022-06-08 SURGICAL SUPPLY — 26 items
BLADE CLIPPER SENSICLIP SURGIC (BLADE) ×2 IMPLANT
CNTNR URN SCR LID CUP LEK RST (MISCELLANEOUS) ×1 IMPLANT
CONT SPEC 4OZ STRL OR WHT (MISCELLANEOUS) ×2
COVER BACK TABLE 60X90IN (DRAPES) ×2 IMPLANT
DRSG TEGADERM 4X4.75 (GAUZE/BANDAGES/DRESSINGS) ×1 IMPLANT
DRSG TEGADERM 8X12 (GAUZE/BANDAGES/DRESSINGS) ×2 IMPLANT
GAUZE SPONGE 4X4 12PLY STRL (GAUZE/BANDAGES/DRESSINGS) ×1 IMPLANT
GLOVE BIO SURGEON STRL SZ7.5 (GLOVE) ×2 IMPLANT
GLOVE BIOGEL PI IND STRL 7.0 (GLOVE) IMPLANT
GLOVE BIOGEL PI INDICATOR 7.0 (GLOVE) ×1
GLOVE SURG ORTHO 8.5 STRL (GLOVE) ×1 IMPLANT
IMPL SPACEOAR VUE SYSTEM (Spacer) ×1 IMPLANT
IMPLANT SPACEOAR VUE SYSTEM (Spacer) ×2 IMPLANT
KIT TURNOVER CYSTO (KITS) ×2 IMPLANT
MARKER GOLD PRELOAD 1.2X3 (Urological Implant) ×1 IMPLANT
MARKER SKIN DUAL TIP RULER LAB (MISCELLANEOUS) ×1 IMPLANT
NDL SPNL 22GX3.5 QUINCKE BK (NEEDLE) ×1 IMPLANT
NEEDLE SPNL 22GX3.5 QUINCKE BK (NEEDLE) IMPLANT
SEED GOLD PRELOAD 1.2X3 (Urological Implant) ×2 IMPLANT
SHEATH ULTRASOUND LF (SHEATH) IMPLANT
SHEATH ULTRASOUND LTX NONSTRL (SHEATH) ×1 IMPLANT
SURGILUBE 2OZ TUBE FLIPTOP (MISCELLANEOUS) ×2 IMPLANT
SYR 10ML LL (SYRINGE) ×1 IMPLANT
SYR CONTROL 10ML LL (SYRINGE) ×1 IMPLANT
TOWEL OR 17X26 10 PK STRL BLUE (TOWEL DISPOSABLE) ×1 IMPLANT
UNDERPAD 30X36 HEAVY ABSORB (UNDERPADS AND DIAPERS) ×2 IMPLANT

## 2022-06-08 NOTE — Anesthesia Postprocedure Evaluation (Addendum)
Anesthesia Post Note  Patient: John Finley  Procedure(s) Performed: GOLD SEED IMPLANT (Prostate) SPACE OAR INSTILLATION (Prostate)     Patient location during evaluation: PACU Anesthesia Type: MAC Level of consciousness: awake Pain management: pain level controlled Vital Signs Assessment: post-procedure vital signs reviewed and stable Respiratory status: spontaneous breathing Cardiovascular status: stable Postop Assessment: no apparent nausea or vomiting Anesthetic complications: no   No notable events documented.  Last Vitals:  Vitals:   06/08/22 0930 06/08/22 0945  BP: 119/68 113/72  Pulse: 66 67  Resp: (!) 9 14  Temp:    SpO2: 96% 95%    Last Pain:  Vitals:   06/08/22 0930  TempSrc:   PainSc: 4                  John F Scharlene Corn

## 2022-06-11 ENCOUNTER — Telehealth: Payer: Self-pay | Admitting: *Deleted

## 2022-06-11 ENCOUNTER — Encounter (HOSPITAL_BASED_OUTPATIENT_CLINIC_OR_DEPARTMENT_OTHER): Payer: Self-pay | Admitting: Urology

## 2022-06-12 ENCOUNTER — Ambulatory Visit
Admission: RE | Admit: 2022-06-12 | Discharge: 2022-06-12 | Disposition: A | Discharge status: Home / Self Care | Payer: Medicare Other | Source: Ambulatory Visit | Attending: Radiation Oncology | Admitting: Radiation Oncology

## 2022-06-12 ENCOUNTER — Other Ambulatory Visit: Payer: Self-pay

## 2022-06-12 DIAGNOSIS — C61 Malignant neoplasm of prostate: Secondary | ICD-10-CM | POA: Diagnosis present

## 2022-06-12 NOTE — Progress Notes (Signed)
  Radiation Oncology         ([phone]) [phone] ________________________________  Name: John Finley MRN: [id number]  Date: 06/12/2022  DOB: September 06, 1951  SIMULATION AND TREATMENT PLANNING NOTE    ICD-10-CM   1. Malignant neoplasm of prostate ([place])  C61       DIAGNOSIS:   71 y.o. 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  NARRATIVE:  The patient was brought to the [facility].  Identity was confirmed.  All relevant records and images related to the planned course of therapy were reviewed.  The patient freely provided informed written consent to proceed with treatment after reviewing the details related to the planned course of therapy. The consent form was witnessed and verified by the simulation staff.  Then, the patient was set-up in a stable reproducible supine position for radiation therapy.  A vacuum lock pillow device was custom fabricated to position his legs in a reproducible immobilized position.  Then, I performed a urethrogram under sterile conditions to identify the prostatic bed.  CT images were obtained.  Surface markings were placed.  The CT images were loaded into the planning software.  Then the prostate bed target, pelvic lymph node target and avoidance structures including the rectum, bladder, bowel and hips were contoured.  Treatment planning then occurred.  The radiation prescription was entered and confirmed.  A total of one complex treatment devices were fabricated. I have requested : Intensity Modulated Radiotherapy (IMRT) is medically necessary for this case for the following reason:  Rectal sparing.Marland Kitchen  PLAN:  The patient will receive 45 Gy in 25 fractions of 1.8 Gy, followed by a boost to the prostate to a total dose of 75 Gy with 15 additional fractions of 2 Gy.   ________________________________  Sheral Apley Tammi Klippel, M.D.

## 2022-06-13 ENCOUNTER — Ambulatory Visit: Payer: Medicare Other | Admitting: Radiation Oncology

## 2022-06-14 ENCOUNTER — Ambulatory Visit: Payer: Medicare Other | Admitting: Radiation Oncology

## 2022-06-20 DIAGNOSIS — C61 Malignant neoplasm of prostate: Secondary | ICD-10-CM | POA: Insufficient documentation

## 2022-06-25 ENCOUNTER — Other Ambulatory Visit: Payer: Self-pay

## 2022-06-25 ENCOUNTER — Ambulatory Visit
Admission: RE | Admit: 2022-06-25 | Discharge: 2022-06-25 | Disposition: A | Discharge status: Home / Self Care | Payer: Medicare Other | Source: Ambulatory Visit | Attending: Radiation Oncology | Admitting: Radiation Oncology

## 2022-06-25 DIAGNOSIS — C61 Malignant neoplasm of prostate: Secondary | ICD-10-CM | POA: Diagnosis not present

## 2022-06-25 LAB — RAD ONC ARIA SESSION SUMMARY
Course Elapsed Days: 0
Plan Fractions Treated to Date: 1
Plan Prescribed Dose Per Fraction: 1.8 Gy
Plan Total Fractions Prescribed: 25
Plan Total Prescribed Dose: 45 Gy
Reference Point Dosage Given to Date: 1.8 Gy
Reference Point Session Dosage Given: 1.8 Gy
Session Number: 1

## 2022-06-26 ENCOUNTER — Other Ambulatory Visit: Payer: Self-pay

## 2022-06-26 ENCOUNTER — Ambulatory Visit
Admission: RE | Admit: 2022-06-26 | Discharge: 2022-06-26 | Disposition: A | Discharge status: Home / Self Care | Payer: Medicare Other | Source: Ambulatory Visit | Attending: Radiation Oncology | Admitting: Radiation Oncology

## 2022-06-26 DIAGNOSIS — C61 Malignant neoplasm of prostate: Secondary | ICD-10-CM | POA: Diagnosis not present

## 2022-06-26 LAB — RAD ONC ARIA SESSION SUMMARY
Course Elapsed Days: 1
Plan Fractions Treated to Date: 2
Plan Prescribed Dose Per Fraction: 1.8 Gy
Plan Total Fractions Prescribed: 25
Plan Total Prescribed Dose: 45 Gy
Reference Point Dosage Given to Date: 3.6 Gy
Reference Point Session Dosage Given: 1.8 Gy
Session Number: 2

## 2022-06-27 ENCOUNTER — Other Ambulatory Visit: Payer: Self-pay

## 2022-06-27 ENCOUNTER — Ambulatory Visit
Admission: RE | Admit: 2022-06-27 | Discharge: 2022-06-27 | Disposition: A | Discharge status: Home / Self Care | Payer: Medicare Other | Source: Ambulatory Visit | Attending: Radiation Oncology | Admitting: Radiation Oncology

## 2022-06-27 DIAGNOSIS — C61 Malignant neoplasm of prostate: Secondary | ICD-10-CM | POA: Diagnosis not present

## 2022-06-27 LAB — RAD ONC ARIA SESSION SUMMARY
Course Elapsed Days: 2
Plan Fractions Treated to Date: 3
Plan Prescribed Dose Per Fraction: 1.8 Gy
Plan Total Fractions Prescribed: 25
Plan Total Prescribed Dose: 45 Gy
Reference Point Dosage Given to Date: 5.4 Gy
Reference Point Session Dosage Given: 1.8 Gy
Session Number: 3

## 2022-06-28 ENCOUNTER — Ambulatory Visit
Admission: RE | Admit: 2022-06-28 | Discharge: 2022-06-28 | Disposition: A | Discharge status: Home / Self Care | Payer: Medicare Other | Source: Ambulatory Visit | Attending: Radiation Oncology | Admitting: Radiation Oncology

## 2022-06-28 ENCOUNTER — Other Ambulatory Visit: Payer: Self-pay

## 2022-06-28 DIAGNOSIS — C61 Malignant neoplasm of prostate: Secondary | ICD-10-CM | POA: Diagnosis not present

## 2022-06-28 LAB — RAD ONC ARIA SESSION SUMMARY
Course Elapsed Days: 3
Plan Fractions Treated to Date: 4
Plan Prescribed Dose Per Fraction: 1.8 Gy
Plan Total Fractions Prescribed: 25
Plan Total Prescribed Dose: 45 Gy
Reference Point Dosage Given to Date: 7.2 Gy
Reference Point Session Dosage Given: 1.8 Gy
Session Number: 4

## 2022-06-29 ENCOUNTER — Other Ambulatory Visit: Payer: Self-pay

## 2022-06-29 ENCOUNTER — Ambulatory Visit
Admission: RE | Admit: 2022-06-29 | Discharge: 2022-06-29 | Disposition: A | Discharge status: Home / Self Care | Payer: Medicare Other | Source: Ambulatory Visit | Attending: Radiation Oncology | Admitting: Radiation Oncology

## 2022-06-29 DIAGNOSIS — C61 Malignant neoplasm of prostate: Secondary | ICD-10-CM | POA: Diagnosis not present

## 2022-06-29 LAB — RAD ONC ARIA SESSION SUMMARY
Course Elapsed Days: 4
Plan Fractions Treated to Date: 5
Plan Prescribed Dose Per Fraction: 1.8 Gy
Plan Total Fractions Prescribed: 25
Plan Total Prescribed Dose: 45 Gy
Reference Point Dosage Given to Date: 9 Gy
Reference Point Session Dosage Given: 1.8 Gy
Session Number: 5

## 2022-07-02 ENCOUNTER — Ambulatory Visit
Admission: RE | Admit: 2022-07-02 | Discharge: 2022-07-02 | Disposition: A | Discharge status: Home / Self Care | Payer: Medicare Other | Source: Ambulatory Visit | Attending: Radiation Oncology | Admitting: Radiation Oncology

## 2022-07-02 ENCOUNTER — Other Ambulatory Visit: Payer: Self-pay

## 2022-07-02 DIAGNOSIS — C61 Malignant neoplasm of prostate: Secondary | ICD-10-CM | POA: Diagnosis not present

## 2022-07-02 LAB — RAD ONC ARIA SESSION SUMMARY
Course Elapsed Days: 7
Plan Fractions Treated to Date: 6
Plan Prescribed Dose Per Fraction: 1.8 Gy
Plan Total Fractions Prescribed: 25
Plan Total Prescribed Dose: 45 Gy
Reference Point Dosage Given to Date: 10.8 Gy
Reference Point Session Dosage Given: 1.8 Gy
Session Number: 6

## 2022-07-03 ENCOUNTER — Ambulatory Visit
Admission: RE | Admit: 2022-07-03 | Discharge: 2022-07-03 | Disposition: A | Discharge status: Home / Self Care | Payer: Medicare Other | Source: Ambulatory Visit | Attending: Radiation Oncology | Admitting: Radiation Oncology

## 2022-07-03 ENCOUNTER — Other Ambulatory Visit: Payer: Self-pay

## 2022-07-03 DIAGNOSIS — C61 Malignant neoplasm of prostate: Secondary | ICD-10-CM | POA: Diagnosis not present

## 2022-07-03 LAB — RAD ONC ARIA SESSION SUMMARY
Course Elapsed Days: 8
Plan Fractions Treated to Date: 7
Plan Prescribed Dose Per Fraction: 1.8 Gy
Plan Total Fractions Prescribed: 25
Plan Total Prescribed Dose: 45 Gy
Reference Point Dosage Given to Date: 12.6 Gy
Reference Point Session Dosage Given: 1.8 Gy
Session Number: 7

## 2022-07-04 ENCOUNTER — Ambulatory Visit
Admission: RE | Admit: 2022-07-04 | Discharge: 2022-07-04 | Disposition: A | Discharge status: Home / Self Care | Payer: Medicare Other | Source: Ambulatory Visit | Attending: Radiation Oncology | Admitting: Radiation Oncology

## 2022-07-04 ENCOUNTER — Other Ambulatory Visit: Payer: Self-pay

## 2022-07-04 DIAGNOSIS — C61 Malignant neoplasm of prostate: Secondary | ICD-10-CM | POA: Diagnosis not present

## 2022-07-04 LAB — RAD ONC ARIA SESSION SUMMARY
Course Elapsed Days: 9
Plan Fractions Treated to Date: 8
Plan Prescribed Dose Per Fraction: 1.8 Gy
Plan Total Fractions Prescribed: 25
Plan Total Prescribed Dose: 45 Gy
Reference Point Dosage Given to Date: 14.4 Gy
Reference Point Session Dosage Given: 1.8 Gy
Session Number: 8

## 2022-07-05 ENCOUNTER — Other Ambulatory Visit: Payer: Self-pay

## 2022-07-05 ENCOUNTER — Ambulatory Visit
Admission: RE | Admit: 2022-07-05 | Discharge: 2022-07-05 | Disposition: A | Discharge status: Home / Self Care | Payer: Medicare Other | Source: Ambulatory Visit | Attending: Radiation Oncology | Admitting: Radiation Oncology

## 2022-07-05 DIAGNOSIS — C61 Malignant neoplasm of prostate: Secondary | ICD-10-CM | POA: Diagnosis not present

## 2022-07-05 LAB — RAD ONC ARIA SESSION SUMMARY
Course Elapsed Days: 10
Plan Fractions Treated to Date: 9
Plan Prescribed Dose Per Fraction: 1.8 Gy
Plan Total Fractions Prescribed: 25
Plan Total Prescribed Dose: 45 Gy
Reference Point Dosage Given to Date: 16.2 Gy
Reference Point Session Dosage Given: 1.8 Gy
Session Number: 9

## 2022-07-06 ENCOUNTER — Other Ambulatory Visit: Payer: Self-pay

## 2022-07-06 ENCOUNTER — Ambulatory Visit
Admission: RE | Admit: 2022-07-06 | Discharge: 2022-07-06 | Disposition: A | Discharge status: Home / Self Care | Payer: Medicare Other | Source: Ambulatory Visit | Attending: Radiation Oncology | Admitting: Radiation Oncology

## 2022-07-06 DIAGNOSIS — C61 Malignant neoplasm of prostate: Secondary | ICD-10-CM | POA: Diagnosis not present

## 2022-07-06 LAB — RAD ONC ARIA SESSION SUMMARY
Course Elapsed Days: 11
Plan Fractions Treated to Date: 10
Plan Prescribed Dose Per Fraction: 1.8 Gy
Plan Total Fractions Prescribed: 25
Plan Total Prescribed Dose: 45 Gy
Reference Point Dosage Given to Date: 18 Gy
Reference Point Session Dosage Given: 1.8 Gy
Session Number: 10

## 2022-07-09 ENCOUNTER — Other Ambulatory Visit: Payer: Self-pay

## 2022-07-09 ENCOUNTER — Ambulatory Visit
Admission: RE | Admit: 2022-07-09 | Discharge: 2022-07-09 | Disposition: A | Discharge status: Home / Self Care | Payer: Medicare Other | Source: Ambulatory Visit | Attending: Radiation Oncology | Admitting: Radiation Oncology

## 2022-07-09 DIAGNOSIS — C61 Malignant neoplasm of prostate: Secondary | ICD-10-CM | POA: Diagnosis not present

## 2022-07-09 LAB — RAD ONC ARIA SESSION SUMMARY
Course Elapsed Days: 14
Plan Fractions Treated to Date: 11
Plan Prescribed Dose Per Fraction: 1.8 Gy
Plan Total Fractions Prescribed: 25
Plan Total Prescribed Dose: 45 Gy
Reference Point Dosage Given to Date: 19.8 Gy
Reference Point Session Dosage Given: 1.8 Gy
Session Number: 11

## 2022-07-10 ENCOUNTER — Ambulatory Visit
Admission: RE | Admit: 2022-07-10 | Discharge: 2022-07-10 | Disposition: A | Discharge status: Home / Self Care | Payer: Medicare Other | Source: Ambulatory Visit | Attending: Radiation Oncology | Admitting: Radiation Oncology

## 2022-07-10 ENCOUNTER — Other Ambulatory Visit: Payer: Self-pay

## 2022-07-10 DIAGNOSIS — C61 Malignant neoplasm of prostate: Secondary | ICD-10-CM | POA: Diagnosis not present

## 2022-07-10 LAB — RAD ONC ARIA SESSION SUMMARY
Course Elapsed Days: 15
Plan Fractions Treated to Date: 12
Plan Prescribed Dose Per Fraction: 1.8 Gy
Plan Total Fractions Prescribed: 25
Plan Total Prescribed Dose: 45 Gy
Reference Point Dosage Given to Date: 21.6 Gy
Reference Point Session Dosage Given: 1.8 Gy
Session Number: 12

## 2022-07-11 ENCOUNTER — Other Ambulatory Visit: Payer: Self-pay

## 2022-07-11 ENCOUNTER — Ambulatory Visit
Admission: RE | Admit: 2022-07-11 | Discharge: 2022-07-11 | Disposition: A | Discharge status: Home / Self Care | Payer: Medicare Other | Source: Ambulatory Visit | Attending: Radiation Oncology | Admitting: Radiation Oncology

## 2022-07-11 DIAGNOSIS — C61 Malignant neoplasm of prostate: Secondary | ICD-10-CM | POA: Diagnosis not present

## 2022-07-11 LAB — RAD ONC ARIA SESSION SUMMARY
Course Elapsed Days: 16
Plan Fractions Treated to Date: 13
Plan Prescribed Dose Per Fraction: 1.8 Gy
Plan Total Fractions Prescribed: 25
Plan Total Prescribed Dose: 45 Gy
Reference Point Dosage Given to Date: 23.4 Gy
Reference Point Session Dosage Given: 1.8 Gy
Session Number: 13

## 2022-07-12 ENCOUNTER — Ambulatory Visit
Admission: RE | Admit: 2022-07-12 | Discharge: 2022-07-12 | Disposition: A | Discharge status: Home / Self Care | Payer: Medicare Other | Source: Ambulatory Visit | Attending: Radiation Oncology | Admitting: Radiation Oncology

## 2022-07-12 ENCOUNTER — Other Ambulatory Visit: Payer: Self-pay

## 2022-07-12 DIAGNOSIS — C61 Malignant neoplasm of prostate: Secondary | ICD-10-CM | POA: Diagnosis not present

## 2022-07-12 LAB — RAD ONC ARIA SESSION SUMMARY
Course Elapsed Days: 17
Plan Fractions Treated to Date: 14
Plan Prescribed Dose Per Fraction: 1.8 Gy
Plan Total Fractions Prescribed: 25
Plan Total Prescribed Dose: 45 Gy
Reference Point Dosage Given to Date: 25.2 Gy
Reference Point Session Dosage Given: 1.8 Gy
Session Number: 14

## 2022-07-13 ENCOUNTER — Other Ambulatory Visit: Payer: Self-pay

## 2022-07-13 ENCOUNTER — Ambulatory Visit
Admission: RE | Admit: 2022-07-13 | Discharge: 2022-07-13 | Disposition: A | Discharge status: Home / Self Care | Payer: Medicare Other | Source: Ambulatory Visit | Attending: Radiation Oncology | Admitting: Radiation Oncology

## 2022-07-13 DIAGNOSIS — C61 Malignant neoplasm of prostate: Secondary | ICD-10-CM | POA: Diagnosis not present

## 2022-07-13 LAB — RAD ONC ARIA SESSION SUMMARY
Course Elapsed Days: 18
Plan Fractions Treated to Date: 15
Plan Prescribed Dose Per Fraction: 1.8 Gy
Plan Total Fractions Prescribed: 25
Plan Total Prescribed Dose: 45 Gy
Reference Point Dosage Given to Date: 27 Gy
Reference Point Session Dosage Given: 1.8 Gy
Session Number: 15

## 2022-07-16 ENCOUNTER — Ambulatory Visit: Payer: Medicare Other

## 2022-07-17 ENCOUNTER — Ambulatory Visit
Admission: RE | Admit: 2022-07-17 | Discharge: 2022-07-17 | Disposition: A | Discharge status: Home / Self Care | Payer: Medicare Other | Source: Ambulatory Visit | Attending: Radiation Oncology | Admitting: Radiation Oncology

## 2022-07-17 ENCOUNTER — Other Ambulatory Visit: Payer: Self-pay

## 2022-07-17 DIAGNOSIS — C61 Malignant neoplasm of prostate: Secondary | ICD-10-CM | POA: Diagnosis not present

## 2022-07-17 LAB — RAD ONC ARIA SESSION SUMMARY
Course Elapsed Days: 22
Plan Fractions Treated to Date: 16
Plan Prescribed Dose Per Fraction: 1.8 Gy
Plan Total Fractions Prescribed: 25
Plan Total Prescribed Dose: 45 Gy
Reference Point Dosage Given to Date: 28.8 Gy
Reference Point Session Dosage Given: 1.8 Gy
Session Number: 16

## 2022-07-18 ENCOUNTER — Ambulatory Visit
Admission: RE | Admit: 2022-07-18 | Discharge: 2022-07-18 | Disposition: A | Discharge status: Home / Self Care | Payer: Medicare Other | Source: Ambulatory Visit | Attending: Radiation Oncology | Admitting: Radiation Oncology

## 2022-07-18 ENCOUNTER — Other Ambulatory Visit: Payer: Self-pay

## 2022-07-18 DIAGNOSIS — C61 Malignant neoplasm of prostate: Secondary | ICD-10-CM | POA: Diagnosis not present

## 2022-07-18 LAB — RAD ONC ARIA SESSION SUMMARY
Course Elapsed Days: 23
Plan Fractions Treated to Date: 17
Plan Prescribed Dose Per Fraction: 1.8 Gy
Plan Total Fractions Prescribed: 25
Plan Total Prescribed Dose: 45 Gy
Reference Point Dosage Given to Date: 30.6 Gy
Reference Point Session Dosage Given: 1.8 Gy
Session Number: 17

## 2022-07-19 ENCOUNTER — Ambulatory Visit
Admission: RE | Admit: 2022-07-19 | Discharge: 2022-07-19 | Disposition: A | Discharge status: Home / Self Care | Payer: Medicare Other | Source: Ambulatory Visit | Attending: Radiation Oncology | Admitting: Radiation Oncology

## 2022-07-19 ENCOUNTER — Other Ambulatory Visit: Payer: Self-pay

## 2022-07-19 DIAGNOSIS — C61 Malignant neoplasm of prostate: Secondary | ICD-10-CM | POA: Diagnosis not present

## 2022-07-19 LAB — RAD ONC ARIA SESSION SUMMARY
Course Elapsed Days: 24
Plan Fractions Treated to Date: 18
Plan Prescribed Dose Per Fraction: 1.8 Gy
Plan Total Fractions Prescribed: 25
Plan Total Prescribed Dose: 45 Gy
Reference Point Dosage Given to Date: 32.4 Gy
Reference Point Session Dosage Given: 1.8 Gy
Session Number: 18

## 2022-07-20 ENCOUNTER — Ambulatory Visit
Admission: RE | Admit: 2022-07-20 | Discharge: 2022-07-20 | Disposition: A | Discharge status: Home / Self Care | Payer: Medicare Other | Source: Ambulatory Visit | Attending: Radiation Oncology | Admitting: Radiation Oncology

## 2022-07-20 ENCOUNTER — Other Ambulatory Visit: Payer: Self-pay

## 2022-07-20 ENCOUNTER — Other Ambulatory Visit: Payer: Self-pay | Admitting: Radiation Oncology

## 2022-07-20 DIAGNOSIS — C61 Malignant neoplasm of prostate: Secondary | ICD-10-CM | POA: Diagnosis not present

## 2022-07-20 LAB — RAD ONC ARIA SESSION SUMMARY
Course Elapsed Days: 25
Plan Fractions Treated to Date: 19
Plan Prescribed Dose Per Fraction: 1.8 Gy
Plan Total Fractions Prescribed: 25
Plan Total Prescribed Dose: 45 Gy
Reference Point Dosage Given to Date: 34.2 Gy
Reference Point Session Dosage Given: 1.8 Gy
Session Number: 19

## 2022-07-20 MED ORDER — ALFUZOSIN HCL ER 10 MG PO TB24: 10.0000 mg | ORAL_TABLET | Freq: Every day | ORAL | 5 refills | Status: DC

## 2022-07-23 ENCOUNTER — Other Ambulatory Visit: Payer: Self-pay

## 2022-07-23 ENCOUNTER — Ambulatory Visit
Admission: RE | Admit: 2022-07-23 | Discharge: 2022-07-23 | Disposition: A | Discharge status: Home / Self Care | Payer: Medicare Other | Source: Ambulatory Visit | Attending: Radiation Oncology | Admitting: Radiation Oncology

## 2022-07-23 DIAGNOSIS — C61 Malignant neoplasm of prostate: Secondary | ICD-10-CM | POA: Diagnosis not present

## 2022-07-23 LAB — RAD ONC ARIA SESSION SUMMARY
Course Elapsed Days: 28
Plan Fractions Treated to Date: 20
Plan Prescribed Dose Per Fraction: 1.8 Gy
Plan Total Fractions Prescribed: 25
Plan Total Prescribed Dose: 45 Gy
Reference Point Dosage Given to Date: 36 Gy
Reference Point Session Dosage Given: 1.8 Gy
Session Number: 20

## 2022-07-24 ENCOUNTER — Other Ambulatory Visit: Payer: Self-pay

## 2022-07-24 ENCOUNTER — Ambulatory Visit
Admission: RE | Admit: 2022-07-24 | Discharge: 2022-07-24 | Disposition: A | Discharge status: Home / Self Care | Payer: Medicare Other | Source: Ambulatory Visit | Attending: Radiation Oncology | Admitting: Radiation Oncology

## 2022-07-24 DIAGNOSIS — C61 Malignant neoplasm of prostate: Secondary | ICD-10-CM | POA: Diagnosis not present

## 2022-07-24 LAB — RAD ONC ARIA SESSION SUMMARY
Course Elapsed Days: 29
Plan Fractions Treated to Date: 21
Plan Prescribed Dose Per Fraction: 1.8 Gy
Plan Total Fractions Prescribed: 25
Plan Total Prescribed Dose: 45 Gy
Reference Point Dosage Given to Date: 37.8 Gy
Reference Point Session Dosage Given: 1.8 Gy
Session Number: 21

## 2022-07-25 ENCOUNTER — Other Ambulatory Visit: Payer: Self-pay

## 2022-07-25 ENCOUNTER — Ambulatory Visit
Admission: RE | Admit: 2022-07-25 | Discharge: 2022-07-25 | Disposition: A | Discharge status: Home / Self Care | Payer: Medicare Other | Source: Ambulatory Visit | Attending: Radiation Oncology | Admitting: Radiation Oncology

## 2022-07-25 DIAGNOSIS — C61 Malignant neoplasm of prostate: Secondary | ICD-10-CM | POA: Diagnosis not present

## 2022-07-25 LAB — RAD ONC ARIA SESSION SUMMARY
Course Elapsed Days: 30
Plan Fractions Treated to Date: 22
Plan Prescribed Dose Per Fraction: 1.8 Gy
Plan Total Fractions Prescribed: 25
Plan Total Prescribed Dose: 45 Gy
Reference Point Dosage Given to Date: 39.6 Gy
Reference Point Session Dosage Given: 1.8 Gy
Session Number: 22

## 2022-07-26 ENCOUNTER — Ambulatory Visit
Admission: RE | Admit: 2022-07-26 | Discharge: 2022-07-26 | Disposition: A | Discharge status: Home / Self Care | Payer: Medicare Other | Source: Ambulatory Visit | Attending: Radiation Oncology | Admitting: Radiation Oncology

## 2022-07-26 ENCOUNTER — Other Ambulatory Visit: Payer: Self-pay

## 2022-07-26 DIAGNOSIS — C61 Malignant neoplasm of prostate: Secondary | ICD-10-CM | POA: Diagnosis not present

## 2022-07-26 LAB — RAD ONC ARIA SESSION SUMMARY
Course Elapsed Days: 31
Plan Fractions Treated to Date: 23
Plan Prescribed Dose Per Fraction: 1.8 Gy
Plan Total Fractions Prescribed: 25
Plan Total Prescribed Dose: 45 Gy
Reference Point Dosage Given to Date: 41.4 Gy
Reference Point Session Dosage Given: 1.8 Gy
Session Number: 23

## 2022-07-27 ENCOUNTER — Ambulatory Visit
Admission: RE | Admit: 2022-07-27 | Discharge: 2022-07-27 | Disposition: A | Discharge status: Home / Self Care | Payer: Medicare Other | Source: Ambulatory Visit | Attending: Radiation Oncology | Admitting: Radiation Oncology

## 2022-07-27 ENCOUNTER — Other Ambulatory Visit: Payer: Self-pay

## 2022-07-27 DIAGNOSIS — C61 Malignant neoplasm of prostate: Secondary | ICD-10-CM | POA: Diagnosis not present

## 2022-07-27 LAB — RAD ONC ARIA SESSION SUMMARY
Course Elapsed Days: 32
Plan Fractions Treated to Date: 24
Plan Prescribed Dose Per Fraction: 1.8 Gy
Plan Total Fractions Prescribed: 25
Plan Total Prescribed Dose: 45 Gy
Reference Point Dosage Given to Date: 43.2 Gy
Reference Point Session Dosage Given: 1.8 Gy
Session Number: 24

## 2022-07-30 ENCOUNTER — Ambulatory Visit: Payer: Medicare Other

## 2022-07-30 ENCOUNTER — Other Ambulatory Visit: Payer: Self-pay

## 2022-07-30 DIAGNOSIS — C61 Malignant neoplasm of prostate: Secondary | ICD-10-CM | POA: Diagnosis not present

## 2022-07-30 LAB — RAD ONC ARIA SESSION SUMMARY
Course Elapsed Days: 35
Plan Fractions Treated to Date: 25
Plan Prescribed Dose Per Fraction: 1.8 Gy
Plan Total Fractions Prescribed: 25
Plan Total Prescribed Dose: 45 Gy
Reference Point Dosage Given to Date: 45 Gy
Reference Point Session Dosage Given: 1.8 Gy
Session Number: 25

## 2022-07-31 ENCOUNTER — Other Ambulatory Visit: Payer: Self-pay

## 2022-07-31 ENCOUNTER — Ambulatory Visit: Payer: Medicare Other

## 2022-07-31 DIAGNOSIS — C61 Malignant neoplasm of prostate: Secondary | ICD-10-CM | POA: Diagnosis not present

## 2022-07-31 LAB — RAD ONC ARIA SESSION SUMMARY
Course Elapsed Days: 36
Plan Fractions Treated to Date: 1
Plan Prescribed Dose Per Fraction: 2 Gy
Plan Total Fractions Prescribed: 15
Plan Total Prescribed Dose: 30 Gy
Reference Point Dosage Given to Date: 47 Gy
Reference Point Session Dosage Given: 2 Gy
Session Number: 26

## 2022-08-01 ENCOUNTER — Other Ambulatory Visit: Payer: Self-pay

## 2022-08-01 ENCOUNTER — Ambulatory Visit
Admission: RE | Admit: 2022-08-01 | Discharge: 2022-08-01 | Disposition: A | Discharge status: Home / Self Care | Payer: Medicare Other | Source: Ambulatory Visit | Attending: Radiation Oncology | Admitting: Radiation Oncology

## 2022-08-01 DIAGNOSIS — C61 Malignant neoplasm of prostate: Secondary | ICD-10-CM | POA: Diagnosis not present

## 2022-08-01 LAB — RAD ONC ARIA SESSION SUMMARY
Course Elapsed Days: 37
Plan Fractions Treated to Date: 2
Plan Prescribed Dose Per Fraction: 2 Gy
Plan Total Fractions Prescribed: 15
Plan Total Prescribed Dose: 30 Gy
Reference Point Dosage Given to Date: 49 Gy
Reference Point Session Dosage Given: 2 Gy
Session Number: 27

## 2022-08-02 ENCOUNTER — Ambulatory Visit: Payer: Medicare Other

## 2022-08-03 ENCOUNTER — Other Ambulatory Visit: Payer: Self-pay

## 2022-08-03 ENCOUNTER — Ambulatory Visit
Admission: RE | Admit: 2022-08-03 | Discharge: 2022-08-03 | Disposition: A | Discharge status: Home / Self Care | Payer: Medicare Other | Source: Ambulatory Visit | Attending: Radiation Oncology | Admitting: Radiation Oncology

## 2022-08-03 DIAGNOSIS — C61 Malignant neoplasm of prostate: Secondary | ICD-10-CM | POA: Diagnosis not present

## 2022-08-03 LAB — RAD ONC ARIA SESSION SUMMARY
Course Elapsed Days: 39
Plan Fractions Treated to Date: 3
Plan Prescribed Dose Per Fraction: 2 Gy
Plan Total Fractions Prescribed: 15
Plan Total Prescribed Dose: 30 Gy
Reference Point Dosage Given to Date: 51 Gy
Reference Point Session Dosage Given: 2 Gy
Session Number: 28

## 2022-08-06 ENCOUNTER — Other Ambulatory Visit: Payer: Self-pay

## 2022-08-06 ENCOUNTER — Ambulatory Visit
Admission: RE | Admit: 2022-08-06 | Discharge: 2022-08-06 | Disposition: A | Discharge status: Home / Self Care | Payer: Medicare Other | Source: Ambulatory Visit | Attending: Radiation Oncology | Admitting: Radiation Oncology

## 2022-08-06 DIAGNOSIS — C61 Malignant neoplasm of prostate: Secondary | ICD-10-CM | POA: Diagnosis not present

## 2022-08-06 LAB — RAD ONC ARIA SESSION SUMMARY
Course Elapsed Days: 42
Plan Fractions Treated to Date: 4
Plan Prescribed Dose Per Fraction: 2 Gy
Plan Total Fractions Prescribed: 15
Plan Total Prescribed Dose: 30 Gy
Reference Point Dosage Given to Date: 53 Gy
Reference Point Session Dosage Given: 2 Gy
Session Number: 29

## 2022-08-07 ENCOUNTER — Other Ambulatory Visit: Payer: Self-pay

## 2022-08-07 ENCOUNTER — Ambulatory Visit
Admission: RE | Admit: 2022-08-07 | Discharge: 2022-08-07 | Disposition: A | Discharge status: Home / Self Care | Payer: Medicare Other | Source: Ambulatory Visit | Attending: Radiation Oncology | Admitting: Radiation Oncology

## 2022-08-07 DIAGNOSIS — C61 Malignant neoplasm of prostate: Secondary | ICD-10-CM | POA: Diagnosis not present

## 2022-08-07 LAB — RAD ONC ARIA SESSION SUMMARY
Course Elapsed Days: 43
Plan Fractions Treated to Date: 5
Plan Prescribed Dose Per Fraction: 2 Gy
Plan Total Fractions Prescribed: 15
Plan Total Prescribed Dose: 30 Gy
Reference Point Dosage Given to Date: 55 Gy
Reference Point Session Dosage Given: 2 Gy
Session Number: 30

## 2022-08-08 ENCOUNTER — Other Ambulatory Visit: Payer: Self-pay

## 2022-08-08 ENCOUNTER — Ambulatory Visit
Admission: RE | Admit: 2022-08-08 | Discharge: 2022-08-08 | Disposition: A | Discharge status: Home / Self Care | Payer: Medicare Other | Source: Ambulatory Visit | Attending: Radiation Oncology | Admitting: Radiation Oncology

## 2022-08-08 DIAGNOSIS — C61 Malignant neoplasm of prostate: Secondary | ICD-10-CM | POA: Diagnosis not present

## 2022-08-08 LAB — RAD ONC ARIA SESSION SUMMARY
Course Elapsed Days: 44
Plan Fractions Treated to Date: 6
Plan Prescribed Dose Per Fraction: 2 Gy
Plan Total Fractions Prescribed: 15
Plan Total Prescribed Dose: 30 Gy
Reference Point Dosage Given to Date: 57 Gy
Reference Point Session Dosage Given: 2 Gy
Session Number: 31

## 2022-08-09 ENCOUNTER — Other Ambulatory Visit: Payer: Self-pay

## 2022-08-09 ENCOUNTER — Ambulatory Visit
Admission: RE | Admit: 2022-08-09 | Discharge: 2022-08-09 | Disposition: A | Discharge status: Home / Self Care | Payer: Medicare Other | Source: Ambulatory Visit | Attending: Radiation Oncology | Admitting: Radiation Oncology

## 2022-08-09 DIAGNOSIS — C61 Malignant neoplasm of prostate: Secondary | ICD-10-CM | POA: Diagnosis not present

## 2022-08-09 LAB — RAD ONC ARIA SESSION SUMMARY
Course Elapsed Days: 45
Plan Fractions Treated to Date: 7
Plan Prescribed Dose Per Fraction: 2 Gy
Plan Total Fractions Prescribed: 15
Plan Total Prescribed Dose: 30 Gy
Reference Point Dosage Given to Date: 59 Gy
Reference Point Session Dosage Given: 2 Gy
Session Number: 32

## 2022-08-10 ENCOUNTER — Other Ambulatory Visit: Payer: Self-pay

## 2022-08-10 ENCOUNTER — Ambulatory Visit
Admission: RE | Admit: 2022-08-10 | Discharge: 2022-08-10 | Disposition: A | Discharge status: Home / Self Care | Payer: Medicare Other | Source: Ambulatory Visit | Attending: Radiation Oncology | Admitting: Radiation Oncology

## 2022-08-10 DIAGNOSIS — C61 Malignant neoplasm of prostate: Secondary | ICD-10-CM | POA: Diagnosis not present

## 2022-08-10 LAB — RAD ONC ARIA SESSION SUMMARY
Course Elapsed Days: 46
Plan Fractions Treated to Date: 8
Plan Prescribed Dose Per Fraction: 2 Gy
Plan Total Fractions Prescribed: 15
Plan Total Prescribed Dose: 30 Gy
Reference Point Dosage Given to Date: 61 Gy
Reference Point Session Dosage Given: 2 Gy
Session Number: 33

## 2022-08-13 ENCOUNTER — Ambulatory Visit
Admission: RE | Admit: 2022-08-13 | Discharge: 2022-08-13 | Disposition: A | Discharge status: Home / Self Care | Payer: Medicare Other | Source: Ambulatory Visit | Attending: Radiation Oncology | Admitting: Radiation Oncology

## 2022-08-13 ENCOUNTER — Other Ambulatory Visit: Payer: Self-pay

## 2022-08-13 DIAGNOSIS — C61 Malignant neoplasm of prostate: Secondary | ICD-10-CM | POA: Diagnosis not present

## 2022-08-13 LAB — RAD ONC ARIA SESSION SUMMARY
Course Elapsed Days: 49
Plan Fractions Treated to Date: 9
Plan Prescribed Dose Per Fraction: 2 Gy
Plan Total Fractions Prescribed: 15
Plan Total Prescribed Dose: 30 Gy
Reference Point Dosage Given to Date: 63 Gy
Reference Point Session Dosage Given: 2 Gy
Session Number: 34

## 2022-08-14 ENCOUNTER — Ambulatory Visit
Admission: RE | Admit: 2022-08-14 | Discharge: 2022-08-14 | Disposition: A | Discharge status: Home / Self Care | Payer: Medicare Other | Source: Ambulatory Visit | Attending: Radiation Oncology | Admitting: Radiation Oncology

## 2022-08-14 ENCOUNTER — Other Ambulatory Visit: Payer: Self-pay

## 2022-08-14 DIAGNOSIS — C61 Malignant neoplasm of prostate: Secondary | ICD-10-CM | POA: Diagnosis not present

## 2022-08-14 LAB — RAD ONC ARIA SESSION SUMMARY
Course Elapsed Days: 50
Plan Fractions Treated to Date: 10
Plan Prescribed Dose Per Fraction: 2 Gy
Plan Total Fractions Prescribed: 15
Plan Total Prescribed Dose: 30 Gy
Reference Point Dosage Given to Date: 65 Gy
Reference Point Session Dosage Given: 2 Gy
Session Number: 35

## 2022-08-15 ENCOUNTER — Other Ambulatory Visit: Payer: Self-pay

## 2022-08-15 ENCOUNTER — Ambulatory Visit
Admission: RE | Admit: 2022-08-15 | Discharge: 2022-08-15 | Disposition: A | Discharge status: Home / Self Care | Payer: Medicare Other | Source: Ambulatory Visit | Attending: Radiation Oncology | Admitting: Radiation Oncology

## 2022-08-15 DIAGNOSIS — C61 Malignant neoplasm of prostate: Secondary | ICD-10-CM | POA: Diagnosis not present

## 2022-08-15 LAB — RAD ONC ARIA SESSION SUMMARY
Course Elapsed Days: 51
Plan Fractions Treated to Date: 11
Plan Prescribed Dose Per Fraction: 2 Gy
Plan Total Fractions Prescribed: 15
Plan Total Prescribed Dose: 30 Gy
Reference Point Dosage Given to Date: 67 Gy
Reference Point Session Dosage Given: 2 Gy
Session Number: 36

## 2022-08-16 ENCOUNTER — Ambulatory Visit
Admission: RE | Admit: 2022-08-16 | Discharge: 2022-08-16 | Disposition: A | Discharge status: Home / Self Care | Payer: Medicare Other | Source: Ambulatory Visit | Attending: Radiation Oncology | Admitting: Radiation Oncology

## 2022-08-16 ENCOUNTER — Ambulatory Visit: Payer: Medicare Other

## 2022-08-16 ENCOUNTER — Other Ambulatory Visit: Payer: Self-pay

## 2022-08-16 DIAGNOSIS — C61 Malignant neoplasm of prostate: Secondary | ICD-10-CM | POA: Diagnosis not present

## 2022-08-16 LAB — RAD ONC ARIA SESSION SUMMARY
Course Elapsed Days: 52
Plan Fractions Treated to Date: 12
Plan Prescribed Dose Per Fraction: 2 Gy
Plan Total Fractions Prescribed: 15
Plan Total Prescribed Dose: 30 Gy
Reference Point Dosage Given to Date: 69 Gy
Reference Point Session Dosage Given: 2 Gy
Session Number: 37

## 2022-08-17 ENCOUNTER — Ambulatory Visit
Admission: RE | Admit: 2022-08-17 | Discharge: 2022-08-17 | Disposition: A | Discharge status: Home / Self Care | Payer: Medicare Other | Source: Ambulatory Visit | Attending: Radiation Oncology | Admitting: Radiation Oncology

## 2022-08-17 ENCOUNTER — Ambulatory Visit: Payer: Medicare Other

## 2022-08-17 ENCOUNTER — Other Ambulatory Visit: Payer: Self-pay

## 2022-08-17 DIAGNOSIS — C61 Malignant neoplasm of prostate: Secondary | ICD-10-CM | POA: Diagnosis present

## 2022-08-17 LAB — RAD ONC ARIA SESSION SUMMARY
Course Elapsed Days: 53
Plan Fractions Treated to Date: 13
Plan Prescribed Dose Per Fraction: 2 Gy
Plan Total Fractions Prescribed: 15
Plan Total Prescribed Dose: 30 Gy
Reference Point Dosage Given to Date: 71 Gy
Reference Point Session Dosage Given: 2 Gy
Session Number: 38

## 2022-08-21 ENCOUNTER — Ambulatory Visit
Admission: RE | Admit: 2022-08-21 | Discharge: 2022-08-21 | Disposition: A | Discharge status: Home / Self Care | Payer: Medicare Other | Source: Ambulatory Visit | Attending: Radiation Oncology | Admitting: Radiation Oncology

## 2022-08-21 ENCOUNTER — Other Ambulatory Visit: Payer: Self-pay

## 2022-08-21 ENCOUNTER — Ambulatory Visit: Payer: Medicare Other

## 2022-08-21 DIAGNOSIS — C61 Malignant neoplasm of prostate: Secondary | ICD-10-CM | POA: Diagnosis not present

## 2022-08-21 LAB — RAD ONC ARIA SESSION SUMMARY
Course Elapsed Days: 57
Plan Fractions Treated to Date: 14
Plan Prescribed Dose Per Fraction: 2 Gy
Plan Total Fractions Prescribed: 15
Plan Total Prescribed Dose: 30 Gy
Reference Point Dosage Given to Date: 73 Gy
Reference Point Session Dosage Given: 2 Gy
Session Number: 39

## 2022-08-22 ENCOUNTER — Other Ambulatory Visit: Payer: Self-pay

## 2022-08-22 ENCOUNTER — Encounter: Payer: Self-pay | Admitting: Urology

## 2022-08-22 ENCOUNTER — Ambulatory Visit
Admission: RE | Admit: 2022-08-22 | Discharge: 2022-08-22 | Disposition: A | Discharge status: Home / Self Care | Payer: Medicare Other | Source: Ambulatory Visit | Attending: Radiation Oncology | Admitting: Radiation Oncology

## 2022-08-22 DIAGNOSIS — C61 Malignant neoplasm of prostate: Secondary | ICD-10-CM

## 2022-08-22 LAB — RAD ONC ARIA SESSION SUMMARY
Course Elapsed Days: 58
Plan Fractions Treated to Date: 15
Plan Prescribed Dose Per Fraction: 2 Gy
Plan Total Fractions Prescribed: 15
Plan Total Prescribed Dose: 30 Gy
Reference Point Dosage Given to Date: 75 Gy
Reference Point Session Dosage Given: 2 Gy
Session Number: 40

## 2022-10-09 NOTE — Progress Notes (Signed)
  Radiation Oncology         [phone] ________________________________  Name: John Finley MRN: [id number]  Date of Service: 10/10/2022  DOB: 04/03/1951  Post Treatment Telephone Note  Diagnosis:  71 y.o. 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 (as documented in provider EOT note)  Pre Treatment IPSS Score: 16-moderate, (as documented in the provider consult note).  The patient was available for call today.   Symptoms of fatigue have improved since completing therapy.  Symptoms of bladder changes have improved since completing therapy. Current symptoms include Nocturia x2-3 and medications for bladder symptoms include Tamsulosin (Flomax).   Symptoms of bowel changes have improved since completing therapy. Current symptoms include none, and medications for bowel symptoms include none.   Post Treatment IPSS Score: IPSS Questionnaire (AUA-7): Over the past month.   1)  How often have you had a sensation of not emptying your bladder completely after you finish urinating?  3 - About half the time  2)  How often have you had to urinate again less than two hours after you finished urinating? 3 - About half the time  3)  How often have you found you stopped and started again several times when you urinated?  3 - About half the time  4) How difficult have you found it to postpone urination?  1 - Less than 1 time in 5  5) How often have you had a weak urinary stream?  1 - Less than 1 time in 5  6) How often have you had to push or strain to begin urination?  3 - About half the time  7) How many times did you most typically get up to urinate from the time you went to bed until the time you got up in the morning?  2 - 2 times  Total score:  16. Which indicates moderate symptoms  0-7 mildly symptomatic   8-19 moderately symptomatic   20-35 severely symptomatic   Patient was advised to call to schedule their post-treatment  follow up with his urologist Dr. Jenny Reichmann for ongoing surveillance. He was counseled that PSA levels will be drawn in his urologist office, and was reassured that additional time is expected to improve bowel and bladder symptoms. He was encouraged to call back with concerns or questions regarding radiation.  This concludes the nursing interview.  Leandra Kern, LPN

## 2022-10-10 ENCOUNTER — Ambulatory Visit
Admission: RE | Admit: 2022-10-10 | Discharge: 2022-10-10 | Disposition: A | Discharge status: Home / Self Care | Payer: Medicare Other | Source: Ambulatory Visit | Attending: Urology | Admitting: Urology

## 2022-10-10 ENCOUNTER — Other Ambulatory Visit: Payer: Self-pay | Admitting: Urology

## 2022-10-10 DIAGNOSIS — C61 Malignant neoplasm of prostate: Secondary | ICD-10-CM

## 2022-10-10 NOTE — Progress Notes (Signed)
  Radiation Oncology         [phone] ________________________________  Name: John Finley MRN: [id number]  Date: 08/22/2022  DOB: 07-Nov-1951  End of Treatment Note  Diagnosis:   71 y.o. 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      Indication for treatment:  Curative, Definitive Radiotherapy       Radiation treatment dates:   06/25/22 - 08/22/22  Site/dose:  1. The prostate, seminal vesicles, and pelvic lymph nodes were initially treated to 45 Gy in 25 fractions of 1.8 Gy  2. The prostate only was boosted to 75 Gy with 15 additional fractions of 2.0 Gy   Beams/energy:  1. The prostate, seminal vesicles, and pelvic lymph nodes were initially treated using VMAT intensity modulated radiotherapy delivering 6 megavolt photons. Image guidance was performed with CB-CT studies prior to each fraction. He was immobilized with a body fix lower extremity mold.  2. the prostate only was boosted using VMAT intensity modulated radiotherapy delivering 6 megavolt photons. Image guidance was performed with CB-CT studies prior to each fraction. He was immobilized with a body fix lower extremity mold.  Narrative: The patient tolerated radiation treatment relatively well with only minor urinary irritation and modest fatigue.  He did report increased nocturia 3-4 times per night which was managed with Uroxatrol.  He did not experience any bowel issues and denied dysuria, gross hematuria, straining to void or incontinence.  Plan: The patient has completed radiation treatment. He will return to radiation oncology clinic for routine followup in one month. I advised him to call or return sooner if he has any questions or concerns related to his recovery or treatment. ________________________________  Sheral Apley. Tammi Klippel, M.D.

## 2022-12-27 ENCOUNTER — Other Ambulatory Visit: Payer: Self-pay | Admitting: Internal Medicine

## 2022-12-27 DIAGNOSIS — M48061 Spinal stenosis, lumbar region without neurogenic claudication: Secondary | ICD-10-CM

## 2022-12-27 DIAGNOSIS — Z122 Encounter for screening for malignant neoplasm of respiratory organs: Secondary | ICD-10-CM

## 2023-01-04 ENCOUNTER — Other Ambulatory Visit: Payer: Self-pay | Admitting: Internal Medicine

## 2023-01-04 DIAGNOSIS — Z122 Encounter for screening for malignant neoplasm of respiratory organs: Secondary | ICD-10-CM

## 2023-01-08 ENCOUNTER — Encounter: Payer: Self-pay | Admitting: *Deleted

## 2023-01-09 ENCOUNTER — Ambulatory Visit
Admission: RE | Admit: 2023-01-09 | Discharge: 2023-01-09 | Disposition: A | Discharge status: Home / Self Care | Payer: Medicare Other | Source: Ambulatory Visit | Attending: Internal Medicine | Admitting: Internal Medicine

## 2023-01-09 DIAGNOSIS — Z122 Encounter for screening for malignant neoplasm of respiratory organs: Secondary | ICD-10-CM

## 2023-01-14 ENCOUNTER — Inpatient Hospital Stay: Payer: Medicare Other | Attending: Nurse Practitioner | Admitting: *Deleted

## 2023-01-14 DIAGNOSIS — C61 Malignant neoplasm of prostate: Secondary | ICD-10-CM

## 2023-01-15 ENCOUNTER — Encounter: Payer: Self-pay | Admitting: *Deleted

## 2023-01-15 NOTE — Progress Notes (Signed)
  2 Identifiers used for verification purposes only. No vital signs were taken as this was a telephone visit. Pt denies pain today. He did say he has chronic hip pain for some years. Pt does not complain of any fatigue. Pt says he is sleeping much better as he only gets up to bathroom once at night. Pt still has daytime frequency and urgency but not as much. Pt has an occasional hotflash from ADT (Orgovyx) No issues with bowels. Pt does not exercise on a regular basis because of the chronic hip pain. I suggested that he could do some stretching and chair exercise. We discussed the "Nutrition Rainbow" and healthier eating habits. Pt denies smoking and using alcohol. Last colonoscopy was 07/29/2019. He will be due 07/2024 based on [facility] report. Last saw PCP in Nov.2023. Last PSA was <0.015 on Jan.10th,2024. SCP reviewed and completed.

## 2023-01-15 NOTE — Addendum Note (Signed)
Addended by: Rea [employer] D on: 01/15/2023 11:14 AM   Modules accepted: Orders

## 2023-01-16 ENCOUNTER — Other Ambulatory Visit: Payer: Self-pay | Admitting: Radiation Oncology

## 2023-02-01 ENCOUNTER — Ambulatory Visit
Admission: RE | Admit: 2023-02-01 | Discharge: 2023-02-01 | Disposition: A | Discharge status: Home / Self Care | Payer: Medicare Other | Source: Ambulatory Visit | Attending: Internal Medicine | Admitting: Internal Medicine

## 2023-02-01 DIAGNOSIS — M48061 Spinal stenosis, lumbar region without neurogenic claudication: Secondary | ICD-10-CM

## 2023-03-20 ENCOUNTER — Other Ambulatory Visit: Payer: Self-pay | Admitting: *Deleted

## 2023-03-20 DIAGNOSIS — I739 Peripheral vascular disease, unspecified: Secondary | ICD-10-CM

## 2023-03-20 DIAGNOSIS — I6523 Occlusion and stenosis of bilateral carotid arteries: Secondary | ICD-10-CM

## 2023-03-28 ENCOUNTER — Ambulatory Visit (INDEPENDENT_AMBULATORY_CARE_PROVIDER_SITE_OTHER): Payer: Medicare Other | Admitting: Vascular Surgery

## 2023-03-28 ENCOUNTER — Ambulatory Visit (HOSPITAL_COMMUNITY)
Admission: RE | Admit: 2023-03-28 | Discharge: 2023-03-28 | Disposition: A | Discharge status: Home / Self Care | Payer: Medicare Other | Source: Ambulatory Visit | Attending: Vascular Surgery | Admitting: Vascular Surgery

## 2023-03-28 ENCOUNTER — Encounter: Payer: Self-pay | Admitting: Vascular Surgery

## 2023-03-28 ENCOUNTER — Ambulatory Visit (INDEPENDENT_AMBULATORY_CARE_PROVIDER_SITE_OTHER)
Admission: RE | Admit: 2023-03-28 | Discharge: 2023-03-28 | Disposition: A | Discharge status: Home / Self Care | Payer: Medicare Other | Source: Ambulatory Visit | Attending: Vascular Surgery | Admitting: Vascular Surgery

## 2023-03-28 VITALS — BP 115/73 | HR 87 | Temp 98.2°F | Resp 20 | Ht 66.0 in | Wt 220.0 lb

## 2023-03-28 DIAGNOSIS — I739 Peripheral vascular disease, unspecified: Secondary | ICD-10-CM | POA: Insufficient documentation

## 2023-03-28 DIAGNOSIS — I6523 Occlusion and stenosis of bilateral carotid arteries: Secondary | ICD-10-CM | POA: Insufficient documentation

## 2023-03-28 DIAGNOSIS — I70219 Atherosclerosis of native arteries of extremities with intermittent claudication, unspecified extremity: Secondary | ICD-10-CM

## 2023-03-28 LAB — VAS US ABI WITH/WO TBI
Left ABI: 1.11
Right ABI: 0.9

## 2023-03-28 NOTE — Progress Notes (Signed)
REASON FOR VISIT:   Follow-up of carotid disease and peripheral arterial disease  MEDICAL ISSUES:   HISTORY OF BILATERAL CAROTID STENOSES: Previous studies had suggested a 50% distal common carotid artery stenosis on the right and a 40 to 59% left ICA stenosis.  His carotid duplex scan today shows improvement bilaterally and he has no significant stenosis on either side.  He remains asymptomatic.  He is on Plavix and is on a statin.  He had no further neurologic symptoms.  Will continue with yearly follow-up carotid duplex scan.  ASYMPTOMATIC PERIPHERAL ARTERIAL DISEASE: This patient does have evidence of mild peripheral arterial disease.  He has slightly diminished femoral pulses.  His ABIs normal on the left and 90% on the right.  He is asymptomatic.  Regardless, with his diabetes and hypertension I ordered follow-up ABIs when he comes back for his 1 year follow-up visit.  I encouraged him to stay as active as possible.  Fortunately he is not a smoker.  I have explained that I will be retiring and I have arranged for him to see Dr. Randie Heinz in 1 year.  He knows to call sooner if he has problems.  HPI:   John Finley is a pleasant 72 y.o. male who I last saw on 02/22/2022 with bilateral carotid disease.  He had a's stroke.  He had presented to the emergency department with facial droop and unsteady gait.  He was not on antiplatelet medication at the time.  He was started on Plavix for stroke prevention.  He had no further symptoms since that time.  At the time of his duplex a year ago he had a moderate 50% distal common carotid artery stenosis on the right and a 40 to 59% left internal carotid artery stenosis.  I set him up for a 1 year follow-up visit.  Since I saw him last, he denies any history of stroke, TIAs, expressive or receptive aphasia, or amaurosis fugax.  I do not get any history of claudication, rest pain, or nonhealing ulcers.  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  When he is hunched over a shopping cart the pain is better and Dr. Kathaleen Grinder felt that was consistent with his degenerative disc disease in his back.  His risk factors for peripheral arterial disease include type 2 diabetes, hypertension, hypercholesterolemia, and a remote history of tobacco use.  He quit smoking in 2006.  Past Medical History:  Diagnosis Date   Arrhythmia    pac's in 2018 per pt   BPH (benign prostatic hyperplasia)    takes cialis for   Cervical spondylosis without myelopathy    Chronic low back pain    Colon polyp    Coronary artery disease    Gastric ulcer    yrs ago   Herniated cervical disc    x2 numbness in left pinkie finger @ times, cannot sleep on right side,both arms get numb   Herpes zoster    hx of shingles more than 20 yrs ago both sides of back   High cholesterol    history of Pre-diabetes    Hypertension    Kidney stone    LVH (left ventricular hypertrophy)    Minimal cognitive impairment    Prostate cancer    S/P tooth extraction 05/28/2022   seeing dentist 06-06-2022 for tooth pain ? dry socket   Seasonal allergic rhinitis    Sleep apnea  uses cpap 18 mm mercury   Spinal stenosis of lumbar region    Stroke (cerebrum) 2018   Tick bite    area right epigastric  red with small  ulceration no drainage per pt on 06-05-2022   Type 2 diabetes mellitus    Vitamin D deficiency    Wears dentures upper     Family History  Problem Relation Age of Onset   Cancer - Other Mother    Diabetes Mother    Stroke Mother    Heart disease Mother    Alcohol abuse Mother    Hypertension Mother    Other Father        Accident   Hypertension Sister    Pulmonary embolism Sister    Diabetes Brother    Hypertension Brother    Stroke Brother    Depression Brother    Diabetes Brother     SOCIAL HISTORY: Social History   Tobacco Use   Smoking status: Former    Packs/day: 2.00    Years: 30.00     Additional pack years: 0.00    Total pack years: 60.00    Types: Cigarettes    Quit date: 2006    Years since quitting: 18.2   Smokeless tobacco: Never  Substance Use Topics   Alcohol use: No    Comment: Rare    Allergies  Allergen Reactions   Penicillins Itching and Rash   Sulfa Antibiotics Hives, Itching and Rash    Current Outpatient Medications  Medication Sig Dispense Refill   acyclovir (ZOVIRAX) 400 MG tablet Take 400 mg by mouth as needed.     alfuzosin (UROXATRAL) 10 MG 24 hr tablet TAKE 1 TABLET (10 MG TOTAL) BY MOUTH DAILY WITH BREAKFAST. (Patient taking differently: Take 5 mg by mouth daily with breakfast.) 90 tablet 1   amLODipine (NORVASC) 10 MG tablet Take 10 mg by mouth daily.      chlorhexidine (PERIDEX) 0.12 % solution Use as directed in the mouth or throat as needed.     clopidogrel (PLAVIX) 75 MG tablet Take 1 tablet (75 mg total) by mouth daily. 90 tablet 3   Empagliflozin-metFORMIN HCl ER (SYNJARDY XR) 09-999 MG TB24 2 (two) times daily.     irbesartan (AVAPRO) 300 MG tablet Take 300 mg by mouth daily.     ORGOVYX 120 MG TABS Take 1 tablet by mouth daily.     rosuvastatin (CRESTOR) 20 MG tablet Take 20 mg by mouth daily.      spironolactone (ALDACTONE) 50 MG tablet Take 50 mg by mouth daily.     No current facility-administered medications for this visit.    REVIEW OF SYSTEMS:   denotes positive finding,  denotes negative finding Cardiac  Comments:  Chest pain or chest pressure:    Shortness of breath upon exertion: x   Short of breath when lying flat:    Irregular heart rhythm:        Vascular    Pain in calf, thigh, or hip brought on by ambulation:    Pain in feet at night that wakes you up from your sleep:     Blood clot in your veins:    Leg swelling:         Pulmonary    Oxygen at home:    Productive cough:     Wheezing:         Neurologic    Sudden weakness in arms or legs:     Sudden numbness in arms  or legs:     Sudden onset  of difficulty speaking or slurred speech:    Temporary loss of vision in one eye:     Problems with dizziness:         Gastrointestinal    Blood in stool:     Vomited blood:         Genitourinary    Burning when urinating:     Blood in urine:        Psychiatric    Major depression:         Hematologic    Bleeding problems:    Problems with blood clotting too easily:        Skin    Rashes or ulcers:        Constitutional    Fever or chills:     PHYSICAL EXAM:   Vitals:   03/28/23 1240 03/28/23 1242  BP: 125/80 115/73  Pulse: 87   Resp: 20   Temp: 98.2 F (36.8 C)   SpO2: 96%   Weight: 220 lb (99.8 kg)   Height: 5\' 6"  (1.676 m)     GENERAL: The patient is a well-nourished male, in no acute distress. The vital signs are documented above. CARDIAC: There is a regular rate and rhythm.  VASCULAR: I do not detect carotid bruits. He has slightly diminished femoral pulses. I cannot palpate pedal pulses however both feet are warm and well-perfused. He has no significant lower extremity swelling. PULMONARY: There is good air exchange bilaterally without wheezing or rales. ABDOMEN: Soft and non-tender with normal pitched bowel sounds.  I do not palpate an aneurysm although it somewhat difficult to assess. MUSCULOSKELETAL: There are no major deformities or cyanosis. NEUROLOGIC: No focal weakness or paresthesias are detected. SKIN: There are no ulcers or rashes noted. PSYCHIATRIC: The patient has a normal affect.  DATA:    CAROTID DUPLEX: I have independently interpreted his carotid duplex scan today.  On the right side there is a less than 39% stenosis.  The right vertebral artery is patent with antegrade flow.  On the left side there is a less than 39% stenosis.  The left vertebral artery is patent with antegrade flow.  ARTERIAL DOPPLER STUDY: I have independently interpreted his arterial Doppler study today.  On the right side there is a biphasic posterior tibial  signal with a monophasic dorsalis pedis signal.  ABIs 90%.  Toe pressures 81 mmHg.  On the left side there is a triphasic posterior tibial signal with a biphasic dorsalis pedis signal.  ABIs 100%.  Toe pressure is 93 mmHg.  John Ferrarihristopher Chaylee Finley [facility] [place] Office [phone]

## 2023-11-27 ENCOUNTER — Ambulatory Visit
Admission: RE | Admit: 2023-11-27 | Discharge: 2023-11-27 | Disposition: A | Discharge status: Home / Self Care | Payer: Medicare Other | Source: Ambulatory Visit | Attending: Internal Medicine | Admitting: Internal Medicine

## 2023-11-27 ENCOUNTER — Other Ambulatory Visit: Payer: Self-pay | Admitting: Internal Medicine

## 2023-11-27 DIAGNOSIS — M1712 Unilateral primary osteoarthritis, left knee: Secondary | ICD-10-CM

## 2023-11-27 DIAGNOSIS — M79671 Pain in right foot: Secondary | ICD-10-CM

## 2023-12-02 ENCOUNTER — Other Ambulatory Visit: Payer: Self-pay | Admitting: Internal Medicine

## 2023-12-02 DIAGNOSIS — R5381 Other malaise: Secondary | ICD-10-CM

## 2023-12-02 DIAGNOSIS — Z13820 Encounter for screening for osteoporosis: Secondary | ICD-10-CM

## 2023-12-23 ENCOUNTER — Ambulatory Visit: Payer: Medicare Other | Attending: Internal Medicine | Admitting: Physical Therapy

## 2023-12-23 DIAGNOSIS — M25562 Pain in left knee: Secondary | ICD-10-CM | POA: Insufficient documentation

## 2023-12-23 DIAGNOSIS — M5459 Other low back pain: Secondary | ICD-10-CM | POA: Diagnosis present

## 2023-12-23 DIAGNOSIS — M6281 Muscle weakness (generalized): Secondary | ICD-10-CM | POA: Diagnosis present

## 2023-12-23 NOTE — Therapy (Signed)
 OUTPATIENT PHYSICAL THERAPY LOWER EXTREMITY EVALUATION   Patient Name: John Finley MRN: [id number] DOB:27-Sep-1951, 73 y.o., male Today's Date: 12/24/2023  END OF SESSION:  PT End of Session - 12/23/23 1318     Visit Number 1    Number of Visits 9   with eval   Date for PT Re-Evaluation 02/03/24    Authorization Type Medicare    PT Start Time 1316    PT Stop Time 1345   eval   PT Time Calculation (min) 29 min    Activity Tolerance Patient tolerated treatment well    Behavior During Therapy WFL for tasks assessed/performed             Past Medical History:  Diagnosis Date   Arrhythmia    pac's in 2018 per pt   BPH (benign prostatic hyperplasia)    takes cialis for   Cervical spondylosis without myelopathy    Chronic low back pain    Colon polyp    Coronary artery disease    Gastric ulcer    yrs ago   Herniated cervical disc    x2 numbness in left pinkie finger @ times, cannot sleep on right side,both arms get numb   Herpes zoster    hx of shingles more than 20 yrs ago both sides of back   High cholesterol    history of Pre-diabetes    Hypertension    Kidney stone    LVH (left ventricular hypertrophy)    Minimal cognitive impairment    Prostate cancer (HCC)    S/P tooth extraction 05/28/2022   seeing dentist 06-06-2022 for tooth pain ? dry socket   Seasonal allergic rhinitis    Sleep apnea    uses cpap 18 mm mercury   Spinal stenosis of lumbar region    Stroke (cerebrum) (HCC) 2018   Tick bite    area right epigastric  red with small  ulceration no drainage per pt on 06-05-2022   Type 2 diabetes mellitus (HCC)    Vitamin D deficiency    Wears dentures upper    Past Surgical History:  Procedure Laterality Date   ANKLE SURGERY Left    yrs go   GOLD SEED IMPLANT N/A 06/08/2022   Procedure: GOLD SEED IMPLANT;  Surgeon: Alvaro Hummer, MD;  Location: [facility];  Service: Urology;  Laterality: N/A;   HERNIA REPAIR Right    right inguinal  hernia yrs ago   SPACE OAR INSTILLATION N/A 06/08/2022   Procedure: SPACE OAR INSTILLATION;  Surgeon: Alvaro Hummer, MD;  Location: [facility];  Service: Urology;  Laterality: N/A;   TEE WITHOUT CARDIOVERSION N/A 02/19/2017   Procedure: TRANSESOPHAGEAL ECHOCARDIOGRAM (TEE);  Surgeon: Leim VEAR Moose, MD;  Location: [facility] ENDOSCOPY;  Service: Cardiovascular;  Laterality: N/A;   Patient Active Problem List   Diagnosis Date Noted   Malignant neoplasm of prostate (HCC) 04/18/2022   Hardening of the aorta (main artery of the heart) (HCC) 10/22/2021   Ex-smoker 10/22/2021   Chronic neck pain 10/22/2021   Controlled type 2 diabetes mellitus without complication (HCC) 10/22/2021   Peripheral sensory neuropathy due to type 2 diabetes mellitus (HCC) 10/22/2021   Heart murmur 05/30/2021   Pulmonary emphysema (HCC) 11/22/2020   Solitary pulmonary nodule 11/22/2020   Spinal stenosis of lumbar region 11/14/2019   Pain of both hip joints 11/04/2019   Seasonal allergic rhinitis 03/25/2019   Vitamin D deficiency 12/08/2018   Type 2 diabetes mellitus without complication (HCC) 12/08/2018  Mixed hyperlipidemia 12/08/2018   Minimal cognitive impairment 12/08/2018   Lumbar spondylosis 12/08/2018   History of cerebrovascular accident 12/08/2018   Cervical spondylosis without myelopathy 12/08/2018   Enlarged prostate 12/08/2018   Prediabetes 11/26/2018   Polyp of colon 11/26/2018   Kidney stone 11/26/2018   Hyperplasia of prostate 11/26/2018   Herpes zoster 11/26/2018   Gastric ulcer 11/26/2018   Fracture of calcaneus 11/26/2018   Coronary arteriosclerosis 11/26/2018   Elevated troponin    Stroke (cerebrum) (HCC) 02/17/2017   Cerebrovascular accident (CVA) (HCC)    OSA on CPAP    Suspected cerebrovascular accident (CVA) 02/16/2017   Hypertension 02/16/2017   Hypertensive urgency 02/16/2017    PCP: Roanna Ezekiel NOVAK, MD  REFERRING PROVIDER: Roanna Ezekiel NOVAK, MD  REFERRING  DIAG: M17.12 (ICD-10-CM) - Unilateral primary osteoarthritis, left knee; M54.5 low back pain, unspecified; G89.29 other chronic pain  THERAPY DIAG:  Muscle weakness (generalized)  Left knee pain, unspecified chronicity  Other low back pain  Rationale for Evaluation and Treatment: Rehabilitation  ONSET DATE: 12/03/2023 (referral date)  SUBJECTIVE:   SUBJECTIVE STATEMENT: Pt reports that he injured his L knee back in 2016 playing football, he reports that his knee was very swollen and he had to have fluid removed. Pt then reports he was in a cast for a while but he is not sure what exactly was injured, pt reports there was no surgical repair done on his L knee. Pt reports that he has not had any issues with pain in his knee since that injury until about 2-3 months ago when he started having severe pain in the knee. Pt reports that his knee hurts continuously, xray shows he has OA. Pt has been using voltarin and has found that helpful, has not tried heat, ice, or OTC medications. Pt does have a moist heat wrap at home he can try.  Pt also reports he has a history of a L ankle fracture from jumping off of a boat but this does not typically bother him, he feels a twinge when it is cold outside.  Pt's physician has offered him a steroid injection but he is afraid of needles and wants to avoid that option if possible, also understands that is a short-term solution.  Pt reports having the most difficulty with stairs, has to descend with his LLE first and heavily relies on a handrail. He also is unable to get down onto his knees and can't do extreme knee flexion (ex reach down to pull up his sock).  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  PERTINENT HISTORY: PMH: Atherosclerosis of coronary artery without angina pectoris, PVD, prostate CA, hypercalcemia, anemia, L knee OA, BPH, DMII, CVA, gastric ulcer, lumbar spondylosis, cervical spondlyosis  without myelopathy, OSA, HTN, HLD, lumbar spinal stenosis, emphysema, heart murmur, peripheral neuropathy  PAIN:  Are you having pain? Yes: NPRS scale: 3/10 with meds, 11/10 without meds Pain location: L knee along medial joint line Pain description: dull ache Aggravating factors: N/A Relieving factors: voltarin, hasn't tried anything else  PRECAUTIONS: None  RED FLAGS: None   WEIGHT BEARING RESTRICTIONS: No  FALLS:  Has patient fallen in last 6 months? 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  DIAGNOSTIC FINDINGS:  No recent imaging of lumbar spine  L knee xray 11/27/23 FINDINGS: Tiny joint effusion. Mild lateral patellar facet peripheral degenerative spurring. Mild medial and lateral trochlear degenerative spurring. No acute fracture or dislocation. Moderate to high-grade atherosclerotic calcifications.   IMPRESSION: 1. Mild patellofemoral osteoarthritis. 2. Tiny joint effusion.  PATIENT SURVEYS:  KOOS : 60%  COGNITION: Overall cognitive status:  mild memory impairments due to history of CVA      SENSATION: No N/T in legs, has some N/T in his big toe from spinal stenosis  EDEMA:  Mild edema in L knee as compared to R knee  POSTURE: rounded shoulders and forward head  PALPATION: No TTP, L patellar mobility WFL  LOWER EXTREMITY ROM:  Active ROM Right eval Left eval  Hip flexion    Hip extension    Hip  abduction    Hip adduction    Hip internal rotation    Hip external rotation    Knee flexion [facility] WFL  Knee extension [facility]  Ankle dorsiflexion    Ankle plantarflexion    Ankle inversion    Ankle eversion     (Blank rows = not tested)  LOWER EXTREMITY MMT:  MMT Right eval Left eval  Hip flexion 5 5  Hip extension    Hip abduction    Hip adduction    Hip internal rotation    Hip external rotation    Knee flexion 5 5  Knee extension 5 5  Ankle dorsiflexion 5 5  Ankle plantarflexion    Ankle inversion    Ankle eversion     (Blank rows = not tested)  LOWER EXTREMITY SPECIAL TESTS:  Knee special tests: Anterior drawer test: negative, Posterior drawer test: negative, Apley's compression test: negative, McMurray's test: negative, and Patellafemoral apprehension test: negative   GAIT: Distance walked: various clinic distances Assistive device utilized: None Level of assistance: Complete Independence Comments: mild antalgia                                                                                                                               TREATMENT DATE: PT Eval    PATIENT EDUCATION:  Education details: Eval findings, PT POC Person educated: Patient Education method: Explanation Education comprehension: verbalized understanding and needs further education  HOME EXERCISE PROGRAM: To be initiated  ASSESSMENT:  CLINICAL IMPRESSION: Patient is a 73 year old male referred to Neuro OPPT for L knee pain due to OA.   Pt's PMH is significant for: atherosclerosis of coronary artery without angina pectoris, PVD, prostate CA, hypercalcemia, anemia, L knee OA, BPH, DMII, CVA, gastric ulcer, lumbar spondylosis, cervical spondlyosis without  myelopathy, OSA, HTN, HLD, lumbar spinal stenosis, emphysema, heart murmur, peripheral neuropathy. The following deficits were present during the exam: increased L knee pain and impaired function. Pt would benefit from skilled PT to  address these impairments and functional limitations to maximize functional mobility independence and increase his independence with management of pain symptoms.   OBJECTIVE IMPAIRMENTS: decreased activity tolerance, decreased mobility, difficulty walking, increased edema, impaired perceived functional ability, impaired sensation, postural dysfunction, and pain.   ACTIVITY LIMITATIONS: squatting and stairs  PARTICIPATION LIMITATIONS: community activity  PERSONAL FACTORS: Age, Fitness, Time since onset of injury/illness/exacerbation, and 3+ comorbidities:    Atherosclerosis of coronary artery without angina pectoris, PVD, prostate CA, hypercalcemia, anemia, L knee OA, BPH, DMII, CVA, gastric ulcer, lumbar spondylosis, cervical spondlyosis without myelopathy, OSA, HTN, HLD, lumbar spinal stenosis, emphysema, heart murmur, peripheral neuropathyare also affecting patient's functional outcome.   REHAB POTENTIAL: Good  CLINICAL DECISION MAKING: Stable/uncomplicated  EVALUATION COMPLEXITY: Low   GOALS: Goals reviewed with patient? Yes  SHORT TERM GOALS= LONG TERM GOALS due to length of POC  LONG TERM GOALS: Target date: 01/24/2024  Pt will be independent with final land and aquatic HEPs for improved strength and management of pain symptoms. Baseline:  Goal status: INITIAL  2.  Pt will improve his score on the KOOS to >/= 75% to demonstrate decreased disability level Baseline: 60% of normal function (1/6) Goal status: INITIAL  3.  Oswestry to be assessed and LTG set if appropriate Baseline:  Goal status: INITIAL  4.  Lumbar ROM to be assessed and LTG set if appropriate Baseline:  Goal status: INITIAL  5.  Pt will report pain in L knee </= 7/10 at the most Baseline: 11/10 without voltarin (1/6) Goal status: INITIAL    PLAN:  PT FREQUENCY: 2x/week (alternate land and pool once aquatic schedule available)  PT DURATION: 4 weeks  PLANNED INTERVENTIONS: 97164- PT Re-evaluation,  97110-Therapeutic exercises, 97530- Therapeutic activity, 97112- Neuromuscular re-education, 97535- Self Care, 02859- Manual therapy, [id number]- Gait training, [phone]- Aquatic Therapy, [phone]- Electrical stimulation (manual), Patient/Family education, Balance training, Stair training, Taping, Dry Needling, Joint mobilization, Spinal mobilization, DME instructions, Cryotherapy, and Moist heat  PLAN FOR NEXT SESSION: eval low back pain (spinal stenosis) and include in POC/add LTGs; did he get scheduled for aquatic PT?; initiate HEP to address L knee pain from OA and chronic low back pain   Waddell Southgate, PT Waddell Southgate, PT, DPT, CSRS  12/24/2023, 8:38 AM

## 2023-12-30 ENCOUNTER — Ambulatory Visit: Payer: Medicare Other | Admitting: Physical Therapy

## 2023-12-31 ENCOUNTER — Emergency Department (HOSPITAL_COMMUNITY): Payer: Medicare Other

## 2023-12-31 ENCOUNTER — Emergency Department (HOSPITAL_BASED_OUTPATIENT_CLINIC_OR_DEPARTMENT_OTHER): Payer: Medicare Other

## 2023-12-31 ENCOUNTER — Emergency Department (HOSPITAL_BASED_OUTPATIENT_CLINIC_OR_DEPARTMENT_OTHER)
Admission: EM | Admit: 2023-12-31 | Discharge: 2023-12-31 | Disposition: A | Discharge status: Home / Self Care | Payer: Medicare Other | Attending: Emergency Medicine | Admitting: Emergency Medicine

## 2023-12-31 ENCOUNTER — Encounter (HOSPITAL_BASED_OUTPATIENT_CLINIC_OR_DEPARTMENT_OTHER): Payer: Self-pay

## 2023-12-31 DIAGNOSIS — I2510 Atherosclerotic heart disease of native coronary artery without angina pectoris: Secondary | ICD-10-CM | POA: Insufficient documentation

## 2023-12-31 DIAGNOSIS — Z1152 Encounter for screening for COVID-19: Secondary | ICD-10-CM | POA: Insufficient documentation

## 2023-12-31 DIAGNOSIS — R55 Syncope and collapse: Secondary | ICD-10-CM | POA: Diagnosis present

## 2023-12-31 DIAGNOSIS — Z79899 Other long term (current) drug therapy: Secondary | ICD-10-CM | POA: Diagnosis not present

## 2023-12-31 DIAGNOSIS — Z7902 Long term (current) use of antithrombotics/antiplatelets: Secondary | ICD-10-CM | POA: Diagnosis not present

## 2023-12-31 DIAGNOSIS — Z8673 Personal history of transient ischemic attack (TIA), and cerebral infarction without residual deficits: Secondary | ICD-10-CM | POA: Diagnosis not present

## 2023-12-31 DIAGNOSIS — R42 Dizziness and giddiness: Secondary | ICD-10-CM | POA: Diagnosis present

## 2023-12-31 LAB — RESP PANEL BY RT-PCR (RSV, FLU A&B, COVID)  RVPGX2
Influenza A by PCR: NEGATIVE
Influenza B by PCR: NEGATIVE
Resp Syncytial Virus by PCR: NEGATIVE
SARS Coronavirus 2 by RT PCR: NEGATIVE

## 2023-12-31 LAB — COMPREHENSIVE METABOLIC PANEL
ALT: 21 U/L (ref 0–44)
AST: 23 U/L (ref 15–41)
Albumin: 4.2 g/dL (ref 3.5–5.0)
Alkaline Phosphatase: 70 U/L (ref 38–126)
Anion gap: 10 (ref 5–15)
BUN: 12 mg/dL (ref 8–23)
CO2: 22 mmol/L (ref 22–32)
Calcium: 9.4 mg/dL (ref 8.9–10.3)
Chloride: 101 mmol/L (ref 98–111)
Creatinine, Ser: 0.98 mg/dL (ref 0.61–1.24)
GFR, Estimated: >60 mL/min (ref >60)
Glucose, Bld: 149 mg/dL — ABNORMAL HIGH (ref 70–99)
Potassium: 4.2 mmol/L (ref 3.5–5.1)
Sodium: 133 mmol/L — ABNORMAL LOW (ref 135–145)
Total Bilirubin: 0.5 mg/dL (ref 0.0–1.2)
Total Protein: 7.5 g/dL (ref 6.5–8.1)

## 2023-12-31 LAB — TROPONIN I (HIGH SENSITIVITY)
Troponin I (High Sensitivity): 10 ng/L (ref <18)
Troponin I (High Sensitivity): 15 ng/L (ref <18)

## 2023-12-31 LAB — DIFFERENTIAL
Abs Immature Granulocytes: 0.05 10*3/uL (ref 0.00–0.07)
Basophils Absolute: 0 10*3/uL (ref 0.0–0.1)
Basophils Relative: 0 %
Eosinophils Absolute: 0.2 10*3/uL (ref 0.0–0.5)
Eosinophils Relative: 2 %
Immature Granulocytes: 1 %
Lymphocytes Relative: 19 %
Lymphs Abs: 1.7 10*3/uL (ref 0.7–4.0)
Monocytes Absolute: 0.6 10*3/uL (ref 0.1–1.0)
Monocytes Relative: 7 %
Neutro Abs: 6.2 10*3/uL (ref 1.7–7.7)
Neutrophils Relative %: 71 %

## 2023-12-31 LAB — CBC
HCT: 37.9 % — ABNORMAL LOW (ref 39.0–52.0)
Hemoglobin: 12.7 g/dL — ABNORMAL LOW (ref 13.0–17.0)
MCH: 29.7 pg (ref 26.0–34.0)
MCHC: 33.5 g/dL (ref 30.0–36.0)
MCV: 88.6 fL (ref 80.0–100.0)
Platelets: 284 10*3/uL (ref 150–400)
RBC: 4.28 MIL/uL (ref 4.22–5.81)
RDW: 14.4 % (ref 11.5–15.5)
WBC: 8.7 10*3/uL (ref 4.0–10.5)
nRBC: 0 % (ref 0.0–0.2)

## 2023-12-31 LAB — CBG MONITORING, ED: Glucose-Capillary: 141 mg/dL — ABNORMAL HIGH (ref 70–99)

## 2023-12-31 LAB — ETHANOL: Alcohol, Ethyl (B): <10 mg/dL (ref <10)

## 2023-12-31 LAB — APTT: aPTT: 29 s (ref 24–36)

## 2023-12-31 LAB — PROTIME-INR
INR: 1 (ref 0.8–1.2)
Prothrombin Time: 13 s (ref 11.4–15.2)

## 2023-12-31 MED ORDER — MECLIZINE HCL 25 MG PO TABS: 25.0000 mg | ORAL_TABLET | Freq: Three times a day (TID) | ORAL | 0 refills | Status: AC | PRN

## 2023-12-31 MED ORDER — ONDANSETRON 4 MG PO TBDP: 4.0000 mg | ORAL_TABLET | Freq: Three times a day (TID) | ORAL | 0 refills | Status: AC | PRN

## 2023-12-31 MED ORDER — ONDANSETRON HCL 4 MG/2ML IJ SOLN
4.0000 mg | Freq: Once | INTRAMUSCULAR | Status: AC
  Administered 2023-12-31: 4 mg via INTRAVENOUS
  Filled 2023-12-31: qty 2

## 2023-12-31 MED ORDER — MECLIZINE HCL 25 MG PO TABS
25.0000 mg | ORAL_TABLET | Freq: Once | ORAL | Status: AC
  Administered 2023-12-31: 25 mg via ORAL
  Filled 2023-12-31: qty 1

## 2023-12-31 NOTE — ED Provider Notes (Signed)
 [facility] Provider Note   CSN: [id number] Arrival date & time: 12/31/23  1006     History  Chief Complaint  Patient presents with   Dizziness    BLAND RUDZINSKI is a 73 y.o. male.  Patient here after having some dizzy episode this morning ongoing.  Seems worse with movement little bit better at rest but he still having a hard time walking and feeling off balance.  He had nausea and sweating feeling with it.  He denies any chest pain shortness of breath weakness numbness tingling.  He still feels dizzy on my evaluation and still feels off balance when he walks.  Patient takes Plavix  for prior stroke.  History of high cholesterol CAD.  Denies any vision loss leg weakness arm weakness speech changes.  No ringing in his ear.  The history is provided by the patient.       Home Medications Prior to Admission medications   Medication Sig Start Date End Date Taking? Authorizing Provider  acyclovir (ZOVIRAX) 400 MG tablet Take 400 mg by mouth as needed. 01/01/20   [provider]  alfuzosin  (UROXATRAL ) 10 MG 24 hr tablet TAKE 1 TABLET (10 MG TOTAL) BY MOUTH DAILY WITH BREAKFAST. Patient taking differently: Take 5 mg by mouth daily with breakfast. 01/16/23   Patrcia Cough, MD  amLODipine (NORVASC) 10 MG tablet Take 10 mg by mouth daily.  01/11/20   [provider]  chlorhexidine (PERIDEX) 0.12 % solution Use as directed in the mouth or throat as needed. 05/16/22   [provider]  clopidogrel  (PLAVIX ) 75 MG tablet Take 1 tablet (75 mg total) by mouth daily. 02/19/17   Rai, Nydia POUR, MD  Empagliflozin-metFORMIN HCl ER (SYNJARDY XR) 09-999 MG TB24 2 (two) times daily.    [provider]  irbesartan (AVAPRO) 300 MG tablet Take 300 mg by mouth daily. 12/03/19   [provider]  metformin (FORTAMET) 1000 MG (OSM) 24 hr tablet Take 1,000 mg by mouth once.    [provider]  ORGOVYX 120 MG TABS  Take 1 tablet by mouth daily.    [provider]  rosuvastatin (CRESTOR) 20 MG tablet Take 20 mg by mouth daily.  01/11/20   [provider]  Semaglutide (RYBELSUS) 3 MG TABS Take 3 mg by mouth 2 (two) times daily.    [provider]  spironolactone (ALDACTONE) 50 MG tablet Take 50 mg by mouth daily.    [provider]      Allergies    Penicillins and Sulfa antibiotics    Review of Systems   Review of Systems  Physical Exam Updated Vital Signs  ED Triage Vitals [12/31/23 1025]  Encounter Vitals Group     BP (!) 146/79     Systolic BP Percentile      Diastolic BP Percentile      Pulse Rate 76     Resp 18     Temp 97.6 F (36.4 C)     Temp src      SpO2 99 %     Weight      Height      Head Circumference      Peak Flow      Pain Score      Pain Loc      Pain Education      Exclude from Growth Chart     Physical Exam Vitals and nursing note reviewed.  Constitutional:  General: He is not in acute distress.    Appearance: He is well-developed. He is not ill-appearing.  HENT:     Head: Normocephalic and atraumatic.     Nose: Nose normal.     Mouth/Throat:     Mouth: Mucous membranes are moist.  Eyes:     Extraocular Movements: Extraocular movements intact.     Conjunctiva/sclera: Conjunctivae normal.     Pupils: Pupils are equal, round, and reactive to light.  Cardiovascular:     Rate and Rhythm: Normal rate and regular rhythm.     Pulses: Normal pulses.     Heart sounds: Normal heart sounds. No murmur heard. Pulmonary:     Effort: Pulmonary effort is normal. No respiratory distress.     Breath sounds: Normal breath sounds.  Abdominal:     Palpations: Abdomen is soft.     Tenderness: There is no abdominal tenderness.  Musculoskeletal:        General: No swelling.     Cervical back: Normal range of motion and neck supple.  Skin:    General: Skin is warm and dry.     Capillary Refill: Capillary refill takes less than 2  seconds.  Neurological:     General: No focal deficit present.     Mental Status: He is alert and oriented to person, place, and time.     Cranial Nerves: No cranial nerve deficit.     Sensory: No sensory deficit.     Motor: No weakness.     Coordination: Coordination normal.     Comments: 5+ out of 5 strength, normal sensation, no drift, normal finger-nose-finger, normal speech, difficulty with ambulation no obvious nystagmus  Psychiatric:        Mood and Affect: Mood normal.     ED Results / Procedures / Treatments   Labs (all labs ordered are listed, but only abnormal results are displayed) Labs Reviewed  CBC - Abnormal; Notable for the following components:      Result Value   Hemoglobin 12.7 (*)    HCT 37.9 (*)    All other components within normal limits  COMPREHENSIVE METABOLIC PANEL - Abnormal; Notable for the following components:   Sodium 133 (*)    Glucose, Bld 149 (*)    All other components within normal limits  CBG MONITORING, ED - Abnormal; Notable for the following components:   Glucose-Capillary 141 (*)    All other components within normal limits  RESP PANEL BY RT-PCR (RSV, FLU A&B, COVID)  RVPGX2  PROTIME-INR  APTT  DIFFERENTIAL  ETHANOL  TROPONIN I (HIGH SENSITIVITY)  TROPONIN I (HIGH SENSITIVITY)    EKG EKG Interpretation Date/Time:  Tuesday December 31 2023 10:30:58 EST Ventricular Rate:  74 PR Interval:  265 QRS Duration:  86 QT Interval:  389 QTC Calculation: 432 R Axis:   -1  Text Interpretation: Sinus rhythm Prolonged PR interval Nonspecific T abnormalities, lateral leads Confirmed by Ruthe Cornet [phone]) on 12/31/2023 10:35:05 AM  Radiology CT HEAD WO CONTRAST Result Date: 12/31/2023 CLINICAL DATA:  The history: Syncope/presyncope, cerebrovascular cause suspected. Additional history provided: Dizziness, nausea, history of stroke. EXAM: CT HEAD WITHOUT CONTRAST TECHNIQUE: Contiguous axial images were obtained from the base of the skull  through the vertex without intravenous contrast. RADIATION DOSE REDUCTION: This exam was performed according to the departmental dose-optimization program which includes automated exposure control, adjustment of the mA and/or kV according to patient size and/or use of iterative reconstruction technique. COMPARISON:  MRI brain and MRA  head 02/16/2017. FINDINGS: Brain: Generalized cerebral atrophy. Known small chronic cortical/subcortical infarcts within the left frontal lobe (MCA vascular territory). Background moderate patchy and ill-defined hypoattenuation within the cerebral white matter, nonspecific but compatible with chronic small vascular disease. There is no acute intracranial hemorrhage. No acute demarcated cortical infarct. No extra-axial fluid collection. No evidence of an intracranial mass. No midline shift. Vascular: No hyperdense vessel. Atherosclerotic calcifications. Shallow broad-based osseous protrusion projecting outward from the right parietal calvarium, suspicious for an osteoma. Skull: No calvarial fracture or aggressive osseous lesion. Sinuses/Orbits: No mass or acute finding within the imaged orbits. No significant paranasal sinus disease at the imaged levels. IMPRESSION: 1. No evidence of an acute intracranial abnormality. 2. Known small chronic cortical/subcortical infarcts within the left frontal lobe (MCA vascular territory). 3. Background moderate cerebral white matter chronic small vessel ischemic disease. 4. Generalized cerebral atrophy. 5. Shallow broad-based osseous protrusion projecting outward from the right parietal calvarium, suspicious for an osteoma. Electronically Signed   By: Rockey Childs D.O.   On: 12/31/2023 11:36    Procedures Procedures    Medications Ordered in ED Medications  meclizine  (ANTIVERT ) tablet 25 mg (25 mg Oral Given 12/31/23 1204)  ondansetron  (ZOFRAN ) injection 4 mg (4 mg Intravenous Given 12/31/23 1204)    ED Course/ Medical Decision Making/ A&P                                  Medical Decision Making Amount and/or Complexity of Data Reviewed Labs: ordered. Radiology: ordered.  Risk Prescription drug management.   Steffan O Tagliaferro is here with dizziness.  Unremarkable vitals.  No fever.  EKG shows sinus rhythm.  No ischemic changes.  Differential diagnosis peripheral vertigo versus stroke versus electrolyte abnormality versus ACS versus infectious process.  Lab work has been obtained including CBC CMP lipase troponin CT head chest x-ray.  I reviewed interpreted EKG.  Will give meclizine  and Zofran .  Overall CT scan of the head is unremarkable per radiology report.  Troponin normal.  Lab work per my review and interpretation shows no significant anemia electrolyte abnormality kidney injury or leukocytosis.  Overall shared decision was made to send the most, for MRI to rule out stroke.  I do think this is likely a peripheral vertigo but think would be reasonable to rule out stroke given his history and past medical history and that he still symptomatic especially with walking but does seem to be improving.  Dr. Francesca accepts the patient in transfer.  This chart was dictated using voice recognition software.  Despite best efforts to proofread,  errors can occur which can change the documentation meaning.         Final Clinical Impression(s) / ED Diagnoses Final diagnoses:  Dizziness    Rx / DC Orders ED Discharge Orders     None         Ruthe Cornet, DO 12/31/23 1214

## 2023-12-31 NOTE — ED Triage Notes (Signed)
 Pt bib [facility] for eval of dizziness, onset this AM. Associated nausea, diaphoretic. Episode x2, called EMS. Advised that HR was "fine when it happened." Negative on stroke scale  Compliant w home meds, newly on semaglutide  164/80 CBG 167 HR 90

## 2023-12-31 NOTE — ED Provider Notes (Signed)
  Physical Exam  BP 134/75   Pulse 72   Temp 97.6 F (36.4 C)   Resp 17   SpO2 98%   Physical Exam Constitutional:      Appearance: He is well-developed.  Pulmonary:     Effort: Pulmonary effort is normal.  Musculoskeletal:        General: Normal range of motion.     Cervical back: Normal range of motion.  Skin:    General: Skin is warm and dry.  Neurological:     Mental Status: He is alert and oriented to person, place, and time.     Procedures  Procedures  ED Course / MDM    Medical Decision Making Amount and/or Complexity of Data Reviewed Labs: ordered. Radiology: ordered.  Risk Prescription drug management.   Patient arrives to [facility] ED for MRI r/o stroke after onset dizziness with nausea. He reports he feels better on arrival here, dizziness is resolved after Meclizine  and Zofran  provided prior to transfer. VSS.   MRI pending. History of stroke in the past. No immediate needs.   MRI performed and is negative for acute stroke. The patient continues to be asymptomatic. Will discharge home with Dx BPV, Rx Meclizine , Zofran        Odell Balls, PA-C 12/31/23 1834    Bari Roxie HERO, DO 12/31/23 1936

## 2023-12-31 NOTE — Discharge Instructions (Signed)
 Take the medications as prescribed. Follow up with your doctor if symptoms persist, and return to the ED if symptoms worsen.

## 2024-01-01 ENCOUNTER — Ambulatory Visit: Payer: Medicare Other | Admitting: Physical Therapy

## 2024-01-01 DIAGNOSIS — M6281 Muscle weakness (generalized): Secondary | ICD-10-CM

## 2024-01-01 DIAGNOSIS — M5459 Other low back pain: Secondary | ICD-10-CM

## 2024-01-01 DIAGNOSIS — M25562 Pain in left knee: Secondary | ICD-10-CM

## 2024-01-01 NOTE — Therapy (Signed)
 OUTPATIENT PHYSICAL THERAPY LOWER EXTREMITY AND THORACOLUMBAR TREATMENT   Patient Name: John Finley MRN: [id number] DOB:13-Dec-1951, 73 y.o., male Today's Date: 01/01/2024  END OF SESSION:  PT End of Session - 01/01/24 1447     Visit Number 2    Number of Visits 9   with eval   Date for PT Re-Evaluation 02/03/24    Authorization Type Medicare    PT Start Time 1445    PT Stop Time 1528    PT Time Calculation (min) 43 min    Activity Tolerance Patient tolerated treatment well    Behavior During Therapy WFL for tasks assessed/performed              Past Medical History:  Diagnosis Date   Arrhythmia    pac's in 2018 per pt   BPH (benign prostatic hyperplasia)    takes cialis for   Cervical spondylosis without myelopathy    Chronic low back pain    Colon polyp    Coronary artery disease    Gastric ulcer    yrs ago   Herniated cervical disc    x2 numbness in left pinkie finger @ times, cannot sleep on right side,both arms get numb   Herpes zoster    hx of shingles more than 20 yrs ago both sides of back   High cholesterol    history of Pre-diabetes    Hypertension    Kidney stone    LVH (left ventricular hypertrophy)    Minimal cognitive impairment    Prostate cancer (HCC)    S/P tooth extraction 05/28/2022   seeing dentist 06-06-2022 for tooth pain ? dry socket   Seasonal allergic rhinitis    Sleep apnea    uses cpap 18 mm mercury   Spinal stenosis of lumbar region    Stroke (cerebrum) (HCC) 2018   Tick bite    area right epigastric  red with small  ulceration no drainage per pt on 06-05-2022   Type 2 diabetes mellitus (HCC)    Vitamin D deficiency    Wears dentures upper    Past Surgical History:  Procedure Laterality Date   ANKLE SURGERY Left    yrs go   GOLD SEED IMPLANT N/A 06/08/2022   Procedure: GOLD SEED IMPLANT;  Surgeon: Osborn Blaze, MD;  Location: [facility] [place];  Service: Urology;  Laterality: N/A;   HERNIA REPAIR Right     right inguinal hernia yrs ago   SPACE OAR INSTILLATION N/A 06/08/2022   Procedure: SPACE OAR INSTILLATION;  Surgeon: Osborn Blaze, MD;  Location: [facility];  Service: Urology;  Laterality: N/A;   TEE WITHOUT CARDIOVERSION N/A 02/19/2017   Procedure: TRANSESOPHAGEAL ECHOCARDIOGRAM (TEE);  Surgeon: Liza Riggers, MD;  Location: [facility] ENDOSCOPY;  Service: Cardiovascular;  Laterality: N/A;   Patient Active Problem List   Diagnosis Date Noted   Malignant neoplasm of prostate (HCC) 04/18/2022   Hardening of the aorta (main artery of the heart) (HCC) 10/22/2021   Ex-smoker 10/22/2021   Chronic neck pain 10/22/2021   Controlled type 2 diabetes mellitus without complication (HCC) 10/22/2021   Peripheral sensory neuropathy due to type 2 diabetes mellitus (HCC) 10/22/2021   Heart murmur 05/30/2021   Pulmonary emphysema (HCC) 11/22/2020   Solitary pulmonary nodule 11/22/2020   Spinal stenosis of lumbar region 11/14/2019   Pain of both hip joints 11/04/2019   Seasonal allergic rhinitis 03/25/2019   Vitamin D deficiency 12/08/2018   Type 2 diabetes mellitus without complication (HCC)  12/08/2018   Mixed hyperlipidemia 12/08/2018   Minimal cognitive impairment 12/08/2018   Lumbar spondylosis 12/08/2018   History of cerebrovascular accident 12/08/2018   Cervical spondylosis without myelopathy 12/08/2018   Enlarged prostate 12/08/2018   Prediabetes 11/26/2018   Polyp of colon 11/26/2018   Kidney stone 11/26/2018   Hyperplasia of prostate 11/26/2018   Herpes zoster 11/26/2018   Gastric ulcer 11/26/2018   Fracture of calcaneus 11/26/2018   Coronary arteriosclerosis 11/26/2018   Elevated troponin    Stroke (cerebrum) (HCC) 02/17/2017   Cerebrovascular accident (CVA) (HCC)    OSA on CPAP    Suspected cerebrovascular accident (CVA) 02/16/2017   Hypertension 02/16/2017   Hypertensive urgency 02/16/2017    PCP: Nohemi Batters, MD  REFERRING PROVIDER: Nohemi Batters,  MD  REFERRING DIAG: M17.12 (ICD-10-CM) - Unilateral primary osteoarthritis, left knee; M54.5 low back pain, unspecified; G89.29 other chronic pain  THERAPY DIAG:  Muscle weakness (generalized)  Left knee pain, unspecified chronicity  Other low back pain  Rationale for Evaluation and Treatment: Rehabilitation  ONSET DATE: 12/03/2023 (referral date)  SUBJECTIVE:   SUBJECTIVE STATEMENT: Pt had onset of dizziness yesterday and went to ED, thinks it was vertigo, they gave him meclizine  and since then he has been feeling better. Pt reports having no history of vertigo.  Pt with ongoing knee pain (see below) and back pain (see below). Pt reports intermittent N/T down his BLE, does have tingling in his toes at times but unsure if this is more from peripheral neuropathy or related to neurogenic claudication. Pt also reports having numbness along his R lateral thigh region. Pt also reports that his arms go numb if he sleeps on his side.  PERTINENT HISTORY: PMH: Atherosclerosis of coronary artery without angina pectoris, PVD, prostate CA, hypercalcemia, anemia, L knee OA, BPH, DMII, CVA, gastric ulcer, lumbar spondylosis, cervical spondlyosis without myelopathy, OSA, HTN, HLD, lumbar spinal stenosis, emphysema, heart murmur, peripheral neuropathy  PAIN:  Are you having pain? Yes: NPRS scale: 4/10 with meds, 11/10 without meds Pain location: L knee along medial joint line Pain description: dull ache Aggravating factors: N/A Relieving factors: voltarin, hasn't tried anything else  Yes: NPRS scale: 4/10, constant Pain location: lower back Pain description: dull ache Aggravating factors: walking without leaning forwards on something (like a shopping cart) Relieving factors: leaning forwards on something in standing  PRECAUTIONS: None  RED FLAGS: None   WEIGHT BEARING RESTRICTIONS: No  FALLS:  Has patient fallen in last 6 months? 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  DIAGNOSTIC FINDINGS:  No recent imaging of lumbar spine  L knee xray 11/27/23 FINDINGS: Tiny joint effusion. Mild lateral patellar facet peripheral degenerative spurring. Mild medial and lateral trochlear degenerative spurring. No acute fracture or dislocation. Moderate to high-grade atherosclerotic calcifications.   IMPRESSION: 1. Mild patellofemoral osteoarthritis. 2. Tiny joint effusion.  PATIENT SURVEYS:  KOOS : 60%  COGNITION: Overall cognitive status:  mild memory impairments due to history  of CVA      SENSATION: No N/T in legs, has some N/T in his big toe from spinal stenosis  EDEMA:  Mild edema in L knee as compared to R knee  POSTURE: rounded shoulders and forward head  PALPATION: No TTP, L patellar mobility WFL  LOWER EXTREMITY ROM:  Active ROM Right eval Left eval  Hip flexion    Hip extension    Hip abduction    Hip adduction    Hip internal rotation    Hip external rotation    Knee flexion [facility] WFL  Knee extension [facility]  Ankle dorsiflexion    Ankle plantarflexion    Ankle inversion    Ankle eversion     (Blank rows = not tested)  LOWER EXTREMITY MMT:  MMT Right eval Left eval  Hip flexion 5 5  Hip extension    Hip abduction    Hip adduction    Hip internal rotation    Hip external rotation    Knee flexion 5 5  Knee extension 5 5  Ankle dorsiflexion 5 5  Ankle plantarflexion    Ankle  inversion    Ankle eversion     (Blank rows = not tested)  LOWER EXTREMITY SPECIAL TESTS:  Knee special tests: Anterior drawer test: negative, Posterior drawer test: negative, Apley's compression test: negative, McMurray's test: negative, and Patellafemoral apprehension test: negative   GAIT: Distance walked: various clinic distances Assistive device utilized: None Level of assistance: Complete Independence Comments: mild antalgia                                                                                                                               TREATMENT: TherAct: LUMBAR ROM:   Active  A/PROM  eval  Flexion WFL  Extension 75%, no pain  Right lateral flexion 25%, pain in lower back  Left lateral flexion 25%, pain in lower back  Right rotation WFL  Left rotation WFL   (Blank rows = not tested)    LUMBAR SPECIAL TESTS:  Slump test: Negative    Oswestry: 17/50 (34%), moderate disability   TherEx Trial of various supine and seated exercises to address chronic back pain and L knee pain: Supine LTR x 2 reps each direction, no stretch felt Supine SKTC x 1 rep B, no stretch felt Supine piriformis stretch x 1 rep B, difficulty maintaining this position due to hip tightness Attempt seated piriformis stretch but difficulty with L side due to knee pain, uncomfortable on R side (increases back pain) so deferred further repetitions Supine SLR x 10 reps with LLE Supine bridges x 10 reps with 5 sec hold Supine TA contract with march x 10 reps B Long-sitting butterfly stretch 3 x 30 sec Seated LLE HS curls with GTB 3 x 10-15 reps to fatigue  Leg press with BLE 100# 3 x 12 reps to fatigue  Added appropriate exercises to HEP, see bolded below   PATIENT  EDUCATION:  Education details: initiated HEP, PT POC (plan for no aquatic PT for now) Person educated: Patient Education method: Explanation, Demonstration, and Handouts Education comprehension: verbalized understanding,  returned demonstration, and needs further education  HOME EXERCISE PROGRAM: Access Code: [email or web address] URL: [email or web address] Date: 01/01/2024 Prepared by: Lorita Rosa  Exercises - Supine Bridge  - 1 x daily - 7 x weekly - 3 sets - 10 reps - 5 sec hold - Supine March  - 1 x daily - 7 x weekly - 3 sets - 10 reps - Butterfly Groin Stretch  - 1 x daily - 7 x weekly - 1 sets - 3-5 reps - 30-60 sec hold - Seated Hamstring Curl with Anchored Resistance  - 1 x daily - 7 x weekly - 3 sets - 10 reps  ASSESSMENT:  CLINICAL IMPRESSION: Emphasis of skilled PT session on further assessing/evaluating low back pain and symptoms and then trialing various exercises to address his chronic low back and L knee pain to determine what would be safe and appropriate for him to perform at home. Pt with some difficulty feeling much of a stretch with various low back stretches and difficulty performing other exercises due to hip tightness. Pt does exhibit increased disability level based on his score on the Oswestry indicating he has moderate disability (17/50) as well as decreased lumbar ROM as noted above. Pt continues to benefit from skilled PT services to increase in his independence with management of his pain symptoms. Continue POC.    OBJECTIVE IMPAIRMENTS: decreased activity tolerance, decreased mobility, difficulty walking, increased edema, impaired perceived functional ability, impaired sensation, postural dysfunction, and pain.   ACTIVITY LIMITATIONS: squatting and stairs  PARTICIPATION LIMITATIONS: community activity  PERSONAL FACTORS: Age, Fitness, Time since onset of injury/illness/exacerbation, and 3+ comorbidities:    Atherosclerosis of coronary artery without angina pectoris, PVD, prostate CA, hypercalcemia, anemia, L knee OA, BPH, DMII, CVA, gastric ulcer, lumbar spondylosis, cervical spondlyosis without myelopathy, OSA, HTN, HLD, lumbar spinal stenosis, emphysema, heart murmur,  peripheral neuropathyare also affecting patient's functional outcome.   REHAB POTENTIAL: Good  CLINICAL DECISION MAKING: Stable/uncomplicated  EVALUATION COMPLEXITY: Low   GOALS: Goals reviewed with patient? Yes  SHORT TERM GOALS= LONG TERM GOALS due to length of POC  LONG TERM GOALS: Target date: 01/24/2024  Pt will be independent with final land and aquatic HEPs for improved strength and management of pain symptoms. Baseline:  Goal status: INITIAL  2.  Pt will improve his score on the KOOS to >/= 75% to demonstrate decreased disability level Baseline: 60% of normal function (1/6) Goal status: INITIAL  3.  Pt will improve his score on the Oswestry to </= 14/50 to demonstrate decreased disability level Baseline: 17/50 - moderate disability (1/15) Goal status: INITIAL  4.  Pt will improve his lateral flexion lumbar ROM by 25% to demonstrate improved function. Baseline: 25% of normal L/R (1/15) Goal status: INITIAL  5.  Pt will report pain in L knee </= 7/10 at the most Baseline: 11/10 without voltarin (1/6) Goal status: INITIAL    PLAN:  PT FREQUENCY: 2x/week  PT DURATION: 4 weeks  PLANNED INTERVENTIONS: [id number]- PT Re-evaluation, 97110-Therapeutic exercises, 97530- Therapeutic activity, 97112- Neuromuscular re-education, 97535- Self Care, 21308- Manual therapy, [phone]- Gait training, [phone]- Aquatic Therapy, [phone]- Electrical stimulation (manual), Patient/Family education, Balance training, Stair training, Taping, Dry Needling, Joint mobilization, Spinal mobilization, DME instructions, Cryotherapy, and Moist heat  PLAN FOR NEXT SESSION: wants to stick with land therapy for now (no aquatic), add to  HEP to address L knee pain from OA and chronic low back pain from spinal stenosis, seated theraball stretch, resisted sit to stands, can he tolerate child's pose?   Evian Derringer, PT Lorita Rosa, PT, DPT, CSRS  01/01/2024, 3:31 PM

## 2024-01-06 ENCOUNTER — Ambulatory Visit: Payer: Medicare Other | Admitting: Physical Therapy

## 2024-01-06 DIAGNOSIS — M5459 Other low back pain: Secondary | ICD-10-CM

## 2024-01-06 DIAGNOSIS — M25562 Pain in left knee: Secondary | ICD-10-CM

## 2024-01-06 DIAGNOSIS — M6281 Muscle weakness (generalized): Secondary | ICD-10-CM

## 2024-01-06 NOTE — Therapy (Signed)
OUTPATIENT PHYSICAL THERAPY LOWER EXTREMITY AND THORACOLUMBAR TREATMENT   Patient Name: PAWAN DAME MRN: [id number] DOB:07-Apr-1951, 73 y.o., male Today's Date: 01/06/2024  END OF SESSION:  PT End of Session - 01/06/24 1404     Visit Number 3    Number of Visits 9   with eval   Date for PT Re-Evaluation 02/03/24    Authorization Type Medicare    PT Start Time 1402    PT Stop Time 1444    PT Time Calculation (min) 42 min    Activity Tolerance Patient tolerated treatment well    Behavior During Therapy WFL for tasks assessed/performed              Past Medical History:  Diagnosis Date   Arrhythmia    pac's in 2018 per pt   BPH (benign prostatic hyperplasia)    takes cialis for   Cervical spondylosis without myelopathy    Chronic low back pain    Colon polyp    Coronary artery disease    Gastric ulcer    yrs ago   Herniated cervical disc    x2 numbness in left pinkie finger @ times, cannot sleep on right side,both arms get numb   Herpes zoster    hx of shingles more than 20 yrs ago both sides of back   High cholesterol    history of Pre-diabetes    Hypertension    Kidney stone    LVH (left ventricular hypertrophy)    Minimal cognitive impairment    Prostate cancer (HCC)    S/P tooth extraction 05/28/2022   seeing dentist 06-06-2022 for tooth pain ? dry socket   Seasonal allergic rhinitis    Sleep apnea    uses cpap 18 mm mercury   Spinal stenosis of lumbar region    Stroke (cerebrum) (HCC) 2018   Tick bite    area right epigastric  red with small  ulceration no drainage per pt on 06-05-2022   Type 2 diabetes mellitus (HCC)    Vitamin D deficiency    Wears dentures upper    Past Surgical History:  Procedure Laterality Date   ANKLE SURGERY Left    yrs go   GOLD SEED IMPLANT N/A 06/08/2022   Procedure: GOLD SEED IMPLANT;  Surgeon: Sebastian Ache, MD;  Location: [facility];  Service: Urology;  Laterality: N/A;   HERNIA REPAIR Right     right inguinal hernia yrs ago   SPACE OAR INSTILLATION N/A 06/08/2022   Procedure: SPACE OAR INSTILLATION;  Surgeon: Sebastian Ache, MD;  Location: [facility];  Service: Urology;  Laterality: N/A;   TEE WITHOUT CARDIOVERSION N/A 02/19/2017   Procedure: TRANSESOPHAGEAL ECHOCARDIOGRAM (TEE);  Surgeon: Lars Masson, MD;  Location: [facility];  Service: Cardiovascular;  Laterality: N/A;   Patient Active Problem List   Diagnosis Date Noted   Malignant neoplasm of prostate (HCC) 04/18/2022   Hardening of the aorta (main artery of the heart) (HCC) 10/22/2021   Ex-smoker 10/22/2021   Chronic neck pain 10/22/2021   Controlled type 2 diabetes mellitus without complication (HCC) 10/22/2021   Peripheral sensory neuropathy due to type 2 diabetes mellitus (HCC) 10/22/2021   Heart murmur 05/30/2021   Pulmonary emphysema (HCC) 11/22/2020   Solitary pulmonary nodule 11/22/2020   Spinal stenosis of lumbar region 11/14/2019   Pain of both hip joints 11/04/2019   Seasonal allergic rhinitis 03/25/2019   Vitamin D deficiency 12/08/2018   Type 2 diabetes mellitus without complication (HCC)  12/08/2018   Mixed hyperlipidemia 12/08/2018   Minimal cognitive impairment 12/08/2018   Lumbar spondylosis 12/08/2018   History of cerebrovascular accident 12/08/2018   Cervical spondylosis without myelopathy 12/08/2018   Enlarged prostate 12/08/2018   Prediabetes 11/26/2018   Polyp of colon 11/26/2018   Kidney stone 11/26/2018   Hyperplasia of prostate 11/26/2018   Herpes zoster 11/26/2018   Gastric ulcer 11/26/2018   Fracture of calcaneus 11/26/2018   Coronary arteriosclerosis 11/26/2018   Elevated troponin    Stroke (cerebrum) (HCC) 02/17/2017   Cerebrovascular accident (CVA) (HCC)    OSA on CPAP    Suspected cerebrovascular accident (CVA) 02/16/2017   Hypertension 02/16/2017   Hypertensive urgency 02/16/2017    PCP: Harvest Forest, MD  REFERRING PROVIDER: Harvest Forest,  MD  REFERRING DIAG: M17.12 (ICD-10-CM) - Unilateral primary osteoarthritis, left knee; M54.5 low back pain, unspecified; G89.29 other chronic pain  THERAPY DIAG:  Muscle weakness (generalized)  Left knee pain, unspecified chronicity  Other low back pain  Rationale for Evaluation and Treatment: Rehabilitation  ONSET DATE: 12/03/2023 (referral date)  SUBJECTIVE:   SUBJECTIVE STATEMENT: Pt reports doing well. Has done his HEP 2x, has a hard time motivating himself but he is trying. Denies increase in pain today, is at baseline. No falls.    PERTINENT HISTORY: PMH: Atherosclerosis of coronary artery without angina pectoris, PVD, prostate CA, hypercalcemia, anemia, L knee OA, BPH, DMII, CVA, gastric ulcer, lumbar spondylosis, cervical spondlyosis without myelopathy, OSA, HTN, HLD, lumbar spinal stenosis, emphysema, heart murmur, peripheral neuropathy  PAIN:  Are you having pain? Yes: NPRS scale: 3/10  Pain location: L knee along medial joint line Pain description: dull ache Aggravating factors: N/A Relieving factors: voltarin, hasn't tried anything else  Yes: NPRS scale: 3/10, constant Pain location: lower back Pain description: dull ache Aggravating factors: walking without leaning forwards on something (like a shopping cart) Relieving factors: leaning forwards on something in standing  PRECAUTIONS: None  RED FLAGS: None   WEIGHT BEARING RESTRICTIONS: No  FALLS:  Has patient fallen in last 6 months? 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  DIAGNOSTIC FINDINGS:  No recent imaging of lumbar spine  L knee xray 11/27/23 FINDINGS: Tiny joint effusion. Mild lateral patellar facet peripheral degenerative spurring. Mild medial and lateral trochlear degenerative spurring. No acute fracture or dislocation. Moderate to high-grade atherosclerotic calcifications.   IMPRESSION: 1. Mild patellofemoral osteoarthritis. 2. Tiny joint effusion.  PATIENT SURVEYS:  KOOS : 60%  COGNITION: Overall cognitive status:  mild memory impairments due to history of CVA      SENSATION: No N/T in legs, has some N/T in his big toe from spinal stenosis  EDEMA:  Mild edema in L knee as compared to R knee  POSTURE: rounded shoulders and forward head  PALPATION: No TTP, L patellar mobility WFL  LOWER EXTREMITY ROM:  Active ROM Right eval Left eval  Hip flexion    Hip extension    Hip  abduction    Hip adduction    Hip internal rotation    Hip external rotation    Knee flexion [facility] WFL  Knee extension [facility]  Ankle dorsiflexion    Ankle plantarflexion    Ankle inversion    Ankle eversion     (Blank rows = not tested)  LOWER EXTREMITY MMT:  MMT Right eval Left eval  Hip flexion 5 5  Hip extension    Hip abduction    Hip adduction    Hip internal rotation    Hip external rotation    Knee flexion 5 5  Knee extension 5 5  Ankle dorsiflexion 5 5  Ankle plantarflexion    Ankle inversion    Ankle eversion     (Blank rows = not tested)  LOWER EXTREMITY SPECIAL TESTS:  Knee special tests: Anterior drawer test: negative, Posterior drawer test: negative, Apley's compression test: negative, McMurray's test: negative, and Patellafemoral apprehension test: negative  LUMBAR ROM:    Active  A/PROM  eval  Flexion WFL  Extension 75%, no pain  Right lateral flexion 25%, pain in lower back  Left lateral  flexion 25%, pain in lower back  Right rotation WFL  Left rotation WFL   (Blank rows = not tested)    GAIT: Distance walked: various clinic distances Assistive device utilized: None Level of assistance: Complete Independence Comments: mild antalgia                                                                                                                               TREATMENT:  TherEx SciFit multi-peaks level 5.5 for 8 minutes using BUE/BLEs for neural priming for reciprocal movement, dynamic cardiovascular warmup and increased amplitude of stepping. Min cues to maintain steps/min > 80, pt maintained 95 steps/min throughout. "I hate this bike". RPE of 9/10 following activity  The following exercises were performed for improved spinal mobility, BLE strength, core stability and posterior chain strength: Quadruped cat cows, x20 reps. Pt reported feeling good stretch with this.  Child's pose to cobra flow, x15 reps. No pain reported w/movement Supine iron crosses, x8 reps per side. "I fee;l this everywhere"  Seated march overs using 15# KB, x12 reps per side. RPE of 7-8/10 following exercise.  Standing pallof presses w/orange theraband, x15 reps per side. Min cues to slow down throughout  Standing pallof rotations w/orange theraband, x15 reps per side. Min cues for slow eccentric control and to avoid forward lean.  Bird dogs, x7 reps per side. Increased difficulty on LLE  RPE of 6-7/10 following session.     PATIENT EDUCATION:  Education details: continue HEP, how to anchor resistance band in door to perform seated hamstring curl.  Person educated: Patient Education method: Explanation, Demonstration, and Handouts Education comprehension: verbalized understanding, returned demonstration, and needs further education  HOME EXERCISE PROGRAM: Access Code: [email or web address] URL: [email or web address] Date: 01/01/2024 Prepared by: Peter Congo  Exercises - Supine Bridge   -  1 x daily - 7 x weekly - 3 sets - 10 reps - 5 sec hold - Supine March  - 1 x daily - 7 x weekly - 3 sets - 10 reps - Butterfly Groin Stretch  - 1 x daily - 7 x weekly - 1 sets - 3-5 reps - 30-60 sec hold - Seated Hamstring Curl with Anchored Resistance  - 1 x daily - 7 x weekly - 3 sets - 10 reps  ASSESSMENT:  CLINICAL IMPRESSION: Emphasis of skilled PT session on improved spinal mobility, functional core strength endurance. Pt very challenged by Scifit but denied pain w/activity. Pt tolerated session well and reported feeling good stretch w/cat cows, child's pose and iron crosses. Did not added to HEP this date to assess how pt feels after session. Continue POC.    OBJECTIVE IMPAIRMENTS: decreased activity tolerance, decreased mobility, difficulty walking, increased edema, impaired perceived functional ability, impaired sensation, postural dysfunction, and pain.   ACTIVITY LIMITATIONS: squatting and stairs  PARTICIPATION LIMITATIONS: community activity  PERSONAL FACTORS: Age, Fitness, Time since onset of injury/illness/exacerbation, and 3+ comorbidities:    Atherosclerosis of coronary artery without angina pectoris, PVD, prostate CA, hypercalcemia, anemia, L knee OA, BPH, DMII, CVA, gastric ulcer, lumbar spondylosis, cervical spondlyosis without myelopathy, OSA, HTN, HLD, lumbar spinal stenosis, emphysema, heart murmur, peripheral neuropathyare also affecting patient's functional outcome.   REHAB POTENTIAL: Good  CLINICAL DECISION MAKING: Stable/uncomplicated  EVALUATION COMPLEXITY: Low   GOALS: Goals reviewed with patient? Yes  SHORT TERM GOALS= LONG TERM GOALS due to length of POC  LONG TERM GOALS: Target date: 01/24/2024  Pt will be independent with final land and aquatic HEPs for improved strength and management of pain symptoms. Baseline:  Goal status: INITIAL  2.  Pt will improve his score on the KOOS to >/= 75% to demonstrate decreased disability level Baseline: 60% of  normal function (1/6) Goal status: INITIAL  3.  Pt will improve his score on the Oswestry to </= 14/50 to demonstrate decreased disability level Baseline: 17/50 - moderate disability (1/15) Goal status: INITIAL  4.  Pt will improve his lateral flexion lumbar ROM by 25% to demonstrate improved function. Baseline: 25% of normal L/R (1/15) Goal status: INITIAL  5.  Pt will report pain in L knee </= 7/10 at the most Baseline: 11/10 without voltarin (1/6) Goal status: INITIAL    PLAN:  PT FREQUENCY: 2x/week  PT DURATION: 4 weeks  PLANNED INTERVENTIONS: 16109- PT Re-evaluation, 97110-Therapeutic exercises, 97530- Therapeutic activity, 97112- Neuromuscular re-education, 97535- Self Care, [id number]- Manual therapy, [phone]- Gait training, [phone]- Aquatic Therapy, [phone]- Electrical stimulation (manual), Patient/Family education, Balance training, Stair training, Taping, Dry Needling, Joint mobilization, Spinal mobilization, DME instructions, Cryotherapy, and Moist heat  PLAN FOR NEXT SESSION: Add some exercises from last session to HEP if pt felt good (child's pose to cobra, cat/cow, iron crosses). wants to stick with land therapy for now (no aquatic), add to HEP to address L knee pain from OA and chronic low back pain from spinal stenosis, seated theraball stretch, resisted sit to stands, core stability    Jill Alexanders Ramsha Lonigro, PT, DPT  01/06/2024, 2:44 PM

## 2024-01-07 NOTE — Progress Notes (Signed)
ok 

## 2024-01-08 ENCOUNTER — Ambulatory Visit: Payer: Medicare Other | Admitting: Physical Therapy

## 2024-01-08 ENCOUNTER — Encounter: Payer: Self-pay | Admitting: Physical Therapy

## 2024-01-08 VITALS — BP 135/68 | HR 80

## 2024-01-08 DIAGNOSIS — M5459 Other low back pain: Secondary | ICD-10-CM

## 2024-01-08 DIAGNOSIS — M25562 Pain in left knee: Secondary | ICD-10-CM

## 2024-01-08 DIAGNOSIS — M6281 Muscle weakness (generalized): Secondary | ICD-10-CM | POA: Diagnosis not present

## 2024-01-08 NOTE — Therapy (Signed)
OUTPATIENT PHYSICAL THERAPY LOWER EXTREMITY AND THORACOLUMBAR TREATMENT   Patient Name: John Finley MRN: [id number] DOB:Nov 19, 1951, 73 y.o., male Today's Date: 01/08/2024  END OF SESSION:  PT End of Session - 01/08/24 1406     Visit Number 4    Number of Visits 9    Date for PT Re-Evaluation 02/03/24    Authorization Type Medicare    PT Start Time 1405    PT Stop Time 1445    PT Time Calculation (min) 40 min    Equipment Utilized During Treatment Gait belt    Activity Tolerance Patient tolerated treatment well    Behavior During Therapy WFL for tasks assessed/performed             Past Medical History:  Diagnosis Date   Arrhythmia    pac's in 2018 per pt   BPH (benign prostatic hyperplasia)    takes cialis for   Cervical spondylosis without myelopathy    Chronic low back pain    Colon polyp    Coronary artery disease    Gastric ulcer    yrs ago   Herniated cervical disc    x2 numbness in left pinkie finger @ times, cannot sleep on right side,both arms get numb   Herpes zoster    hx of shingles more than 20 yrs ago both sides of back   High cholesterol    history of Pre-diabetes    Hypertension    Kidney stone    LVH (left ventricular hypertrophy)    Minimal cognitive impairment    Prostate cancer (HCC)    S/P tooth extraction 05/28/2022   seeing dentist 06-06-2022 for tooth pain ? dry socket   Seasonal allergic rhinitis    Sleep apnea    uses cpap 18 mm mercury   Spinal stenosis of lumbar region    Stroke (cerebrum) (HCC) 2018   Tick bite    area right epigastric  red with small  ulceration no drainage per pt on 06-05-2022   Type 2 diabetes mellitus (HCC)    Vitamin D deficiency    Wears dentures upper    Past Surgical History:  Procedure Laterality Date   ANKLE SURGERY Left    yrs go   GOLD SEED IMPLANT N/A 06/08/2022   Procedure: GOLD SEED IMPLANT;  Surgeon: Sebastian Ache, MD;  Location: [facility] [place];  Service: Urology;   Laterality: N/A;   HERNIA REPAIR Right    right inguinal hernia yrs ago   SPACE OAR INSTILLATION N/A 06/08/2022   Procedure: SPACE OAR INSTILLATION;  Surgeon: Sebastian Ache, MD;  Location: [facility];  Service: Urology;  Laterality: N/A;   TEE WITHOUT CARDIOVERSION N/A 02/19/2017   Procedure: TRANSESOPHAGEAL ECHOCARDIOGRAM (TEE);  Surgeon: Lars Masson, MD;  Location: [facility] ENDOSCOPY;  Service: Cardiovascular;  Laterality: N/A;   Patient Active Problem List   Diagnosis Date Noted   Malignant neoplasm of prostate (HCC) 04/18/2022   Hardening of the aorta (main artery of the heart) (HCC) 10/22/2021   Ex-smoker 10/22/2021   Chronic neck pain 10/22/2021   Controlled type 2 diabetes mellitus without complication (HCC) 10/22/2021   Peripheral sensory neuropathy due to type 2 diabetes mellitus (HCC) 10/22/2021   Heart murmur 05/30/2021   Pulmonary emphysema (HCC) 11/22/2020   Solitary pulmonary nodule 11/22/2020   Spinal stenosis of lumbar region 11/14/2019   Pain of both hip joints 11/04/2019   Seasonal allergic rhinitis 03/25/2019   Vitamin D deficiency 12/08/2018   Type 2  diabetes mellitus without complication (HCC) 12/08/2018   Mixed hyperlipidemia 12/08/2018   Minimal cognitive impairment 12/08/2018   Lumbar spondylosis 12/08/2018   History of cerebrovascular accident 12/08/2018   Cervical spondylosis without myelopathy 12/08/2018   Enlarged prostate 12/08/2018   Prediabetes 11/26/2018   Polyp of colon 11/26/2018   Kidney stone 11/26/2018   Hyperplasia of prostate 11/26/2018   Herpes zoster 11/26/2018   Gastric ulcer 11/26/2018   Fracture of calcaneus 11/26/2018   Coronary arteriosclerosis 11/26/2018   Elevated troponin    Stroke (cerebrum) (HCC) 02/17/2017   Cerebrovascular accident (CVA) (HCC)    OSA on CPAP    Suspected cerebrovascular accident (CVA) 02/16/2017   Hypertension 02/16/2017   Hypertensive urgency 02/16/2017    PCP: Harvest Forest,  MD  REFERRING PROVIDER: Harvest Forest, MD  REFERRING DIAG: M17.12 (ICD-10-CM) - Unilateral primary osteoarthritis, left knee; M54.5 low back pain, unspecified; G89.29 other chronic pain  THERAPY DIAG:  Muscle weakness (generalized)  Left knee pain, unspecified chronicity  Other low back pain  Rationale for Evaluation and Treatment: Rehabilitation  ONSET DATE: 12/03/2023 (referral date)  SUBJECTIVE:   SUBJECTIVE STATEMENT: Pt reports that he is doing well. He wants to work on his back if possible. Denies falls and near falls and is wanting to work 2x on home program.    PERTINENT HISTORY: PMH: Atherosclerosis of coronary artery without angina pectoris, PVD, prostate CA, hypercalcemia, anemia, L knee OA, BPH, DMII, CVA, gastric ulcer, lumbar spondylosis, cervical spondlyosis without myelopathy, OSA, HTN, HLD, lumbar spinal stenosis, emphysema, heart murmur, peripheral neuropathy  PAIN:  Are you having pain? Yes: NPRS scale: 5/10  Pain location: L knee along medial joint line Pain description: dull ache Aggravating factors: N/A Relieving factors: voltarin, hasn't tried anything else  Yes: NPRS scale: 3/10, constant Pain location: lower back Pain description: dull ache Aggravating factors: walking without leaning forwards on something (like a shopping cart) Relieving factors: leaning forwards on something in standing  PRECAUTIONS: None  RED FLAGS: None   WEIGHT BEARING RESTRICTIONS: No  FALLS:  Has patient fallen in last 6 months? 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  DIAGNOSTIC FINDINGS:  No recent imaging of lumbar spine  L knee xray 11/27/23 FINDINGS: Tiny joint effusion. Mild lateral patellar facet peripheral degenerative spurring. Mild medial and lateral trochlear degenerative spurring. No acute fracture or dislocation. Moderate to high-grade atherosclerotic calcifications.   IMPRESSION: 1. Mild patellofemoral osteoarthritis. 2. Tiny joint effusion.  PATIENT SURVEYS:  KOOS : 60%  COGNITION: Overall cognitive status:  mild memory impairments due to history of CVA      SENSATION: No N/T in legs, has some N/T in his big toe from spinal stenosis  EDEMA:  Mild edema in L knee as compared to R knee  POSTURE: rounded shoulders and forward head  PALPATION: No TTP, L patellar mobility WFL  LOWER EXTREMITY ROM:  Active ROM Right eval Left eval  Hip flexion  Hip extension    Hip abduction    Hip adduction    Hip internal rotation    Hip external rotation    Knee flexion [facility] WFL  Knee extension [facility]  Ankle dorsiflexion    Ankle plantarflexion    Ankle inversion    Ankle eversion     (Blank rows = not tested)  LOWER EXTREMITY MMT:  MMT Right eval Left eval  Hip flexion 5 5  Hip extension    Hip abduction    Hip adduction    Hip internal rotation    Hip external rotation    Knee flexion 5 5  Knee extension 5 5  Ankle dorsiflexion 5 5  Ankle plantarflexion    Ankle inversion    Ankle eversion     (Blank rows = not tested)  LOWER EXTREMITY SPECIAL TESTS:  Knee special tests: Anterior drawer test: negative, Posterior drawer test: negative, Apley's compression test: negative, McMurray's test: negative, and Patellafemoral apprehension test: negative  LUMBAR ROM:    Active  A/PROM  eval  Flexion WFL  Extension 75%, no pain  Right lateral flexion 25%, pain  in lower back  Left lateral flexion 25%, pain in lower back  Right rotation WFL  Left rotation WFL   (Blank rows = not tested)    GAIT: Distance walked: various clinic distances Assistive device utilized: None Level of assistance: Complete Independence Comments: mild antalgia                                                                                                                               TREATMENT:  Vitals:   01/08/24 1409  BP: 135/68  Pulse: 80    Assessed seated on LUE, elevated but WFL for therapy   TherEx Following exercises for maximal lower trunk mobility, core control, and LE mobility: Mobility flow: cat-cow into child's pose with 10 second holds x 10 total Lower trunk cross stretch for lumbar mobility 2 x 5 bil with 5 holds Cobra press ups (feet of mat to decrease over activation of glutes) 2 x 10 reps  Scorpion 2 x 5 reps bilateral Seated longsit hamstring stretch 2 x 30 seconds   SciFit sprint setting level 6 for 8 minutes using BUE/BLEs for end of session finisher for reciprocal movement, dynamic cardiovascular challenge at end of session and increased amplitude of stepping. Min cues to maintain > 80 spm, patient with spm around 83-86 spm initially progressing to periods of up to 100 spm on "sprint resistance period"  RPE: 9/10 on fatigue by completition   PATIENT EDUCATION:  Education details: continue HEP, how to anchor resistance band in door to perform seated hamstring curl.  Person educated: Patient Education method: Explanation, Demonstration, and Handouts Education comprehension: verbalized understanding, returned demonstration, and needs further education  HOME EXERCISE PROGRAM: Access Code: [email or web address] URL: [email or web address] Date: 01/08/2024 Prepared by: Maryruth Eve  Exercises - Supine Bridge  - 1 x daily -  7 x weekly - 3 sets - 10 reps - 5 sec hold - Supine March  - 1 x daily - 7 x weekly - 3 sets - 10 reps - Butterfly  Groin Stretch  - 1 x daily - 7 x weekly - 1 sets - 3-5 reps - 30-60 sec hold - Seated Hamstring Curl with Anchored Resistance  - 1 x daily - 7 x weekly - 3 sets - 10 reps - Cat Cow to Child's Pose  - 1 x daily - 7 x weekly - 2 sets - 10 reps - Cobra  - 1 x daily - 7 x weekly - 3 sets - 10 reps - Supine Straight Leg Lumbar Rotation Stretch  - 1 x daily - 7 x weekly - 3 sets - 5 reps - Scorpion  - 1 x daily - 7 x weekly - 3 sets - 5 reps - Seated Table Hamstring Stretch  - 1 x daily - 7 x weekly - 3 sets - 30 seconds  hold  ASSESSMENT:  CLINICAL IMPRESSION: Emphasis of skilled PT session on continuing to progress spinal and LE mobility with review and progression of exercises introduced in earlier sessions. Added appropriate exercises to HEP and ended session with SciFit for cardiovascular and endurance, LE strengthening, and stepping pattern. Patient tolerated session well and continued to emphasize compliance with HEP to maximize gains as has minimal compliance at this time. Continue POC.  OBJECTIVE IMPAIRMENTS: decreased activity tolerance, decreased mobility, difficulty walking, increased edema, impaired perceived functional ability, impaired sensation, postural dysfunction, and pain.   ACTIVITY LIMITATIONS: squatting and stairs  PARTICIPATION LIMITATIONS: community activity  PERSONAL FACTORS: Age, Fitness, Time since onset of injury/illness/exacerbation, and 3+ comorbidities:    Atherosclerosis of coronary artery without angina pectoris, PVD, prostate CA, hypercalcemia, anemia, L knee OA, BPH, DMII, CVA, gastric ulcer, lumbar spondylosis, cervical spondlyosis without myelopathy, OSA, HTN, HLD, lumbar spinal stenosis, emphysema, heart murmur, peripheral neuropathyare also affecting patient's functional outcome.   REHAB POTENTIAL: Good  CLINICAL DECISION MAKING: Stable/uncomplicated  EVALUATION COMPLEXITY: Low   GOALS: Goals reviewed with patient? Yes  SHORT TERM GOALS= LONG TERM  GOALS due to length of POC  LONG TERM GOALS: Target date: 01/24/2024  Pt will be independent with final land and aquatic HEPs for improved strength and management of pain symptoms. Baseline:  Goal status: INITIAL  2.  Pt will improve his score on the KOOS to >/= 75% to demonstrate decreased disability level Baseline: 60% of normal function (1/6) Goal status: INITIAL  3.  Pt will improve his score on the Oswestry to </= 14/50 to demonstrate decreased disability level Baseline: 17/50 - moderate disability (1/15) Goal status: INITIAL  4.  Pt will improve his lateral flexion lumbar ROM by 25% to demonstrate improved function. Baseline: 25% of normal L/R (1/15) Goal status: INITIAL  5.  Pt will report pain in L knee </= 7/10 at the most Baseline: 11/10 without voltarin (1/6) Goal status: INITIAL    PLAN:  PT FREQUENCY: 2x/week  PT DURATION: 4 weeks  PLANNED INTERVENTIONS: [id number]- PT Re-evaluation, 97110-Therapeutic exercises, 97530- Therapeutic activity, 97112- Neuromuscular re-education, 97535- Self Care, [id number]- Manual therapy, [phone]- Gait training, [phone]- Aquatic Therapy, [phone]- Electrical stimulation (manual), Patient/Family education, Balance training, Stair training, Taping, Dry Needling, Joint mobilization, Spinal mobilization, DME instructions, Cryotherapy, and Moist heat  PLAN FOR NEXT SESSION: add to HEP to address L knee pain from OA and chronic low back pain from spinal stenosis, seated theraball stretch, resisted sit to  stands, core stability, trial dead bugs and lifting mechanics as tolerated    Carmelia Bake, PT, DPT  01/08/2024, 2:55 PM

## 2024-01-13 ENCOUNTER — Ambulatory Visit: Payer: Medicare Other | Admitting: Physical Therapy

## 2024-01-13 DIAGNOSIS — M6281 Muscle weakness (generalized): Secondary | ICD-10-CM

## 2024-01-13 DIAGNOSIS — M5459 Other low back pain: Secondary | ICD-10-CM

## 2024-01-13 DIAGNOSIS — M25562 Pain in left knee: Secondary | ICD-10-CM

## 2024-01-13 NOTE — Therapy (Signed)
OUTPATIENT PHYSICAL THERAPY LOWER EXTREMITY AND THORACOLUMBAR TREATMENT   Patient Name: John Finley MRN: [id number] DOB:12/04/1951, 73 y.o., male Today's Date: 01/13/2024  END OF SESSION:  PT End of Session - 01/13/24 1405     Visit Number 5    Number of Visits 9    Date for PT Re-Evaluation 02/03/24    Authorization Type Medicare    PT Start Time 1405   pt in restroom   PT Stop Time 1443    PT Time Calculation (min) 38 min    Equipment Utilized During Treatment Gait belt    Activity Tolerance Patient tolerated treatment well    Behavior During Therapy WFL for tasks assessed/performed              Past Medical History:  Diagnosis Date   Arrhythmia    pac's in 2018 per pt   BPH (benign prostatic hyperplasia)    takes cialis for   Cervical spondylosis without myelopathy    Chronic low back pain    Colon polyp    Coronary artery disease    Gastric ulcer    yrs ago   Herniated cervical disc    x2 numbness in left pinkie finger @ times, cannot sleep on right side,both arms get numb   Herpes zoster    hx of shingles more than 20 yrs ago both sides of back   High cholesterol    history of Pre-diabetes    Hypertension    Kidney stone    LVH (left ventricular hypertrophy)    Minimal cognitive impairment    Prostate cancer (HCC)    S/P tooth extraction 05/28/2022   seeing dentist 06-06-2022 for tooth pain ? dry socket   Seasonal allergic rhinitis    Sleep apnea    uses cpap 18 mm mercury   Spinal stenosis of lumbar region    Stroke (cerebrum) (HCC) 2018   Tick bite    area right epigastric  red with small  ulceration no drainage per pt on 06-05-2022   Type 2 diabetes mellitus (HCC)    Vitamin D deficiency    Wears dentures upper    Past Surgical History:  Procedure Laterality Date   ANKLE SURGERY Left    yrs go   GOLD SEED IMPLANT N/A 06/08/2022   Procedure: GOLD SEED IMPLANT;  Surgeon: Sebastian Ache, MD;  Location: [facility];  Service:  Urology;  Laterality: N/A;   HERNIA REPAIR Right    right inguinal hernia yrs ago   SPACE OAR INSTILLATION N/A 06/08/2022   Procedure: SPACE OAR INSTILLATION;  Surgeon: Sebastian Ache, MD;  Location: [facility];  Service: Urology;  Laterality: N/A;   TEE WITHOUT CARDIOVERSION N/A 02/19/2017   Procedure: TRANSESOPHAGEAL ECHOCARDIOGRAM (TEE);  Surgeon: Lars Masson, MD;  Location: [facility] ENDOSCOPY;  Service: Cardiovascular;  Laterality: N/A;   Patient Active Problem List   Diagnosis Date Noted   Malignant neoplasm of prostate (HCC) 04/18/2022   Hardening of the aorta (main artery of the heart) (HCC) 10/22/2021   Ex-smoker 10/22/2021   Chronic neck pain 10/22/2021   Controlled type 2 diabetes mellitus without complication (HCC) 10/22/2021   Peripheral sensory neuropathy due to type 2 diabetes mellitus (HCC) 10/22/2021   Heart murmur 05/30/2021   Pulmonary emphysema (HCC) 11/22/2020   Solitary pulmonary nodule 11/22/2020   Spinal stenosis of lumbar region 11/14/2019   Pain of both hip joints 11/04/2019   Seasonal allergic rhinitis 03/25/2019   Vitamin D deficiency  12/08/2018   Type 2 diabetes mellitus without complication (HCC) 12/08/2018   Mixed hyperlipidemia 12/08/2018   Minimal cognitive impairment 12/08/2018   Lumbar spondylosis 12/08/2018   History of cerebrovascular accident 12/08/2018   Cervical spondylosis without myelopathy 12/08/2018   Enlarged prostate 12/08/2018   Prediabetes 11/26/2018   Polyp of colon 11/26/2018   Kidney stone 11/26/2018   Hyperplasia of prostate 11/26/2018   Herpes zoster 11/26/2018   Gastric ulcer 11/26/2018   Fracture of calcaneus 11/26/2018   Coronary arteriosclerosis 11/26/2018   Elevated troponin    Stroke (cerebrum) (HCC) 02/17/2017   Cerebrovascular accident (CVA) (HCC)    OSA on CPAP    Suspected cerebrovascular accident (CVA) 02/16/2017   Hypertension 02/16/2017   Hypertensive urgency 02/16/2017    PCP: Harvest Forest, MD  REFERRING PROVIDER: Harvest Forest, MD  REFERRING DIAG: M17.12 (ICD-10-CM) - Unilateral primary osteoarthritis, left knee; M54.5 low back pain, unspecified; G89.29 other chronic pain  THERAPY DIAG:  Muscle weakness (generalized)  Left knee pain, unspecified chronicity  Other low back pain  Rationale for Evaluation and Treatment: Rehabilitation  ONSET DATE: 12/03/2023 (referral date)  SUBJECTIVE:   SUBJECTIVE STATEMENT: Pt reports that he was sore after last PT session. He has been working on his HEP, not every day though. See below for pain today. Pt denies any falls or other acute changes since last visit.  PERTINENT HISTORY: PMH: Atherosclerosis of coronary artery without angina pectoris, PVD, prostate CA, hypercalcemia, anemia, L knee OA, BPH, DMII, CVA, gastric ulcer, lumbar spondylosis, cervical spondlyosis without myelopathy, OSA, HTN, HLD, lumbar spinal stenosis, emphysema, heart murmur, peripheral neuropathy  PAIN:  Are you having pain? Yes: NPRS scale: 1/10  Pain location: L knee along medial joint line Pain description: dull ache Aggravating factors: N/A Relieving factors: voltarin, hasn't tried anything else  Yes: NPRS scale: 3/10, constant Pain location: lower back Pain description: dull ache Aggravating factors: walking without leaning forwards on something (like a shopping cart) Relieving factors: leaning forwards on something in standing  PRECAUTIONS: None  RED FLAGS: None   WEIGHT BEARING RESTRICTIONS: No  FALLS:  Has patient fallen in last 6 months? 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  DIAGNOSTIC FINDINGS:  No recent imaging of lumbar spine  L knee xray 11/27/23 FINDINGS: Tiny joint effusion. Mild lateral patellar facet peripheral degenerative spurring. Mild medial and lateral trochlear degenerative spurring. No acute fracture or dislocation. Moderate to high-grade atherosclerotic calcifications.   IMPRESSION: 1. Mild patellofemoral osteoarthritis. 2. Tiny joint effusion.  PATIENT SURVEYS:  KOOS : 60%  COGNITION: Overall cognitive status:  mild memory impairments due to history of CVA      SENSATION: No N/T in legs, has some N/T in his big toe from spinal stenosis  EDEMA:  Mild edema in L knee as compared to R knee  POSTURE: rounded shoulders and forward head  PALPATION: No TTP, L patellar mobility WFL  LOWER EXTREMITY ROM:  Active ROM  Right eval Left eval  Hip flexion    Hip extension    Hip abduction    Hip adduction    Hip internal rotation    Hip external rotation    Knee flexion [facility] WFL  Knee extension [facility]  Ankle dorsiflexion    Ankle plantarflexion    Ankle inversion    Ankle eversion     (Blank rows = not tested)  LOWER EXTREMITY MMT:  MMT Right eval Left eval  Hip flexion 5 5  Hip extension    Hip abduction    Hip adduction    Hip internal rotation    Hip external rotation    Knee flexion 5 5  Knee extension 5 5  Ankle dorsiflexion 5 5  Ankle plantarflexion    Ankle inversion    Ankle eversion     (Blank rows = not tested)  LOWER EXTREMITY SPECIAL TESTS:  Knee special tests: Anterior drawer test: negative, Posterior drawer test: negative, Apley's compression test: negative, McMurray's test: negative, and Patellafemoral apprehension test: negative  LUMBAR ROM:    Active  A/PROM  eval  Flexion WFL   Extension 75%, no pain  Right lateral flexion 25%, pain in lower back  Left lateral flexion 25%, pain in lower back  Right rotation WFL  Left rotation WFL   (Blank rows = not tested)    GAIT: Distance walked: various clinic distances Assistive device utilized: None Level of assistance: Complete Independence Comments: mild antalgia                                                                                                                               TREATMENT:  TherEx Review of questions over HEP: Prone scorpion stretch x 10 reps B Supine straight leg lumbar rotation stretch x 10 reps B Supine piriformis stretch with rotation x 10 reps B Pt feels some stretching in his L groin area with this  Long-sitting butterfly stretch 3 x 30 sec each B  To work on core strengthening and BLE strengthening: Supine march position hold with alt heel tap on mat 2 x 10 reps B Farmer's carry 2 x 115 ft each with 10# KB progression to 15# KB alt UE Deadlift progression with use of 6" step 2 x 10 reps Progression to 5# KB to 10# KB Tends to lean posteriorly due to tight HS Spanish squats 3 x 10 reps with blue bungee   PATIENT EDUCATION:  Education details: reviewed questions over HEP, continue HEP Person educated: Patient Education method: Medical illustrator Education comprehension: verbalized understanding, returned demonstration, and needs further education  HOME EXERCISE PROGRAM: Access Code: [email or web address] URL: [email or web address] Date: 01/08/2024 Prepared by: Maryruth Eve  Exercises - Supine Bridge  - 1 x daily - 7 x weekly - 3 sets - 10 reps - 5 sec hold - Supine March  - 1 x daily - 7 x weekly - 3 sets - 10 reps -  Butterfly Groin Stretch  - 1 x daily - 7 x weekly - 1 sets - 3-5 reps - 30-60 sec hold - Seated Hamstring Curl with Anchored Resistance  - 1 x daily - 7 x weekly - 3 sets - 10 reps - Cat Cow to Child's Pose  - 1 x daily - 7 x weekly - 2 sets  - 10 reps - Cobra  - 1 x daily - 7 x weekly - 3 sets - 10 reps - Supine Straight Leg Lumbar Rotation Stretch  - 1 x daily - 7 x weekly - 3 sets - 5 reps - Scorpion  - 1 x daily - 7 x weekly - 3 sets - 5 reps - Seated Table Hamstring Stretch  - 1 x daily - 7 x weekly - 3 sets - 30 seconds  hold   ASSESSMENT:  CLINICAL IMPRESSION: Emphasis of skilled PT session on reviewing his questions over his HEP, performed demonstration of exercises and patient able to perform return demo. Remainder of session focused on trialing other core and LE stabilization exercises. Pt continues to benefit from skilled PT services to work towards increased independence with management of his back and knee pain. Continue POC.  OBJECTIVE IMPAIRMENTS: decreased activity tolerance, decreased mobility, difficulty walking, increased edema, impaired perceived functional ability, impaired sensation, postural dysfunction, and pain.   ACTIVITY LIMITATIONS: squatting and stairs  PARTICIPATION LIMITATIONS: community activity  PERSONAL FACTORS: Age, Fitness, Time since onset of injury/illness/exacerbation, and 3+ comorbidities:    Atherosclerosis of coronary artery without angina pectoris, PVD, prostate CA, hypercalcemia, anemia, L knee OA, BPH, DMII, CVA, gastric ulcer, lumbar spondylosis, cervical spondlyosis without myelopathy, OSA, HTN, HLD, lumbar spinal stenosis, emphysema, heart murmur, peripheral neuropathyare also affecting patient's functional outcome.   REHAB POTENTIAL: Good  CLINICAL DECISION MAKING: Stable/uncomplicated  EVALUATION COMPLEXITY: Low   GOALS: Goals reviewed with patient? Yes  SHORT TERM GOALS= LONG TERM GOALS due to length of POC  LONG TERM GOALS: Target date: 01/24/2024  Pt will be independent with final land and aquatic HEPs for improved strength and management of pain symptoms. Baseline:  Goal status: INITIAL  2.  Pt will improve his score on the KOOS to >/= 75% to demonstrate decreased  disability level Baseline: 60% of normal function (1/6) Goal status: INITIAL  3.  Pt will improve his score on the Oswestry to </= 14/50 to demonstrate decreased disability level Baseline: 17/50 - moderate disability (1/15) Goal status: INITIAL  4.  Pt will improve his lateral flexion lumbar ROM by 25% to demonstrate improved function. Baseline: 25% of normal L/R (1/15) Goal status: INITIAL  5.  Pt will report pain in L knee </= 7/10 at the most Baseline: 11/10 without voltarin (1/6) Goal status: INITIAL    PLAN:  PT FREQUENCY: 2x/week  PT DURATION: 4 weeks  PLANNED INTERVENTIONS: 97164- PT Re-evaluation, 97110-Therapeutic exercises, 97530- Therapeutic activity, 97112- Neuromuscular re-education, 97535- Self Care, [id number]- Manual therapy, [phone]- Gait training, [phone]- Aquatic Therapy, [phone]- Electrical stimulation (manual), Patient/Family education, Balance training, Stair training, Taping, Dry Needling, Joint mobilization, Spinal mobilization, DME instructions, Cryotherapy, and Moist heat  PLAN FOR NEXT SESSION: add to HEP to address L knee pain from OA and chronic low back pain from spinal stenosis, seated theraball stretch, resisted sit to stands, core stability, trial dead bugs and lifting mechanics as tolerated    Peter Congo, PT Peter Congo, PT, DPT, CSRS   01/13/2024, 2:43 PM

## 2024-01-15 ENCOUNTER — Ambulatory Visit: Payer: Medicare Other | Admitting: Physical Therapy

## 2024-01-15 ENCOUNTER — Encounter: Payer: Self-pay | Admitting: Physical Therapy

## 2024-01-15 VITALS — BP 125/62 | HR 97

## 2024-01-15 DIAGNOSIS — M6281 Muscle weakness (generalized): Secondary | ICD-10-CM | POA: Diagnosis not present

## 2024-01-15 DIAGNOSIS — M25562 Pain in left knee: Secondary | ICD-10-CM

## 2024-01-15 DIAGNOSIS — M5459 Other low back pain: Secondary | ICD-10-CM

## 2024-01-15 NOTE — Therapy (Signed)
OUTPATIENT PHYSICAL THERAPY LOWER EXTREMITY AND THORACOLUMBAR TREATMENT   Patient Name: John Finley MRN: [id number] DOB:Apr 20, 1951, 73 y.o., male Today's Date: 01/15/2024  END OF SESSION:  PT End of Session - 01/15/24 1402     Visit Number 6    Number of Visits 9    Date for PT Re-Evaluation 02/03/24    Authorization Type Medicare    PT Start Time 1402    PT Stop Time 1443    PT Time Calculation (min) 41 min    Equipment Utilized During Treatment Gait belt    Activity Tolerance Patient tolerated treatment well    Behavior During Therapy WFL for tasks assessed/performed              Past Medical History:  Diagnosis Date   Arrhythmia    pac's in 2018 per pt   BPH (benign prostatic hyperplasia)    takes cialis for   Cervical spondylosis without myelopathy    Chronic low back pain    Colon polyp    Coronary artery disease    Gastric ulcer    yrs ago   Herniated cervical disc    x2 numbness in left pinkie finger @ times, cannot sleep on right side,both arms get numb   Herpes zoster    hx of shingles more than 20 yrs ago both sides of back   High cholesterol    history of Pre-diabetes    Hypertension    Kidney stone    LVH (left ventricular hypertrophy)    Minimal cognitive impairment    Prostate cancer (HCC)    S/P tooth extraction 05/28/2022   seeing dentist 06-06-2022 for tooth pain ? dry socket   Seasonal allergic rhinitis    Sleep apnea    uses cpap 18 mm mercury   Spinal stenosis of lumbar region    Stroke (cerebrum) (HCC) 2018   Tick bite    area right epigastric  red with small  ulceration no drainage per pt on 06-05-2022   Type 2 diabetes mellitus (HCC)    Vitamin D deficiency    Wears dentures upper    Past Surgical History:  Procedure Laterality Date   ANKLE SURGERY Left    yrs go   GOLD SEED IMPLANT N/A 06/08/2022   Procedure: GOLD SEED IMPLANT;  Surgeon: Sebastian Ache, MD;  Location: [facility] [place];  Service: Urology;   Laterality: N/A;   HERNIA REPAIR Right    right inguinal hernia yrs ago   SPACE OAR INSTILLATION N/A 06/08/2022   Procedure: SPACE OAR INSTILLATION;  Surgeon: Sebastian Ache, MD;  Location: [facility];  Service: Urology;  Laterality: N/A;   TEE WITHOUT CARDIOVERSION N/A 02/19/2017   Procedure: TRANSESOPHAGEAL ECHOCARDIOGRAM (TEE);  Surgeon: Lars Masson, MD;  Location: [facility] ENDOSCOPY;  Service: Cardiovascular;  Laterality: N/A;   Patient Active Problem List   Diagnosis Date Noted   Malignant neoplasm of prostate (HCC) 04/18/2022   Hardening of the aorta (main artery of the heart) (HCC) 10/22/2021   Ex-smoker 10/22/2021   Chronic neck pain 10/22/2021   Controlled type 2 diabetes mellitus without complication (HCC) 10/22/2021   Peripheral sensory neuropathy due to type 2 diabetes mellitus (HCC) 10/22/2021   Heart murmur 05/30/2021   Pulmonary emphysema (HCC) 11/22/2020   Solitary pulmonary nodule 11/22/2020   Spinal stenosis of lumbar region 11/14/2019   Pain of both hip joints 11/04/2019   Seasonal allergic rhinitis 03/25/2019   Vitamin D deficiency 12/08/2018   Type  2 diabetes mellitus without complication (HCC) 12/08/2018   Mixed hyperlipidemia 12/08/2018   Minimal cognitive impairment 12/08/2018   Lumbar spondylosis 12/08/2018   History of cerebrovascular accident 12/08/2018   Cervical spondylosis without myelopathy 12/08/2018   Enlarged prostate 12/08/2018   Prediabetes 11/26/2018   Polyp of colon 11/26/2018   Kidney stone 11/26/2018   Hyperplasia of prostate 11/26/2018   Herpes zoster 11/26/2018   Gastric ulcer 11/26/2018   Fracture of calcaneus 11/26/2018   Coronary arteriosclerosis 11/26/2018   Elevated troponin    Stroke (cerebrum) (HCC) 02/17/2017   Cerebrovascular accident (CVA) (HCC)    OSA on CPAP    Suspected cerebrovascular accident (CVA) 02/16/2017   Hypertension 02/16/2017   Hypertensive urgency 02/16/2017    PCP: Harvest Forest,  MD  REFERRING PROVIDER: Harvest Forest, MD  REFERRING DIAG: M17.12 (ICD-10-CM) - Unilateral primary osteoarthritis, left knee; M54.5 low back pain, unspecified; G89.29 other chronic pain  THERAPY DIAG:  Muscle weakness (generalized)  Left knee pain, unspecified chronicity  Other low back pain  Rationale for Evaluation and Treatment: Rehabilitation  ONSET DATE: 12/03/2023 (referral date)  SUBJECTIVE:   SUBJECTIVE STATEMENT: Pt reports that he is doing well. Reports he has been a good kind of muscle sore. Denies falls and near falls.  PERTINENT HISTORY: PMH: Atherosclerosis of coronary artery without angina pectoris, PVD, prostate CA, hypercalcemia, anemia, L knee OA, BPH, DMII, CVA, gastric ulcer, lumbar spondylosis, cervical spondlyosis without myelopathy, OSA, HTN, HLD, lumbar spinal stenosis, emphysema, heart murmur, peripheral neuropathy  PAIN:  Are you having pain? Yes: NPRS scale: 3/10  Pain location: L knee along medial joint line Pain description: dull ache Aggravating factors: N/A Relieving factors: voltarin, hasn't tried anything else  Yes: NPRS scale: 3/10, constant Pain location: lower back Pain description: dull ache Aggravating factors: walking without leaning forwards on something (like a shopping cart) Relieving factors: leaning forwards on something in standing  PRECAUTIONS: None  RED FLAGS: None   WEIGHT BEARING RESTRICTIONS: No  FALLS:  Has patient fallen in last 6 months? 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  DIAGNOSTIC FINDINGS:  No recent imaging of lumbar spine  L knee xray 11/27/23 FINDINGS: Tiny joint effusion. Mild lateral patellar facet peripheral degenerative spurring. Mild medial and lateral trochlear degenerative spurring. No acute fracture or dislocation. Moderate to high-grade atherosclerotic calcifications.   IMPRESSION: 1. Mild patellofemoral osteoarthritis. 2. Tiny joint effusion.  PATIENT SURVEYS:  KOOS : 60%  COGNITION: Overall cognitive status:  mild memory impairments due to history of CVA      SENSATION: No N/T in legs, has some N/T in his big toe from spinal stenosis  EDEMA:  Mild edema in L knee as compared to R knee  POSTURE: rounded shoulders and forward head  PALPATION: No TTP, L patellar mobility WFL  LOWER EXTREMITY ROM:  Active ROM Right eval Left eval  Hip flexion    Hip extension    Hip abduction  Hip adduction    Hip internal rotation    Hip external rotation    Knee flexion [facility] WFL  Knee extension [facility]  Ankle dorsiflexion    Ankle plantarflexion    Ankle inversion    Ankle eversion     (Blank rows = not tested)  LOWER EXTREMITY MMT:  MMT Right eval Left eval  Hip flexion 5 5  Hip extension    Hip abduction    Hip adduction    Hip internal rotation    Hip external rotation    Knee flexion 5 5  Knee extension 5 5  Ankle dorsiflexion 5 5  Ankle plantarflexion    Ankle inversion    Ankle eversion     (Blank rows = not tested)  LOWER EXTREMITY SPECIAL TESTS:  Knee special tests: Anterior drawer test: negative, Posterior drawer test: negative, Apley's compression test: negative, McMurray's test: negative, and Patellafemoral apprehension test: negative  LUMBAR ROM:    Active  A/PROM  eval  Flexion WFL  Extension 75%, no pain  Right lateral flexion 25%, pain in lower back  Left lateral  flexion 25%, pain in lower back  Right rotation WFL  Left rotation WFL   (Blank rows = not tested)    GAIT: Distance walked: various clinic distances Assistive device utilized: None Level of assistance: Complete Independence Comments: mild antalgia                                                                                                                               TREATMENT:    01/15/2024    2:34 PM 01/15/2024    2:07 PM 01/15/2024    2:05 PM  Vitals with BMI  Systolic 125 109 536  Diastolic 62 65 53  Pulse 97  79        End of session  Standing    Sitting start  TherAct: BP assessed seated on LUE at rest, BP lower today as patient reports just taking BP medication, increases in standing and patient denies feeling any dizziness, will monitor closely and continue session  Vitals assessed again towards end of session and continue to gradually increase and WFL for therapy  TherEx:  Dynamic Warmup 2 x 20 feet for maximize LE and proximal mobility High knees slow march with farmers carry with 12lb kettle bell Glute kicks with chess hold 12lb kettle bell Tin man with behind back shoulder extension hold of 12 lb kettle bell Lateral "fence" step overs with 12 lb kettle bell chess hold  Strength work with emphasis on quad, hamstring, and glute strengthening for long term stability and management of pain Double leg dead lift with 12lb kettle bell 1 x 12, 1 x 10 (cues for hip hinge instead of lumbar engagement) Modified single leg dead lift with back leg on ground 2 x 8 reps with 12lb kettle bell Lateral lunge for adductor stretch and hip strengthening 2 x 10 reps bil  with 12lb kettle bell hold Lunges with single UE support on ballet bar for LE and hip strengthening 2 x 10 reps  SciFit single peak setting level 7 for 3 minutes using BUE/BLEs for end of session finisher for reciprocal movement, dynamic cardiovascular challenge at end of session and increased amplitude of  stepping. Min cues to maintain > 80 spm, patient maintains throughout RPE: Max fatigue, ends at 3 minutes instead of making it to 6 minutes due to max effort, requires cues on pacing moving forward  PATIENT EDUCATION:  Education details: Continue HEP Person educated: Patient Education method: Medical illustrator Education comprehension: verbalized understanding, returned demonstration, and needs further education  HOME EXERCISE PROGRAM: Access Code: [email or web address] URL: [email or web address] Date: 01/08/2024 Prepared by: Maryruth Eve  Exercises - Supine Bridge  - 1 x daily - 7 x weekly - 3 sets - 10 reps - 5 sec hold - Supine March  - 1 x daily - 7 x weekly - 3 sets - 10 reps - Butterfly Groin Stretch  - 1 x daily - 7 x weekly - 1 sets - 3-5 reps - 30-60 sec hold - Seated Hamstring Curl with Anchored Resistance  - 1 x daily - 7 x weekly - 3 sets - 10 reps - Cat Cow to Child's Pose  - 1 x daily - 7 x weekly - 2 sets - 10 reps - Cobra  - 1 x daily - 7 x weekly - 3 sets - 10 reps - Supine Straight Leg Lumbar Rotation Stretch  - 1 x daily - 7 x weekly - 3 sets - 5 reps - Scorpion  - 1 x daily - 7 x weekly - 3 sets - 5 reps - Seated Table Hamstring Stretch  - 1 x daily - 7 x weekly - 3 sets - 30 seconds  hold   ASSESSMENT:  CLINICAL IMPRESSION: Emphasis of skilled PT session on progressing strength and mobility work. Patient is doing excellently. Form on dead lift continues to be improved with min cues from prior session. Patient reports no increase in pain during session. Continue POC.  OBJECTIVE IMPAIRMENTS: decreased activity tolerance, decreased mobility, difficulty walking, increased edema, impaired perceived functional ability, impaired sensation, postural dysfunction, and pain.   ACTIVITY LIMITATIONS: squatting and stairs  PARTICIPATION LIMITATIONS: community activity  PERSONAL FACTORS: Age, Fitness, Time since onset of injury/illness/exacerbation, and 3+  comorbidities:    Atherosclerosis of coronary artery without angina pectoris, PVD, prostate CA, hypercalcemia, anemia, L knee OA, BPH, DMII, CVA, gastric ulcer, lumbar spondylosis, cervical spondlyosis without myelopathy, OSA, HTN, HLD, lumbar spinal stenosis, emphysema, heart murmur, peripheral neuropathyare also affecting patient's functional outcome.   REHAB POTENTIAL: Good  CLINICAL DECISION MAKING: Stable/uncomplicated  EVALUATION COMPLEXITY: Low   GOALS: Goals reviewed with patient? Yes  SHORT TERM GOALS= LONG TERM GOALS due to length of POC  LONG TERM GOALS: Target date: 01/24/2024  Pt will be independent with final land and aquatic HEPs for improved strength and management of pain symptoms. Baseline:  Goal status: INITIAL  2.  Pt will improve his score on the KOOS to >/= 75% to demonstrate decreased disability level Baseline: 60% of normal function (1/6) Goal status: INITIAL  3.  Pt will improve his score on the Oswestry to </= 14/50 to demonstrate decreased disability level Baseline: 17/50 - moderate disability (1/15) Goal status: INITIAL  4.  Pt will improve his lateral flexion lumbar ROM by 25% to demonstrate improved function. Baseline: 25% of normal L/R (1/15) Goal status:  INITIAL  5.  Pt will report pain in L knee </= 7/10 at the most Baseline: 11/10 without voltarin (1/6) Goal status: INITIAL    PLAN:  PT FREQUENCY: 2x/week  PT DURATION: 4 weeks  PLANNED INTERVENTIONS: [id number]- PT Re-evaluation, 97110-Therapeutic exercises, 97530- Therapeutic activity, 97112- Neuromuscular re-education, 97535- Self Care, [id number]- Manual therapy, [phone]- Gait training, [phone]- Aquatic Therapy, [phone]- Electrical stimulation (manual), Patient/Family education, Balance training, Stair training, Taping, Dry Needling, Joint mobilization, Spinal mobilization, DME instructions, Cryotherapy, and Moist heat  PLAN FOR NEXT SESSION: add to HEP to address L knee pain from OA and chronic low  back pain from spinal stenosis, seated theraball stretch, resisted sit to stands, core stability, trial dead bugs and progress weight for lifting mechanics, patient may be nearing D/C   Carmelia Bake, PT, DPT 01/15/2024, 2:50 PM

## 2024-01-20 ENCOUNTER — Ambulatory Visit: Payer: Medicare Other | Admitting: Physical Therapy

## 2024-01-23 ENCOUNTER — Ambulatory Visit: Payer: Medicare Other | Attending: Internal Medicine | Admitting: Physical Therapy

## 2024-01-23 DIAGNOSIS — M6281 Muscle weakness (generalized): Secondary | ICD-10-CM | POA: Insufficient documentation

## 2024-01-23 DIAGNOSIS — M5459 Other low back pain: Secondary | ICD-10-CM | POA: Insufficient documentation

## 2024-01-23 DIAGNOSIS — M25562 Pain in left knee: Secondary | ICD-10-CM | POA: Insufficient documentation

## 2024-01-23 NOTE — Therapy (Signed)
 OUTPATIENT PHYSICAL THERAPY LOWER EXTREMITY AND THORACOLUMBAR TREATMENT- DISCHARGE SUMMARY    Patient Name: John Finley MRN: [id number] DOB:05/28/1951, 73 y.o., male Today's Date: 01/23/2024   PHYSICAL THERAPY DISCHARGE SUMMARY  Visits from Start of Care: 7  Current functional level related to goals / functional outcomes: Independent w/all ADLs    Remaining deficits: Mild-moderate L knee pain, mild low back pain   Education / Equipment: HEP   Patient agrees to discharge. Patient goals were partially met. Patient is being discharged due to being pleased with the current functional level.   END OF SESSION:  PT End of Session - 01/23/24 1451     Visit Number 7    Number of Visits 9    Date for PT Re-Evaluation 02/03/24    Authorization Type Medicare    PT Start Time 1449    PT Stop Time 1509   DC   PT Time Calculation (min) 20 min    Equipment Utilized During Treatment --    Activity Tolerance Patient tolerated treatment well    Behavior During Therapy WFL for tasks assessed/performed              Past Medical History:  Diagnosis Date   Arrhythmia    pac's in 2018 per pt   BPH (benign prostatic hyperplasia)    takes cialis for   Cervical spondylosis without myelopathy    Chronic low back pain    Colon polyp    Coronary artery disease    Gastric ulcer    yrs ago   Herniated cervical disc    x2 numbness in left pinkie finger @ times, cannot sleep on right side,both arms get numb   Herpes zoster    hx of shingles more than 20 yrs ago both sides of back   High cholesterol    history of Pre-diabetes    Hypertension    Kidney stone    LVH (left ventricular hypertrophy)    Minimal cognitive impairment    Prostate cancer (HCC)    S/P tooth extraction 05/28/2022   seeing dentist 06-06-2022 for tooth pain ? dry socket   Seasonal allergic rhinitis    Sleep apnea    uses cpap 18 mm mercury   Spinal stenosis of lumbar region    Stroke (cerebrum) (HCC) 2018    Tick bite    area right epigastric  red with small  ulceration no drainage per pt on 06-05-2022   Type 2 diabetes mellitus (HCC)    Vitamin D deficiency    Wears dentures upper    Past Surgical History:  Procedure Laterality Date   ANKLE SURGERY Left    yrs go   GOLD SEED IMPLANT N/A 06/08/2022   Procedure: GOLD SEED IMPLANT;  Surgeon: Alvaro Hummer, MD;  Location: [facility] [place];  Service: Urology;  Laterality: N/A;   HERNIA REPAIR Right    right inguinal hernia yrs ago   SPACE OAR INSTILLATION N/A 06/08/2022   Procedure: SPACE OAR INSTILLATION;  Surgeon: Alvaro Hummer, MD;  Location: [facility];  Service: Urology;  Laterality: N/A;   TEE WITHOUT CARDIOVERSION N/A 02/19/2017   Procedure: TRANSESOPHAGEAL ECHOCARDIOGRAM (TEE);  Surgeon: Leim VEAR Moose, MD;  Location: [facility] ENDOSCOPY;  Service: Cardiovascular;  Laterality: N/A;   Patient Active Problem List   Diagnosis Date Noted   Malignant neoplasm of prostate (HCC) 04/18/2022   Hardening of the aorta (main artery of the heart) (HCC) 10/22/2021   Ex-smoker 10/22/2021   Chronic neck  pain 10/22/2021   Controlled type 2 diabetes mellitus without complication (HCC) 10/22/2021   Peripheral sensory neuropathy due to type 2 diabetes mellitus (HCC) 10/22/2021   Heart murmur 05/30/2021   Pulmonary emphysema (HCC) 11/22/2020   Solitary pulmonary nodule 11/22/2020   Spinal stenosis of lumbar region 11/14/2019   Pain of both hip joints 11/04/2019   Seasonal allergic rhinitis 03/25/2019   Vitamin D deficiency 12/08/2018   Type 2 diabetes mellitus without complication (HCC) 12/08/2018   Mixed hyperlipidemia 12/08/2018   Minimal cognitive impairment 12/08/2018   Lumbar spondylosis 12/08/2018   History of cerebrovascular accident 12/08/2018   Cervical spondylosis without myelopathy 12/08/2018   Enlarged prostate 12/08/2018   Prediabetes 11/26/2018   Polyp of colon 11/26/2018   Kidney stone 11/26/2018    Hyperplasia of prostate 11/26/2018   Herpes zoster 11/26/2018   Gastric ulcer 11/26/2018   Fracture of calcaneus 11/26/2018   Coronary arteriosclerosis 11/26/2018   Elevated troponin    Stroke (cerebrum) (HCC) 02/17/2017   Cerebrovascular accident (CVA) (HCC)    OSA on CPAP    Suspected cerebrovascular accident (CVA) 02/16/2017   Hypertension 02/16/2017   Hypertensive urgency 02/16/2017    PCP: Roanna Ezekiel NOVAK, MD  REFERRING PROVIDER: Roanna Ezekiel NOVAK, MD  REFERRING DIAG: M17.12 (ICD-10-CM) - Unilateral primary osteoarthritis, left knee; M54.5 low back pain, unspecified; G89.29 other chronic pain  THERAPY DIAG:  Muscle weakness (generalized)  Left knee pain, unspecified chronicity  Other low back pain  Rationale for Evaluation and Treatment: Rehabilitation  ONSET DATE: 12/03/2023 (referral date)  SUBJECTIVE:   SUBJECTIVE STATEMENT: Pt reports that he is doing well. Reports he has been a good kind of muscle sore. Denies falls and near falls. Feels ready to DC today.   PERTINENT HISTORY: PMH: Atherosclerosis of coronary artery without angina pectoris, PVD, prostate CA, hypercalcemia, anemia, L knee OA, BPH, DMII, CVA, gastric ulcer, lumbar spondylosis, cervical spondlyosis without myelopathy, OSA, HTN, HLD, lumbar spinal stenosis, emphysema, heart murmur, peripheral neuropathy  PAIN:  Are you having pain? Yes: NPRS scale: 3/10  Pain location: L knee along medial joint line Pain description: dull ache Aggravating factors: N/A Relieving factors: voltarin, hasn't tried anything else  Yes: NPRS scale: 3/10, constant Pain location: lower back Pain description: dull ache Aggravating factors: walking without leaning forwards on something (like a shopping cart) Relieving factors: leaning forwards on something in standing  PRECAUTIONS: None  RED FLAGS: None   WEIGHT BEARING RESTRICTIONS: No  FALLS:  Has patient fallen in last 6 months? 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  DIAGNOSTIC FINDINGS:  No recent imaging of lumbar spine  L knee xray 11/27/23 FINDINGS: Tiny joint effusion. Mild lateral patellar facet peripheral degenerative spurring. Mild medial and lateral trochlear degenerative spurring. No acute fracture or dislocation. Moderate to high-grade atherosclerotic calcifications.   IMPRESSION: 1. Mild patellofemoral osteoarthritis. 2. Tiny joint effusion.  PATIENT SURVEYS:  KOOS : 60%  COGNITION: Overall cognitive status:  mild memory  impairments due to history of CVA      SENSATION: No N/T in legs, has some N/T in his big toe from spinal stenosis  EDEMA:  Mild edema in L knee as compared to R knee  POSTURE: rounded shoulders and forward head  PALPATION: No TTP, L patellar mobility WFL  LOWER EXTREMITY ROM:  Active ROM Right eval Left eval  Hip flexion    Hip extension    Hip abduction    Hip adduction    Hip internal rotation    Hip external rotation    Knee flexion [facility] WFL  Knee extension [facility]  Ankle dorsiflexion    Ankle plantarflexion    Ankle inversion    Ankle eversion     (Blank rows = not tested)  LOWER EXTREMITY MMT:  MMT Right eval Left eval  Hip flexion 5 5  Hip extension    Hip abduction    Hip adduction    Hip internal rotation    Hip external rotation    Knee flexion 5 5  Knee extension 5 5  Ankle dorsiflexion 5 5  Ankle  plantarflexion    Ankle inversion    Ankle eversion     (Blank rows = not tested)  LOWER EXTREMITY SPECIAL TESTS:  Knee special tests: Anterior drawer test: negative, Posterior drawer test: negative, Apley's compression test: negative, McMurray's test: negative, and Patellafemoral apprehension test: negative  LUMBAR ROM:    Active  A/PROM  eval A/ROM 01/23/24  Flexion WFL   Extension 75%, no pain   Right lateral flexion 25%, pain in lower back ~50%, no pain   Left lateral flexion 25%, pain in lower back ~50%, no pain   Right rotation WFL   Left rotation WFL    (Blank rows = not tested)    GAIT: Distance walked: various clinic distances Assistive device utilized: None Level of assistance: Complete Independence Comments: mild antalgia                                                                                                                               TREATMENT:  TherAct: Modified Oswestry: 8/50 KOOS: 67% Pt reports his left knee pain gets up to 7/10 at its worst and down to 3-4/10 at its best. Pt states he has not had to use Voltaren as much on it since starting PT.  Reassessed lumbar lateral flexion ROM (see above)     PATIENT EDUCATION:  Education details: Goal results, how to return to PT if pain levels/mobility changes  Person educated: Patient Education method: Medical Illustrator Education comprehension: verbalized understanding  HOME EXERCISE PROGRAM: Access Code: [email or web address] URL: [email or web address] Date: 01/08/2024 Prepared by: Lauraine Grumbling  Exercises - Supine Bridge  - 1 x daily - 7 x weekly - 3 sets - 10 reps - 5 sec hold - Supine March  - 1 x daily - 7 x weekly - 3 sets - 10 reps - Butterfly  Groin Stretch  - 1 x daily - 7 x weekly - 1 sets - 3-5 reps - 30-60 sec hold - Seated Hamstring Curl with Anchored Resistance  - 1 x daily - 7 x weekly - 3 sets - 10 reps - Cat Cow to Child's Pose  - 1 x daily - 7 x weekly - 2 sets -  10 reps - Cobra  - 1 x daily - 7 x weekly - 3 sets - 10 reps - Supine Straight Leg Lumbar Rotation Stretch  - 1 x daily - 7 x weekly - 3 sets - 5 reps - Scorpion  - 1 x daily - 7 x weekly - 3 sets - 5 reps - Seated Table Hamstring Stretch  - 1 x daily - 7 x weekly - 3 sets - 30 seconds  hold   ASSESSMENT:  CLINICAL IMPRESSION: Emphasis of skilled PT session on LTG assessment and DC from PT. Pt has met 4 of 5 LTGs, improving his score on Modified Oswestry and demonstrating ~50% A/ROM of lumbar spine in lateral flexion bilaterally, indicative of minimal disability. Pt did improve his score on KOOS by 7%, but not quite to goal level. Pt continues to report about 3-4/10 pain in L knee and low back, but states pain increases to 7/10 at its worst compared to 11/10 on eval. Pt reports he has been relying on Voltaren less for pain management and is overall moving better than before and is in agreement to DC.    OBJECTIVE IMPAIRMENTS: decreased activity tolerance, decreased mobility, difficulty walking, increased edema, impaired perceived functional ability, impaired sensation, postural dysfunction, and pain.   ACTIVITY LIMITATIONS: squatting and stairs  PARTICIPATION LIMITATIONS: community activity  PERSONAL FACTORS: Age, Fitness, Time since onset of injury/illness/exacerbation, and 3+ comorbidities:    Atherosclerosis of coronary artery without angina pectoris, PVD, prostate CA, hypercalcemia, anemia, L knee OA, BPH, DMII, CVA, gastric ulcer, lumbar spondylosis, cervical spondlyosis without myelopathy, OSA, HTN, HLD, lumbar spinal stenosis, emphysema, heart murmur, peripheral neuropathyare also affecting patient's functional outcome.   REHAB POTENTIAL: Good  CLINICAL DECISION MAKING: Stable/uncomplicated  EVALUATION COMPLEXITY: Low   GOALS: Goals reviewed with patient? Yes  SHORT TERM GOALS= LONG TERM GOALS due to length of POC  LONG TERM GOALS: Target date: 01/24/2024  Pt will be  independent with final land and aquatic HEPs for improved strength and management of pain symptoms. Baseline:  Goal status: MET  2.  Pt will improve his score on the KOOS to >/= 75% to demonstrate decreased disability level Baseline: 60% of normal function (1/6); 67% (2/6) Goal status: NOT MET  3.  Pt will improve his score on the Oswestry to </= 14/50 to demonstrate decreased disability level Baseline: 17/50 - moderate disability (1/15); 8/50 (2/6) Goal status: MET  4.  Pt will improve his lateral flexion lumbar ROM by 25% to demonstrate improved function. Baseline: 25% of normal L/R (1/15) Goal status: MET  5.  Pt will report pain in L knee </= 7/10 at the most Baseline: 11/10 without voltarin (1/6); 7/10 (2/6) Goal status: MET    PLAN:  PT FREQUENCY: 2x/week  PT DURATION: 4 weeks  PLANNED INTERVENTIONS: [id number]- PT Re-evaluation, 97110-Therapeutic exercises, 97530- Therapeutic activity, 97112- Neuromuscular re-education, 97535- Self Care, [id number]- Manual therapy, [phone]- Gait training, [phone]- Aquatic Therapy, [phone]- Electrical stimulation (manual), Patient/Family education, Balance training, Stair training, Taping, Dry Needling, Joint mobilization, Spinal mobilization, DME instructions, Cryotherapy, and Moist heat    Brina Umeda E Johnney Scarlata, PT, DPT 01/23/2024, 3:14  PM

## 2024-02-25 ENCOUNTER — Encounter: Payer: Self-pay | Admitting: Vascular Surgery

## 2024-04-15 ENCOUNTER — Encounter (HOSPITAL_COMMUNITY): Payer: Medicare Other

## 2024-04-15 ENCOUNTER — Ambulatory Visit: Payer: Medicare Other | Admitting: Vascular Surgery

## 2024-05-05 ENCOUNTER — Other Ambulatory Visit: Payer: Self-pay | Admitting: *Deleted

## 2024-05-05 DIAGNOSIS — I739 Peripheral vascular disease, unspecified: Secondary | ICD-10-CM

## 2024-05-05 DIAGNOSIS — I6523 Occlusion and stenosis of bilateral carotid arteries: Secondary | ICD-10-CM

## 2024-05-05 DIAGNOSIS — I70219 Atherosclerosis of native arteries of extremities with intermittent claudication, unspecified extremity: Secondary | ICD-10-CM

## 2024-05-13 ENCOUNTER — Ambulatory Visit (INDEPENDENT_AMBULATORY_CARE_PROVIDER_SITE_OTHER): Admitting: Vascular Surgery

## 2024-05-13 ENCOUNTER — Ambulatory Visit (HOSPITAL_COMMUNITY)
Admission: RE | Admit: 2024-05-13 | Discharge: 2024-05-13 | Disposition: A | Discharge status: Home / Self Care | Source: Ambulatory Visit | Attending: Vascular Surgery | Admitting: Vascular Surgery

## 2024-05-13 ENCOUNTER — Encounter: Payer: Self-pay | Admitting: Vascular Surgery

## 2024-05-13 VITALS — BP 115/73 | HR 71 | Temp 98.2°F | Ht 66.0 in | Wt 219.0 lb

## 2024-05-13 DIAGNOSIS — I739 Peripheral vascular disease, unspecified: Secondary | ICD-10-CM

## 2024-05-13 DIAGNOSIS — I70219 Atherosclerosis of native arteries of extremities with intermittent claudication, unspecified extremity: Secondary | ICD-10-CM

## 2024-05-13 DIAGNOSIS — I6523 Occlusion and stenosis of bilateral carotid arteries: Secondary | ICD-10-CM | POA: Diagnosis not present

## 2024-05-13 LAB — VAS US ABI WITH/WO TBI
Left ABI: 0.98
Right ABI: 0.92

## 2024-05-13 NOTE — Progress Notes (Signed)
 Patient ID: John Finley, male   DOB: 1951/08/30, 73 y.o.   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  He does have a history of a stroke which forced him to retire.  He has known carotid artery disease but unknown if this was the cause of his stroke.  He does take Plavix  daily.  He has limitation with memory secondary to his stroke.  He does walk without limitation has no symptoms of claudication, rest pain or tissue loss.  He was formerly followed by Dr. Ollie Bhat with cardiology for coronary artery disease and left ventricular hypertrophy.  Past Medical History:  Diagnosis Date   Arrhythmia    pac's in 2018 per pt   BPH (benign prostatic hyperplasia)    takes cialis for   Cervical spondylosis without myelopathy    Chronic low back pain    Colon polyp    Coronary artery disease    Gastric ulcer    yrs ago   Herniated cervical disc    x2 numbness in left pinkie finger @ times, cannot sleep on right side,both arms get numb   Herpes zoster    hx of shingles more than 20 yrs ago both sides of back   High cholesterol    history of Pre-diabetes    Hypertension    Kidney stone    LVH (left ventricular hypertrophy)    Minimal cognitive impairment    Prostate cancer (HCC)    S/P tooth extraction 05/28/2022   seeing dentist 06-06-2022 for tooth pain ? dry socket   Seasonal allergic rhinitis    Sleep apnea    uses cpap 18 mm mercury   Spinal stenosis of lumbar region    Stroke (cerebrum) (HCC) 2018   Tick bite    area right epigastric  red with small  ulceration no drainage per pt on 06-05-2022   Type 2 diabetes mellitus (HCC)    Vitamin D deficiency    Wears dentures upper    Family History  Problem Relation Age of Onset   Cancer - Other Mother    Diabetes Mother    Stroke Mother    Heart disease Mother     Alcohol abuse Mother    Hypertension Mother    Other Father        Accident   Hypertension Sister    Pulmonary embolism Sister    Diabetes Brother    Hypertension Brother    Stroke Brother    Depression Brother    Diabetes Brother    Past Surgical History:  Procedure Laterality Date   ANKLE SURGERY Left    yrs go   GOLD SEED IMPLANT N/A 06/08/2022   Procedure: GOLD SEED IMPLANT;  Surgeon: Osborn Blaze, MD;  Location: [facility];  Service: Urology;  Laterality: N/A;   HERNIA REPAIR Right    right inguinal hernia yrs ago   SPACE OAR INSTILLATION N/A 06/08/2022   Procedure: SPACE OAR INSTILLATION;  Surgeon: Osborn Blaze, MD;  Location: [facility];  Service: Urology;  Laterality: N/A;   TEE WITHOUT CARDIOVERSION N/A 02/19/2017   Procedure: TRANSESOPHAGEAL ECHOCARDIOGRAM (TEE);  Surgeon: Liza Riggers, MD;  Location: [facility] ENDOSCOPY;  Service: Cardiovascular;  Laterality: N/A;    Short Social History:  Social History   Tobacco Use  Smoking status: Former    Current packs/day: 0.00    Average packs/day: 2.0 packs/day for 30.0 years (60.0 ttl pk-yrs)    Types: Cigarettes    Start date: 38    Quit date: 2006    Years since quitting: 19.4   Smokeless tobacco: Never  Substance Use Topics   Alcohol use: No    Comment: Rare    Allergies  Allergen Reactions   Penicillins Itching and Rash   Sulfa Antibiotics Hives, Itching and Rash    Current Outpatient Medications  Medication Sig Dispense Refill   acyclovir (ZOVIRAX) 400 MG tablet Take 400 mg by mouth as needed.     alfuzosin  (UROXATRAL ) 10 MG 24 hr tablet TAKE 1 TABLET (10 MG TOTAL) BY MOUTH DAILY WITH BREAKFAST. (Patient taking differently: Take 5 mg by mouth daily with breakfast.) 90 tablet 1   amLODipine (NORVASC) 10 MG tablet Take 10 mg by mouth daily.      chlorhexidine (PERIDEX) 0.12 % solution Use as directed in the mouth or throat as needed.     clopidogrel  (PLAVIX ) 75 MG  tablet Take 1 tablet (75 mg total) by mouth daily. 90 tablet 3   Empagliflozin-metFORMIN HCl ER (SYNJARDY XR) 09-999 MG TB24 2 (two) times daily.     irbesartan (AVAPRO) 300 MG tablet Take 300 mg by mouth daily.     meclizine  (ANTIVERT ) 25 MG tablet Take 1 tablet (25 mg total) by mouth 3 (three) times daily as needed for dizziness. 30 tablet 0   metformin (FORTAMET) 1000 MG (OSM) 24 hr tablet Take 1,000 mg by mouth once.     ondansetron  (ZOFRAN -ODT) 4 MG disintegrating tablet Take 1 tablet (4 mg total) by mouth every 8 (eight) hours as needed for nausea or vomiting. 20 tablet 0   ORGOVYX 120 MG TABS Take 1 tablet by mouth daily.     rosuvastatin (CRESTOR) 20 MG tablet Take 20 mg by mouth daily.      Semaglutide (RYBELSUS) 3 MG TABS Take 3 mg by mouth 2 (two) times daily.     spironolactone (ALDACTONE) 50 MG tablet Take 50 mg by mouth daily.     No current facility-administered medications for this visit.    Review of Systems  Constitutional:  Constitutional negative. HENT: HENT negative.  Eyes: Eyes negative.  Respiratory: Respiratory negative.  Cardiovascular: Cardiovascular negative.  GI: Gastrointestinal negative.  Musculoskeletal: Musculoskeletal negative.  Skin: Skin negative.  Neurological:       Memory loss Hematologic: Hematologic/lymphatic negative.  Psychiatric: Psychiatric negative.        Objective:  Objective   Vitals:   05/13/24 1527 05/13/24 1529  BP: 113/72 115/73  Pulse: 71   Temp: 98.2 F (36.8 C)   SpO2: 92%   Weight: 219 lb (99.3 kg)   Height: 5\' 6"  (1.676 m)    Body mass index is 35.35 kg/m.  Physical Exam HENT:     Head: Normocephalic.     Mouth/Throat:     Mouth: Mucous membranes are moist.  Eyes:     Pupils: Pupils are equal, round, and reactive to light.  Neck:     Vascular: No carotid bruit.  Cardiovascular:     Rate and Rhythm: Normal rate.     Pulses: Normal pulses.  Pulmonary:     Effort: Pulmonary effort is normal.  Abdominal:      General: Abdomen is flat.  Musculoskeletal:        General: Normal range of motion.  Cervical back: Normal range of motion.     Right lower leg: No edema.     Left lower leg: No edema.  Skin:    General: Skin is warm.     Capillary Refill: Capillary refill takes less than 2 seconds.  Neurological:     General: No focal deficit present.     Mental Status: He is alert.  Psychiatric:        Mood and Affect: Mood normal.     Data: Right Carotid Findings:  +----------+--------+--------+--------+------------------+--------+           PSV cm/sEDV cm/sStenosisPlaque DescriptionComments  +----------+--------+--------+--------+------------------+--------+  CCA Prox  110     20                                          +----------+--------+--------+--------+------------------+--------+  CCA Distal149     26              heterogenous                +----------+--------+--------+--------+------------------+--------+  ICA Prox  71      13      1-39%   heterogenous                +----------+--------+--------+--------+------------------+--------+  ICA Mid   54      21              heterogenous                +----------+--------+--------+--------+------------------+--------+  ICA Distal40      15                                          +----------+--------+--------+--------+------------------+--------+  ECA      108     10                                          +----------+--------+--------+--------+------------------+--------+   +----------+--------+-------+----------------+-------------------+           PSV cm/sEDV cmsDescribe        Arm Pressure (mmHG)  +----------+--------+-------+----------------+-------------------+  [id number]    0      Multiphasic, FAO130                  +----------+--------+-------+----------------+-------------------+   +---------+--------+--+--------+-+---------+  VertebralPSV  cm/s21EDV cm/s6Antegrade  +---------+--------+--+--------+-+---------+   Left Carotid Findings:  +----------+--------+--------+--------+------------------+--------+           PSV cm/sEDV cm/sStenosisPlaque DescriptionComments  +----------+--------+--------+--------+------------------+--------+  CCA Prox  117     22                                          +----------+--------+--------+--------+------------------+--------+  CCA Distal104     22              homogeneous                 +----------+--------+--------+--------+------------------+--------+  ICA Prox  86      13      1-39%   heterogenous                +----------+--------+--------+--------+------------------+--------+  ICA Mid   71      11                                          +----------+--------+--------+--------+------------------+--------+  ICA Distal50      15                                          +----------+--------+--------+--------+------------------+--------+  ECA      79      6                                           +----------+--------+--------+--------+------------------+--------+   +----------+--------+--------+----------------+-------------------+           PSV cm/sEDV cm/sDescribe        Arm Pressure (mmHG)  +----------+--------+--------+----------------+-------------------+  [id number]    4       Multiphasic, [id number]                  +----------+--------+--------+----------------+-------------------+   +---------+--------+--+--------+--+---------+  VertebralPSV cm/s57EDV cm/s24Antegrade  +---------+--------+--+--------+--+---------+     Summary:  Right Carotid: Velocities in the right ICA are consistent with a 1-39%  stenosis.   Left Carotid: Velocities in the left ICA are consistent with a 1-39%  stenosis.   Vertebrals: Bilateral vertebral arteries demonstrate antegrade flow.  Subclavians: Normal flow hemodynamics  were seen in bilateral subclavian               arteries.   ABI Findings:  +---------+------------------+-----+-----------+--------+  Right   Rt Pressure (mmHg)IndexWaveform   Comment   +---------+------------------+-----+-----------+--------+  Brachial 103                                         +---------+------------------+-----+-----------+--------+  PTA     98                0.92 multiphasic          +---------+------------------+-----+-----------+--------+  DP      95                0.89 biphasic             +---------+------------------+-----+-----------+--------+  Great Toe64                0.60 Abnormal             +---------+------------------+-----+-----------+--------+   +---------+------------------+-----+--------+-------+  Left    Lt Pressure (mmHg)IndexWaveformComment  +---------+------------------+-----+--------+-------+  Brachial 107                                     +---------+------------------+-----+--------+-------+  PTA     105               0.98 biphasic         +---------+------------------+-----+--------+-------+  DP      93                0.87 biphasic         +---------+------------------+-----+--------+-------+  Raechel Bulla  0.69 Abnormal         +---------+------------------+-----+--------+-------+   +-------+-----------+-----------+------------+------------+  ABI/TBIToday's ABIToday's TBIPrevious ABIPrevious TBI  +-------+-----------+-----------+------------+------------+  Right 0.92       0.60       0.90        0.63          +-------+-----------+-----------+------------+------------+  Left  0.98       0.69       1.11        0.73          +-------+-----------+-----------+------------+------------+      Summary:  Right: Resting right ankle-brachial index indicates mild right lower  extremity arterial disease. The right toe-brachial index is abnormal.    Left: Resting left ankle-brachial index is within normal range. The left  toe-brachial index is abnormal.       Assessment/Plan:    73 year old male with asymptomatic mild stenosis of the bilateral carotid arteries as well as mostly preserved ABIs that is also asymptomatic.  As such I have recommended continued medical therapy and follow-up in 2 years with repeat noninvasive studies.  He does need to follow-up with cardiology as he has not been evaluated since Dr. Felipe Horton retired and we will place this referral today.     Adine Hoof MD [facility]

## 2024-06-09 ENCOUNTER — Ambulatory Visit: Attending: Internal Medicine | Admitting: Rehabilitative and Restorative Service Providers"

## 2024-06-09 ENCOUNTER — Encounter: Payer: Self-pay | Admitting: Rehabilitative and Restorative Service Providers"

## 2024-06-09 ENCOUNTER — Other Ambulatory Visit: Payer: Self-pay

## 2024-06-09 DIAGNOSIS — R42 Dizziness and giddiness: Secondary | ICD-10-CM | POA: Diagnosis present

## 2024-06-09 NOTE — Therapy (Signed)
 OUTPATIENT PHYSICAL THERAPY VESTIBULAR EVALUATION   Patient Name: John Finley MRN: [id number] DOB:1951/05/25, 73 y.o., male Today's Date: 06/09/2024  END OF SESSION:  PT End of Session - 06/09/24 1108     Visit Number 1    Number of Visits 4    Date for PT Re-Evaluation 07/09/24    Authorization Type medicare and [employer] of [place]    PT Start Time 1020    PT Stop Time 1100    PT Time Calculation (min) 40 min    Activity Tolerance Patient tolerated treatment well          Past Medical History:  Diagnosis Date   Arrhythmia    pac's in 2018 per pt   BPH (benign prostatic hyperplasia)    takes cialis for   Cervical spondylosis without myelopathy    Chronic low back pain    Colon polyp    Coronary artery disease    Gastric ulcer    yrs ago   Herniated cervical disc    x2 numbness in left pinkie finger @ times, cannot sleep on right side,both arms get numb   Herpes zoster    hx of shingles more than 20 yrs ago both sides of back   High cholesterol    history of Pre-diabetes    Hypertension    Kidney stone    LVH (left ventricular hypertrophy)    Minimal cognitive impairment    Prostate cancer (HCC)    S/P tooth extraction 05/28/2022   seeing dentist 06-06-2022 for tooth pain ? dry socket   Seasonal allergic rhinitis    Sleep apnea    uses cpap 18 mm mercury   Spinal stenosis of lumbar region    Stroke (cerebrum) (HCC) 2018   Tick bite    area right epigastric  red with small  ulceration no drainage per pt on 06-05-2022   Type 2 diabetes mellitus (HCC)    Vitamin D deficiency    Wears dentures upper    Past Surgical History:  Procedure Laterality Date   ANKLE SURGERY Left    yrs go   GOLD SEED IMPLANT N/A 06/08/2022   Procedure: GOLD SEED IMPLANT;  Surgeon: Alvaro Hummer, MD;  Location: [facility];  Service: Urology;  Laterality: N/A;   HERNIA REPAIR Right    right inguinal hernia yrs ago   SPACE OAR INSTILLATION N/A 06/08/2022    Procedure: SPACE OAR INSTILLATION;  Surgeon: Alvaro Hummer, MD;  Location: [facility];  Service: Urology;  Laterality: N/A;   TEE WITHOUT CARDIOVERSION N/A 02/19/2017   Procedure: TRANSESOPHAGEAL ECHOCARDIOGRAM (TEE);  Surgeon: Leim VEAR Moose, MD;  Location: [facility] ENDOSCOPY;  Service: Cardiovascular;  Laterality: N/A;   Patient Active Problem List   Diagnosis Date Noted   Malignant neoplasm of prostate (HCC) 04/18/2022   Hardening of the aorta (main artery of the heart) (HCC) 10/22/2021   Ex-smoker 10/22/2021   Chronic neck pain 10/22/2021   Controlled type 2 diabetes mellitus without complication (HCC) 10/22/2021   Peripheral sensory neuropathy due to type 2 diabetes mellitus (HCC) 10/22/2021   Heart murmur 05/30/2021   Pulmonary emphysema (HCC) 11/22/2020   Solitary pulmonary nodule 11/22/2020   Spinal stenosis of lumbar region 11/14/2019   Pain of both hip joints 11/04/2019   Seasonal allergic rhinitis 03/25/2019   Vitamin D deficiency 12/08/2018   Type 2 diabetes mellitus without complication (HCC) 12/08/2018   Mixed hyperlipidemia 12/08/2018   Minimal cognitive impairment 12/08/2018   Lumbar spondylosis  12/08/2018   History of cerebrovascular accident 12/08/2018   Cervical spondylosis without myelopathy 12/08/2018   Enlarged prostate 12/08/2018   Prediabetes 11/26/2018   Polyp of colon 11/26/2018   Kidney stone 11/26/2018   Hyperplasia of prostate 11/26/2018   Herpes zoster 11/26/2018   Gastric ulcer 11/26/2018   Fracture of calcaneus 11/26/2018   Coronary arteriosclerosis 11/26/2018   Elevated troponin    Stroke (cerebrum) (HCC) 02/17/2017   Cerebrovascular accident (CVA) (HCC)    OSA on CPAP    Suspected cerebrovascular accident (CVA) 02/16/2017   Hypertension 02/16/2017   Hypertensive urgency 02/16/2017    REFERRING PROVIDER: Roanna Piedmont, MD  REFERRING DIAG:  Diagnosis  H81.10 (ICD-10-CM) - Benign paroxysmal vertigo, unspecified ear     THERAPY DIAG:  Dizziness and giddiness  ONSET DATE: 05/27/24  Rationale for Evaluation and Treatment: Rehabilitation  SUBJECTIVE:   SUBJECTIVE STATEMENT: The patient reports that he had dizziness with bending forward that he initially thought was medication related. Then he had one episode (12/31/23) of severe dizziness in sitting and the dizziness got worse with laying down. He reports he called 9-1-1. He had associated nausea and vomiting with that episode. He still gets occasional symptoms that are episodic in nature, but no further severe episodes. He has only needed meclizine  1 time and zofran  1 time. He wonders if dehydration and meds are still contributing to overall symptoms. Pt accompanied by: self  PERTINENT HISTORY: PMH: Atherosclerosis of coronary artery without angina pectoris, PVD, prostate CA, hypercalcemia, anemia, L knee OA, BPH, DMII, CVA, gastric ulcer, lumbar spondylosis, cervical spondlyosis without myelopathy, OSA, HTN, HLD, lumbar spinal stenosis, emphysema, heart murmur, peripheral neuropathy   PAIN:  Are you having pain? Yes: NPRS scale: chronic-- will monitor response to treatment Pain location: chronic low back pain  Pain description: chronic Aggravating factors: chronic- will monitor Relieving factors: chronic-will monitor  PRECAUTIONS: None  WEIGHT BEARING RESTRICTIONS: No  FALLS: Has patient fallen in last 6 months? No  LIVING ENVIRONMENT: Lives with: lives with their family Lives in: House/apartment  PLOF: Independent  PATIENT GOALS: reduce dizziness  OBJECTIVE:  Note: Objective measures were completed at Evaluation unless otherwise noted.  COGNITION: Overall cognitive status: Within functional limits for tasks assessed   Cervical ROM:  WFLs  GAIT: Gait pattern: WFL Distance walked: 75 feet  PATIENT SURVEYS:  Deferred due to HEP only -- do not anticipate ongoing visits  VESTIBULAR ASSESSMENT:   SYMPTOM BEHAVIOR:  Subjective  history: H/o episodic symptoms of lightheadedness worse with bending forward. He has had one severe episode of vertigo.   Non-Vestibular symptoms: nausea/vomiting  Type of dizziness: Spinning/Vertigo and Lightheadedness/Faint  Frequency: intermittent  Duration: seconds   Aggravating factors: Induced by position change: bending forward  Relieving factors: unchangedno known relieving factors  Progression of symptoms:   OCULOMOTOR EXAM:  Ocular Alignment: normal  Ocular ROM: No Limitations  Spontaneous Nystagmus: absent  Gaze-Induced Nystagmus: absent  Smooth Pursuits: intact  Saccades: intact  -VESTIBULAR - OCULAR REFLEX:   Slow VOR: Normal able to maintain gaze at slow speed  VOR Cancellation: Normal  Head-Impulse Test: HIT Right: positive HIT Left: positive mild, low amplitude corrective saccade to target  Dynamic Visual Acuity: TBA   POSITIONAL TESTING: Right Dix-Hallpike: no nystagmus Left Dix-Hallpike: no nystagmus Right Roll Test: no nystagmus Left Roll Test: no nystagmus Right Sidelying Test: No nystagmus Left Sidelying Test: No nystagmus *no dizziness during positional testing  MOTION SENSITIVITY:  Assessed forward bending-- no dizziness or nystagmus.  OTHOSTATICS: Supine x 3 minutes: 138/82 with HR 69 Standing x 1 minute: 150/90 with HR 81; Standing x 3 minutes: 146/88 with HR 78                                                                                                                            [facility] Adult PT Treatment:                                                DATE: 06/09/24 Neuromuscular re-ed: Gaze adaptation Horizontal seated x 30 seconds Horizontal standing x 30 seconds Cues for speed and being able to maintain target Discussed how long to do exercises and vestibular conditions  PATIENT EDUCATION: Education details: HEP Person educated: Patient Education method: Explanation, Demonstration, and Handouts Education comprehension: verbalized  understanding and returned demonstration  HOME EXERCISE PROGRAM: Access Code: [email or web address] URL: [email or web address] Date: 06/09/2024 Prepared by: Tawni Ferrier  Exercises - Standing Gaze Stabilization with Head Rotation  - 2 x daily - 7 x weekly - 1 sets - 2 reps - 30-60seconds hold   GOALS: Goals reviewed with patient? Yes  LONG TERM GOALS: Target date: 07/09/24  The patient will be indep with progression of HEP. Baseline:  initiated at eval Goal status: INITIAL  2.  The patient will tolerate gaze x 1 adaptation x 60 seconds without c/o visual blurring or dizziness. Baseline: Can tolerate 30 seconds with minimal visual blurring Goal status: INITIAL  3.  PT to reassess positional symptoms if indicated Baseline:  no evidence of BPPV today Goal status: INITIAL  ASSESSMENT:  CLINICAL IMPRESSION: Patient is a 73 y.o. male who was seen today for physical therapy evaluation and treatment for intermittent vertigo. At today's evaluation, he has WNLs oculomotor exam and positional testing. His head impulse test is positive for low amplitude refixation saccade indicating diminished use of VOR. PT provided home treatment for VOR with gaze adaptation exercises at today's visit. We also monitored vitals during orthostatic testing with no decrease in BP. PT recommended 1 follow up visit that patient can call to cx if no further symptoms or questions regarding gaze adaptation exercises.    OBJECTIVE IMPAIRMENTS: dizziness.   ACTIVITY LIMITATIONS: bending  PARTICIPATION LIMITATIONS: gardening  PERSONAL FACTORS: 3+ comorbidities: HTN, diabetes, lumbar spondylosis are also affecting patient's functional outcome.   REHAB POTENTIAL: Good  CLINICAL DECISION MAKING: Stable/uncomplicated  EVALUATION COMPLEXITY: Low   PLAN:  PT FREQUENCY: 1x/week  PT DURATION: 4 weeks  PLANNED INTERVENTIONS: [id number]- PT Re-evaluation, 97750- Physical Performance Testing, 97110-Therapeutic  exercises, 97530- Therapeutic activity, [id number]- Neuromuscular re-education, 97535- Self Care, [id number]- Canalith repositioning, Patient/Family education, Balance training, and Vestibular training  PLAN FOR NEXT SESSION: check gaze x 1 viewing, reassess based on patient symptoms since evaluation.   Orazio Weller, PT 06/09/2024, 11:28 AM

## 2024-06-29 ENCOUNTER — Ambulatory Visit: Admitting: Rehabilitative and Restorative Service Providers"

## 2024-07-08 ENCOUNTER — Ambulatory Visit

## 2024-07-08 DIAGNOSIS — Z13820 Encounter for screening for osteoporosis: Secondary | ICD-10-CM

## 2024-07-08 DIAGNOSIS — R5381 Other malaise: Secondary | ICD-10-CM

## 2024-07-24 ENCOUNTER — Other Ambulatory Visit: Payer: Medicare Other

## 2025-01-12 ENCOUNTER — Ambulatory Visit (HOSPITAL_BASED_OUTPATIENT_CLINIC_OR_DEPARTMENT_OTHER): Admitting: Cardiovascular Disease

## 2025-04-08 ENCOUNTER — Ambulatory Visit (HOSPITAL_BASED_OUTPATIENT_CLINIC_OR_DEPARTMENT_OTHER): Admitting: Cardiovascular Disease
